# Patient Record
Sex: Female | Born: 1990 | Race: Black or African American | Hispanic: No | Marital: Single | Smoking: Never smoker
Health system: Southern US, Community
[De-identification: ages and names within clinical notes are randomized; demographics above are authoritative.]

## PROBLEM LIST (undated history)

## (undated) ENCOUNTER — Inpatient Hospital Stay (HOSPITAL_COMMUNITY): Payer: Self-pay

## (undated) ENCOUNTER — Emergency Department (HOSPITAL_COMMUNITY)

## (undated) DIAGNOSIS — G43909 Migraine, unspecified, not intractable, without status migrainosus: Secondary | ICD-10-CM

## (undated) DIAGNOSIS — D649 Anemia, unspecified: Secondary | ICD-10-CM

## (undated) DIAGNOSIS — K219 Gastro-esophageal reflux disease without esophagitis: Secondary | ICD-10-CM

## (undated) DIAGNOSIS — T7840XA Allergy, unspecified, initial encounter: Secondary | ICD-10-CM

## (undated) HISTORY — PX: TONSILLECTOMY: SUR1361

## (undated) HISTORY — DX: Gastro-esophageal reflux disease without esophagitis: K21.9

## (undated) HISTORY — DX: Anemia, unspecified: D64.9

## (undated) HISTORY — DX: Allergy, unspecified, initial encounter: T78.40XA

---

## 2010-05-19 ENCOUNTER — Other Ambulatory Visit: Admission: RE | Admit: 2010-05-19 | Discharge: 2010-05-19 | Payer: Self-pay | Admitting: Family Medicine

## 2010-05-19 ENCOUNTER — Ambulatory Visit: Payer: Self-pay | Admitting: Family Medicine

## 2010-05-19 DIAGNOSIS — D649 Anemia, unspecified: Secondary | ICD-10-CM

## 2010-05-20 LAB — CONVERTED CEMR LAB
AST: 17 units/L (ref 0–37)
Albumin: 4.4 g/dL (ref 3.5–5.2)
BUN: 13 mg/dL (ref 6–23)
Basophils Absolute: 0 10*3/uL (ref 0.0–0.1)
CO2: 25 meq/L (ref 19–32)
Cholesterol: 145 mg/dL (ref 0–200)
Eosinophils Absolute: 0.1 10*3/uL (ref 0.0–0.7)
GFR calc non Af Amer: 100.05 mL/min (ref >60)
Glucose, Bld: 81 mg/dL (ref 70–99)
HCT: 32.8 % — ABNORMAL LOW (ref 36.0–46.0)
HDL: 48.7 mg/dL (ref >39.00)
HIV: NONREACTIVE
Lymphs Abs: 1.9 10*3/uL (ref 0.7–4.0)
MCHC: 34.3 g/dL (ref 30.0–36.0)
Monocytes Absolute: 0.3 10*3/uL (ref 0.1–1.0)
Monocytes Relative: 7.1 % (ref 3.0–12.0)
Platelets: 213 10*3/uL (ref 150.0–400.0)
Potassium: 4 meq/L (ref 3.5–5.1)
RDW: 13 % (ref 11.5–14.6)
RPR Ser Ql: REACTIVE — AB
RPR Titer: 1:2 {titer}
T pallidum Antibodies (TP-PA): 0.04 (ref <0.90)
TSH: 1.25 microintl units/mL (ref 0.35–5.50)
Total Bilirubin: 0.6 mg/dL (ref 0.3–1.2)
VLDL: 12.8 mg/dL (ref 0.0–40.0)

## 2010-05-23 ENCOUNTER — Telehealth (INDEPENDENT_AMBULATORY_CARE_PROVIDER_SITE_OTHER): Payer: Self-pay | Admitting: *Deleted

## 2010-06-01 ENCOUNTER — Telehealth (INDEPENDENT_AMBULATORY_CARE_PROVIDER_SITE_OTHER): Payer: Self-pay | Admitting: *Deleted

## 2010-06-06 ENCOUNTER — Encounter: Payer: Self-pay | Admitting: Family Medicine

## 2010-08-01 ENCOUNTER — Encounter: Payer: Self-pay | Admitting: Family Medicine

## 2010-08-01 ENCOUNTER — Ambulatory Visit
Admission: RE | Admit: 2010-08-01 | Discharge: 2010-08-01 | Payer: Self-pay | Source: Home / Self Care | Attending: Family Medicine | Admitting: Family Medicine

## 2010-08-01 DIAGNOSIS — B359 Dermatophytosis, unspecified: Secondary | ICD-10-CM | POA: Insufficient documentation

## 2010-08-01 DIAGNOSIS — N760 Acute vaginitis: Secondary | ICD-10-CM | POA: Insufficient documentation

## 2010-08-02 LAB — CONVERTED CEMR LAB: GC Probe Amp, Genital: NEGATIVE

## 2010-08-02 NOTE — Progress Notes (Signed)
Summary: pap results  Phone Note Outgoing Call   Call placed by: Doristine Devoid CMA,  June 01, 2010 3:06 PM Call placed to: Patient Summary of Call: pap normal but she has chlamydia.  needs to take Azithromycin 1 gram (2 500mg  pills) x1 dose.  partner needs treatment and should refrain from sex for 7 days during tx.  Follow-up for Phone Call        left message on machine ........Marland KitchenDoristine Devoid CMA  June 01, 2010 3:06 PM   left message on machine .........Marland KitchenDoristine Devoid CMA  June 02, 2010 4:12 PM   spoke w/ patient aware of pap results ..........Marland KitchenDoristine Devoid CMA  June 06, 2010 1:20 PM     New/Updated Medications: AZITHROMYCIN 500 MG TABS (AZITHROMYCIN) take 2 tablets daily Prescriptions: AZITHROMYCIN 500 MG TABS (AZITHROMYCIN) take 2 tablets daily  #2 x 0   Entered by:   Doristine Devoid CMA   Authorized by:   Neena Rhymes MD   Signed by:   Doristine Devoid CMA on 06/06/2010   Method used:   Electronically to        [facility] [phone]* (retail)       [address]       [place], [place]  [address]       Ph: [phone]       Fax: [phone]   RxID:   [id number]

## 2010-08-02 NOTE — Assessment & Plan Note (Signed)
Summary: NEW TO ESTAB/CBS   Vital Signs:  Patient profile:   20 year old female Height:      68 inches Weight:      145 pounds BMI:     22.13 Pulse rate:   79 / minute BP sitting:   104 / 60  (left arm)  Vitals Entered By: Doristine Devoid CMA (May 19, 2010 10:03 AM) CC: NEW EST- CPX AND PAP   History of Present Illness: 20 yo woman here today to establish care.  previous MD- Vilinda Boehringer.  no concerns today.  would like to switch OCPs due to breakthrough spotting.  Preventive Screening-Counseling & Management  Alcohol-Tobacco     Alcohol drinks/day: 0     Smoking Status: never  Caffeine-Diet-Exercise     Does Patient Exercise: no      Drug Use:  never.    Current Medications (verified): 1)  Sprintec 28 0.25-35 Mg-Mcg Tabs (Norgestimate-Eth Estradiol) .Marland Kitchen.. 1 Tab By Mouth Daily  Allergies (verified): 1)  ! Sulfa  Past History:  Past Medical History: Anemia-NOS  Past Surgical History: none  Family History: CAD-no HTN-no DM-grandparent STROKE COLON CA-no BREAST CA-grandmother dx'd late 35s.  Social History: student at [employer]- [place] lives aloneSmoking Status:  never Does Patient Exercise:  no Drug Use:  never  Review of Systems       The patient complains of headaches.  The patient denies anorexia, fever, weight loss, weight gain, vision loss, decreased hearing, hoarseness, chest pain, syncope, dyspnea on exertion, peripheral edema, prolonged cough, abdominal pain, melena, hematochezia, severe indigestion/heartburn, hematuria, suspicious skin lesions, depression, abnormal bleeding, enlarged lymph nodes, and breast masses.         HAs- pt feels this is stress related, also has allergies  Physical Exam  General:  Well-developed,well-nourished,in no acute distress; alert,appropriate and cooperative throughout examination Head:  Normocephalic and atraumatic without obvious abnormalities. No apparent alopecia or balding. Eyes:   No corneal or conjunctival inflammation noted. EOMI. Perrla. Funduscopic exam benign, without hemorrhages, exudates or papilledema. Vision grossly normal. Ears:  External ear exam shows no significant lesions or deformities.  Otoscopic examination reveals clear canals, tympanic membranes are intact bilaterally without bulging, retraction, inflammation or discharge. Hearing is grossly normal bilaterally. Nose:  + allergic crease w/ markedly edematous turbinates Mouth:  Oral mucosa and oropharynx without lesions or exudates.  Teeth in good repair. Neck:  No deformities, masses, or tenderness noted. Breasts:  No mass, nodules, thickening, tenderness, bulging, retraction, inflamation, nipple discharge or skin changes noted.   Lungs:  Normal respiratory effort, chest expands symmetrically. Lungs are clear to auscultation, no crackles or wheezes. Heart:  Normal rate and regular rhythm. S1 and S2 normal without gallop, murmur, click, rub or other extra sounds. Abdomen:  Bowel sounds positive,abdomen soft and non-tender without masses, organomegaly or hernias noted. Genitalia:  Pelvic Exam:        External: normal female genitalia without lesions or masses        Vagina: normal without lesions or masses        Cervix: normal without lesions or masses        Adnexa: normal bimanual exam without masses or fullness        Uterus: normal by palpation        Pap smear: performed Pulses:  +2 carotid, radial, DP Extremities:  No clubbing, cyanosis, edema, or deformity noted with normal full range of motion of all joints.   Neurologic:  No cranial nerve deficits noted.  Station and gait are normal. Plantar reflexes are down-going bilaterally. DTRs are symmetrical throughout. Sensory, motor and coordinative functions appear intact. Skin:  Intact without suspicious lesions or rashes Cervical Nodes:  No lymphadenopathy noted Axillary Nodes:  No palpable lymphadenopathy Psych:  Cognition and judgment appear intact.  Alert and cooperative with normal attention span and concentration. No apparent delusions, illusions, hallucinations   Impression & Recommendations:  Problem # 1:  ROUTINE GYNECOLOGICAL EXAMINATION (ICD-V72.31) Assessment New pt's PE WNL.  check labs.  anticipatory guidance provided. Orders: Venipuncture ([address]) TLB-Lipid Panel (80061-LIPID) TLB-BMP (Basic Metabolic Panel-BMET) (80048-METABOL) TLB-CBC Platelet - w/Differential (85025-CBCD) TLB-Hepatic/Liver Function Pnl (80076-HEPATIC) TLB-TSH (Thyroid Stimulating Hormone) (84443-TSH) T-Vitamin D (25-Hydroxy) ([address]-09811) Specimen Handling (91478)  Problem # 2:  SCREENING FOR MALIGNANT NEOPLASM OF THE CERVIX (ICD-V76.2) Assessment: New pap collected.  Problem # 3:  CONTACT OR EXPOSURE TO OTHER VIRAL DISEASES (ICD-V01.79) Assessment: New check labs at pt's request.  stressed the importance of safe sex Orders: T-RPR (Syphilis) (29562-13086) T-HIV Antibody  (Reflex) ([id number]) Specimen Handling (28413)  Complete Medication List: 1)  Sprintec 28 0.25-35 Mg-mcg Tabs (Norgestimate-eth estradiol) .Marland Kitchen.. 1 tab by mouth daily  Patient Instructions: 1)  Follow up in 1 year or as needed 2)  Your exam looks great!  Keep up the good work! 3)  We'll notify you of your lab results 4)  Start Zyrtec (or store brand generic) daily for your allergy congestion 5)  Call with any questions or concerns 6)  Happy Holidays!! Prescriptions: SPRINTEC 28 0.25-35 MG-MCG TABS (NORGESTIMATE-ETH ESTRADIOL) 1 tab by mouth daily  #1 pack x 11   Entered and Authorized by:   Neena Rhymes MD   Signed by:   Neena Rhymes MD on 05/19/2010   Method used:   Electronically to        [facility]. [phone]* (retail)       [address]       [place], [place]  [address]       Ph: [phone]       Fax: [phone]   RxID:   [id number]    Orders Added: 1)  Venipuncture [01601] 2)  TLB-Lipid Panel  [80061-LIPID] 3)  TLB-BMP (Basic Metabolic Panel-BMET) [80048-METABOL] 4)  TLB-CBC Platelet - w/Differential [85025-CBCD] 5)  TLB-Hepatic/Liver Function Pnl [80076-HEPATIC] 6)  TLB-TSH (Thyroid Stimulating Hormone) [84443-TSH] 7)  T-Vitamin D (25-Hydroxy) [09323-55732] 8)  T-RPR (Syphilis) [20254-27062] 9)  T-HIV Antibody  (Reflex) [[id number]] 10)  Specimen Handling [99000] 11)  New Patient 18-39 years [99385]

## 2010-08-02 NOTE — Progress Notes (Signed)
Summary: labs  Phone Note Outgoing Call   Call placed by: Doristine Devoid CMA,  May 23, 2010 3:11 PM Call placed to: Patient Summary of Call: vit D level is low.  needs to start 50,000 units weekly x12 weeks and then recheck level.  also needs to start daily caltrate + D supplement  Follow-up for Phone Call        spoke w/ patient aware of labs and that prescription to be called into pharmacy .......Marland KitchenDoristine Devoid CMA  May 23, 2010 3:12 PM     New/Updated Medications: VITAMIN D (ERGOCALCIFEROL) 50000 UNIT CAPS (ERGOCALCIFEROL) take one tablet daily x12 weeks Prescriptions: VITAMIN D (ERGOCALCIFEROL) 50000 UNIT CAPS (ERGOCALCIFEROL) take one tablet daily x12 weeks  #12 x 0   Entered by:   Doristine Devoid CMA   Authorized by:   Neena Rhymes MD   Signed by:   Doristine Devoid CMA on 05/23/2010   Method used:   Electronically to        [facility]. [phone]* (retail)       [address]       [place], [place]  [address]       Ph: [phone]       Fax: [phone]   RxID:   [id number]

## 2010-08-03 ENCOUNTER — Telehealth (INDEPENDENT_AMBULATORY_CARE_PROVIDER_SITE_OTHER): Payer: Self-pay | Admitting: *Deleted

## 2010-08-04 NOTE — Letter (Signed)
Summary: Communicable Disease Form/[facility]  Communicable Disease Form/[facility]   Imported By: Lanelle Bal 06/13/2010 12:19:29  _____________________________________________________________________  External Attachment:    Type:   Image     Comment:   External Document

## 2010-08-10 NOTE — Progress Notes (Signed)
Summary: wet prep results  Phone Note Outgoing Call   Call placed by: Doristine Devoid CMA,  August 03, 2010 8:57 AM Call placed to: Patient Summary of Call: + for BV.  needs to start Metronidazole 500mg  two times a day x7 days.  this is NOT sexually transmitted but is the cause of her vaginal d/c    Follow-up for Phone Call        Left message to call office .Marland KitchenMarland KitchenMarland KitchenSigned by Jeremy Johann CMA on 08/02/2010 at 5:12 PM  spoke w/ patient aware of results...Marland KitchenMarland KitchenDoristine Devoid CMA  August 03, 2010 9:44 AM     New/Updated Medications: METRONIDAZOLE 500 MG TABS (METRONIDAZOLE) take one tablet two times a day x7 days Prescriptions: METRONIDAZOLE 500 MG TABS (METRONIDAZOLE) take one tablet two times a day x7 days  #14 x 0   Entered by:   Doristine Devoid CMA   Authorized by:   Neena Rhymes MD   Signed by:   Doristine Devoid CMA on 08/03/2010   Method used:   Electronically to        [facility]. [phone]* (retail)       [address]       [place], [place]  [address]       Ph: [phone]       Fax: [phone]   RxID:   [id number]

## 2010-08-10 NOTE — Assessment & Plan Note (Signed)
Summary: rash on arms, back and stomach///sph   Vital Signs:  Patient profile:   20 year old female Weight:      142 pounds BMI:     21.67 Temp:     98.2 degrees F oral BP sitting:   110 / 60  (left arm)  Vitals Entered By: Doristine Devoid CMA (August 01, 2010 2:08 PM) CC: rash on arms and back also having vaginal discharge     History of Present Illness: 20 yo woman here today for  1) rash- has papular rash on both arms, back, abd.  + itching- using cortizone w/ some relief.  none on legs or groin.  none on hands or feet.  no changes in soaps or detergents.  no animal exposure.  sxs appeared 1 week ago.  has not slept anywhere new or different.  no hx of similar  2) vaginal d/c- discharge is Sessa, 'gooey' egg Floyd consistency.  pt is at midpoint of cycle.  no itching or burning.  discharge is intermittant x2 weeks.  would like checked for STDs- has + hx.  Current Medications (verified): 1)  Sprintec 28 0.25-35 Mg-Mcg Tabs (Norgestimate-Eth Estradiol) .Marland Kitchen.. 1 Tab By Mouth Daily 2)  Vitamin D (Ergocalciferol) 50000 Unit Caps (Ergocalciferol) .... Take One Tablet Daily X12 Weeks  Allergies (verified): 1)  ! Sulfa  Review of Systems      See HPI  Physical Exam  General:  Well-developed,well-nourished,in no acute distress; alert,appropriate and cooperative throughout examination Genitalia:  normal introitus and no external lesions, Severns vaginal d/c, inflammed appearing cervix.   Skin:  circular lesions consistent w/ tinea on trunk and bilateral arms   Impression & Recommendations:  Problem # 1:  RINGWORM (ICD-110.9) Assessment New pt's rash consistent w/ ringworm.  start OTC antifungal two times a day.  Problem # 2:  VAGINITIS (ICD-616.10) Assessment: New check GC/CT probe and wet prep.  will hold tx until results available. The following medications were removed from the medication list:    Azithromycin 500 Mg Tabs (Azithromycin) .Marland Kitchen... Take 2 tablets  daily  Orders: Specimen Handling ([address]) T-Chlamydia Probe, genital [phone]) T-GC Probe, genital [phone]) T-Wet Prep (13086-57846)  Complete Medication List: 1)  Sprintec 28 0.25-35 Mg-mcg Tabs (Norgestimate-eth estradiol) .Marland Kitchen.. 1 tab by mouth daily 2)  Vitamin D (ergocalciferol) 50000 Unit Caps (Ergocalciferol) .... Take one tablet daily x12 weeks  Patient Instructions: 1)  Your rash appears to be a fungal infection- it's hard to say where you got it but we can treat it w/ OTC Clotrimazole cream two times a day 2)  We'll notify you of your lab results and treat if needed 3)  Make sure you are practicing safe sex! 4)  Call with any questions or concerns 5)  Happy New Year!   Orders Added: 1)  Specimen Handling [99000] 2)  T-Chlamydia Probe, genital [96295-28413] 3)  T-GC Probe, genital [24401-02725] 4)  T-Wet Prep [36644-03474] 5)  Est. Patient Level III [25956]

## 2010-12-01 ENCOUNTER — Encounter: Payer: Self-pay | Admitting: Family Medicine

## 2010-12-01 ENCOUNTER — Ambulatory Visit (INDEPENDENT_AMBULATORY_CARE_PROVIDER_SITE_OTHER): Payer: Managed Care, Other (non HMO) | Admitting: Family Medicine

## 2010-12-01 DIAGNOSIS — N760 Acute vaginitis: Secondary | ICD-10-CM

## 2010-12-01 DIAGNOSIS — Z20828 Contact with and (suspected) exposure to other viral communicable diseases: Secondary | ICD-10-CM | POA: Insufficient documentation

## 2010-12-01 DIAGNOSIS — F419 Anxiety disorder, unspecified: Secondary | ICD-10-CM | POA: Insufficient documentation

## 2010-12-01 DIAGNOSIS — F411 Generalized anxiety disorder: Secondary | ICD-10-CM

## 2010-12-01 MED ORDER — FLUCONAZOLE 150 MG PO TABS: 150.0000 mg | ORAL_TABLET | Freq: Once | ORAL | Status: DC

## 2010-12-01 MED ORDER — BUSPIRONE HCL 15 MG PO TABS: 15.0000 mg | ORAL_TABLET | Freq: Two times a day (BID) | ORAL | Status: DC

## 2010-12-01 NOTE — Progress Notes (Signed)
  Subjective:    Patient ID: Connie Cobb, female    DOB: May 07, 1991, 20 y.o.   MRN: [id number]  HPI  Anxiety- is currently in nursing school.  Legs will shake.  Having poor sleep, difficulty falling asleep- 'can't turn my brain off'.  Has never had anxiety previously.  Denies sadness, crying.  Feels 'tired a lot'.  Vaginal d/c- reports this is similar to last time.  Last time had BV.  Took OTC pH test and this reported pH is normal.  Reports d/c is 'milky Judson'.  Denies itching.  Would like STD testing  Review of Systems For ROS see HPI     Objective:   Physical Exam  Constitutional: She appears well-developed and well-nourished.       Mildly anxious  Neck: Neck supple. No thyromegaly present.  Genitourinary: There is no rash, tenderness or lesion on the right labia. There is no rash, tenderness or lesion on the left labia. Uterus is not tender. Cervix exhibits no motion tenderness, no discharge and no friability. Right adnexum displays no tenderness. Left adnexum displays no tenderness. Vaginal discharge (thick, [place], clumping- sticking to vaginal walls (appears consistent w/ yeast)) found.  Psychiatric:       Anxious, restless, trouble making eye contact at times          Assessment & Plan:

## 2010-12-01 NOTE — Patient Instructions (Signed)
Follow up in 1 month to recheck anxiety Start the Buspar twice a day- start w/ 1/2 tab twice a day for 2 weeks and then increase to 1 tab twice daily Take the Diflucan for suspected yeast- we'll notify you of your lab results Call with any questions or concerns Hang in there!

## 2010-12-02 ENCOUNTER — Encounter: Payer: Self-pay | Admitting: *Deleted

## 2010-12-02 LAB — WET PREP BY MOLECULAR PROBE
Candida species: NEGATIVE
Trichomonas vaginosis: NEGATIVE

## 2010-12-02 LAB — GC/CHLAMYDIA PROBE AMP, GENITAL
Chlamydia, DNA Probe: NEGATIVE
GC Probe Amp, Genital: NEGATIVE

## 2010-12-04 NOTE — Assessment & Plan Note (Signed)
Check labs at pt's request.  Pt reports using condoms w/ each encounter but is very concerned about this.

## 2010-12-04 NOTE — Assessment & Plan Note (Signed)
Pt appears anxious in office today and admits to being overwhelmed w/ school.  Not sleeping well.  Will start buspar for anxiety and follow closely.  Pt expressed understanding and is in agreement w/ plan.

## 2010-12-04 NOTE — Assessment & Plan Note (Signed)
Pt's vaginal dc appears consistent w/ yeast- start diflucan while waiting on wet prep.

## 2010-12-27 ENCOUNTER — Ambulatory Visit (INDEPENDENT_AMBULATORY_CARE_PROVIDER_SITE_OTHER): Payer: Managed Care, Other (non HMO) | Admitting: Family Medicine

## 2010-12-27 DIAGNOSIS — F419 Anxiety disorder, unspecified: Secondary | ICD-10-CM

## 2010-12-27 DIAGNOSIS — N760 Acute vaginitis: Secondary | ICD-10-CM

## 2010-12-27 DIAGNOSIS — F411 Generalized anxiety disorder: Secondary | ICD-10-CM

## 2010-12-27 MED ORDER — FLUCONAZOLE 150 MG PO TABS: 150.0000 mg | ORAL_TABLET | Freq: Once | ORAL | Status: DC

## 2010-12-27 NOTE — Patient Instructions (Signed)
This appears to be yeast Take the Diflucan for the yeast and then repeat in 3 days if you still have symptoms Call with any questions or concerns Have a great summer!

## 2010-12-27 NOTE — Progress Notes (Signed)
  Subjective:    Patient ID: Connie Cobb, female    DOB: October 06, 1990, 20 y.o.   MRN: [id number]  HPI Vaginal discharge- reports discharge returned 3 days ago.  Now appears like 'rolled up toilet paper balls'.  Denies odor, itching.  Has some vaginal dryness.  Due for period next week.  Anxiety- sxs have improved since starting Buspar.  Sleeping better.  Now feels able to manage her stress- 'way better than I was before'.   Review of Systems For ROS see HPI     Objective:   Physical Exam  Constitutional: She appears well-developed and well-nourished. No distress.  Genitourinary: There is no rash or tenderness on the right labia. There is no rash or tenderness on the left labia. Cervix exhibits no motion tenderness, no discharge and no friability. No erythema, tenderness or bleeding around the vagina. Vaginal discharge (thick, clumpy [place] d/c) found.  Psychiatric: She has a normal mood and affect. Her behavior is normal. Judgment and thought content normal.          Assessment & Plan:

## 2010-12-27 NOTE — Assessment & Plan Note (Signed)
Improved since starting Buspar.  Will continue.

## 2010-12-27 NOTE — Assessment & Plan Note (Signed)
Pt's PE consistent w/ yeast.  Send wet prep for confirmation.  tx w/ Diflucan.

## 2010-12-28 LAB — WET PREP BY MOLECULAR PROBE: Gardnerella vaginalis: NEGATIVE

## 2011-01-26 ENCOUNTER — Telehealth: Payer: Self-pay | Admitting: Family Medicine

## 2011-01-26 MED ORDER — FLUCONAZOLE 150 MG PO TABS: 150.0000 mg | ORAL_TABLET | Freq: Once | ORAL | Status: AC

## 2011-01-26 NOTE — Telephone Encounter (Signed)
Pt called was seen for yeast infection on 6/26 and says that symptoms have returned and would like to have another rx for diflucan.

## 2011-01-26 NOTE — Telephone Encounter (Signed)
Ok to give 2 pills

## 2011-01-26 NOTE — Telephone Encounter (Signed)
Pt.notified

## 2011-03-27 ENCOUNTER — Ambulatory Visit (INDEPENDENT_AMBULATORY_CARE_PROVIDER_SITE_OTHER): Payer: Managed Care, Other (non HMO) | Admitting: Family Medicine

## 2011-03-27 ENCOUNTER — Encounter: Payer: Self-pay | Admitting: Family Medicine

## 2011-03-27 DIAGNOSIS — Z23 Encounter for immunization: Secondary | ICD-10-CM

## 2011-03-27 DIAGNOSIS — N760 Acute vaginitis: Secondary | ICD-10-CM

## 2011-03-27 MED ORDER — NORGESTIMATE-ETH ESTRADIOL 0.25-35 MG-MCG PO TABS
1.0000 | ORAL_TABLET | Freq: Every day | ORAL | Status: DC
Start: 2011-03-27 — End: 2011-05-01

## 2011-03-27 NOTE — Patient Instructions (Signed)
We'll notify you of your lab results and treat as needed Call with any questions or concerns Your period is starting- be prepared Hang in there!

## 2011-03-27 NOTE — Progress Notes (Signed)
  Subjective:    Patient ID: Connie Cobb, female    DOB: [birth year], 20 y.o.   MRN: [id number]  HPI Vaginal d/c- reports ongoing discharge.  Thick, 'milky' d/c.  No itching, no pain.  Due for period.  Needs flu shot   Review of Systems For ROS see HPI     Objective:   Physical Exam  Vitals reviewed. Constitutional: She appears well-developed and well-nourished. No distress.  Genitourinary: No erythema or tenderness around the vagina. Vaginal discharge (Blowers, thick, no odor) found.          Assessment & Plan:

## 2011-03-28 NOTE — Assessment & Plan Note (Signed)
Pt again having vaginal d/c.  Will await results of wet prep prior to treating.  Pt expressed understanding and is in agreement w/ plan.

## 2011-03-31 ENCOUNTER — Telehealth: Payer: Self-pay

## 2011-03-31 NOTE — Telephone Encounter (Signed)
Pt notified vaginal swab negative for infection

## 2011-05-01 ENCOUNTER — Other Ambulatory Visit: Payer: Self-pay | Admitting: Family Medicine

## 2011-05-01 MED ORDER — NORGESTIMATE-ETH ESTRADIOL 0.25-35 MG-MCG PO TABS: 1.0000 | ORAL_TABLET | Freq: Every day | ORAL | Status: DC

## 2011-05-01 NOTE — Telephone Encounter (Signed)
rx sent to pharmacy by e-script  

## 2011-05-22 ENCOUNTER — Ambulatory Visit (INDEPENDENT_AMBULATORY_CARE_PROVIDER_SITE_OTHER): Payer: Managed Care, Other (non HMO) | Admitting: Family Medicine

## 2011-05-22 ENCOUNTER — Other Ambulatory Visit (HOSPITAL_COMMUNITY)
Admission: RE | Admit: 2011-05-22 | Discharge: 2011-05-22 | Disposition: A | Discharge status: Home / Self Care | Payer: Managed Care, Other (non HMO) | Source: Ambulatory Visit | Attending: Family Medicine | Admitting: Family Medicine

## 2011-05-22 ENCOUNTER — Encounter: Payer: Self-pay | Admitting: Family Medicine

## 2011-05-22 DIAGNOSIS — Z803 Family history of malignant neoplasm of breast: Secondary | ICD-10-CM | POA: Insufficient documentation

## 2011-05-22 DIAGNOSIS — Z20828 Contact with and (suspected) exposure to other viral communicable diseases: Secondary | ICD-10-CM

## 2011-05-22 DIAGNOSIS — N760 Acute vaginitis: Secondary | ICD-10-CM | POA: Insufficient documentation

## 2011-05-22 DIAGNOSIS — Z113 Encounter for screening for infections with a predominantly sexual mode of transmission: Secondary | ICD-10-CM | POA: Insufficient documentation

## 2011-05-22 DIAGNOSIS — Z01419 Encounter for gynecological examination (general) (routine) without abnormal findings: Secondary | ICD-10-CM | POA: Insufficient documentation

## 2011-05-22 DIAGNOSIS — R87619 Unspecified abnormal cytological findings in specimens from cervix uteri: Secondary | ICD-10-CM

## 2011-05-22 DIAGNOSIS — Z124 Encounter for screening for malignant neoplasm of cervix: Secondary | ICD-10-CM | POA: Insufficient documentation

## 2011-05-22 LAB — CBC WITH DIFFERENTIAL/PLATELET
Basophils Absolute: 0 10*3/uL (ref 0.0–0.1)
HCT: 35 % — ABNORMAL LOW (ref 36.0–46.0)
Hemoglobin: 11.9 g/dL — ABNORMAL LOW (ref 12.0–15.0)
Lymphs Abs: 1.4 10*3/uL (ref 0.7–4.0)
MCHC: 33.9 g/dL (ref 30.0–36.0)
MCV: 84.3 fl (ref 78.0–100.0)
Monocytes Absolute: 0.3 10*3/uL (ref 0.1–1.0)
Monocytes Relative: 6.5 % (ref 3.0–12.0)
Neutro Abs: 2.7 10*3/uL (ref 1.4–7.7)
Platelets: 230 10*3/uL (ref 150.0–400.0)
RDW: 13.7 % (ref 11.5–14.6)

## 2011-05-22 LAB — LIPID PANEL
HDL: 53.1 mg/dL (ref >39.00)
Triglycerides: 70 mg/dL (ref 0.0–149.0)

## 2011-05-22 LAB — HEPATIC FUNCTION PANEL
Albumin: 4.3 g/dL (ref 3.5–5.2)
Bilirubin, Direct: 0 mg/dL (ref 0.0–0.3)
Total Protein: 7.3 g/dL (ref 6.0–8.3)

## 2011-05-22 LAB — BASIC METABOLIC PANEL
CO2: 24 mEq/L (ref 19–32)
Calcium: 9.3 mg/dL (ref 8.4–10.5)
Creatinine, Ser: 0.8 mg/dL (ref 0.4–1.2)
GFR: 93.51 mL/min (ref >60.00)
Sodium: 141 mEq/L (ref 135–145)

## 2011-05-22 NOTE — Assessment & Plan Note (Signed)
Pap collected. 

## 2011-05-22 NOTE — Assessment & Plan Note (Signed)
Given pt's family hx- maternal grandmother, multiple aunts, few cousins (youngest dx'd in early 36s)- will refer for screening mammo.  Pt expressed understanding and is in agreement w/ plan.

## 2011-05-22 NOTE — Progress Notes (Signed)
  Subjective:    Patient ID: Connie Cobb, female    DOB: 19-Nov-1990, 20 y.o.   MRN: [id number]  HPI CPE- menses started on 11th, having intermittent bleeding.  Missed pill on Saturday and period started.   Review of Systems Patient reports no vision/ hearing changes, adenopathy,fever, weight change,  persistant/recurrent hoarseness , swallowing issues, chest pain, palpitations, edema, persistant/recurrent cough, hemoptysis, dyspnea (rest/exertional/paroxysmal nocturnal), gastrointestinal bleeding (melena, rectal bleeding), abdominal pain, significant heartburn, bowel changes, GU symptoms (dysuria, hematuria, incontinence), Gyn symptoms (abnormal  bleeding, pain),  syncope, focal weakness, memory loss, numbness & tingling, skin/hair/nail changes, abnormal bruising or bleeding, anxiety, or depression.     Objective:   Physical Exam  General Appearance:    Alert, cooperative, no distress, appears stated age  Head:    Normocephalic, without obvious abnormality, atraumatic  Eyes:    PERRL, conjunctiva/corneas clear, EOM's intact, fundi    benign, both eyes  Ears:    Normal TM's and external ear canals, both ears  Nose:   Nares normal, septum midline, mucosa normal, no drainage    or sinus tenderness  Throat:   Lips, mucosa, and tongue normal; teeth and gums normal  Neck:   Supple, symmetrical, trachea midline, no adenopathy;    Thyroid: no enlargement/tenderness/nodules  Back:     Symmetric, no curvature, ROM normal, no CVA tenderness  Lungs:     Clear to auscultation bilaterally, respirations unlabored  Chest Wall:    No tenderness or deformity   Heart:    Regular rate and rhythm, S1 and S2 normal, no murmur, rub   or gallop  Breast Exam:    No tenderness, masses, or nipple abnormality  Abdomen:     Soft, non-tender, bowel sounds active all four quadrants,    no masses, no organomegaly  Genitalia:    External genitalia normal, cervix normal in appearance, no CMT, uterus in normal size and  position, adnexa w/out mass or tenderness, mucosa pink and moist, no lesions or discharge present  Rectal:    Normal external appearance  Extremities:   Extremities normal, atraumatic, no cyanosis or edema  Pulses:   2+ and symmetric all extremities  Skin:   Skin color, texture, turgor normal, no rashes or lesions  Lymph nodes:   Cervical, supraclavicular, and axillary nodes normal  Neurologic:   CNII-XII intact, normal strength, sensation and reflexes    throughout          Assessment & Plan:

## 2011-05-22 NOTE — Patient Instructions (Signed)
Follow up in 1 year or as needed We'll notify you of your lab results Keep up the good work on diet and exercise- you look great Restart your pills on Sunday as usual Call with any questions or concerns Happy Holidays!

## 2011-05-22 NOTE — Assessment & Plan Note (Signed)
Check labs at pt's request 

## 2011-05-22 NOTE — Assessment & Plan Note (Signed)
Pt's PE WNL.  Check labs.  Anticipatory guidance provided.  

## 2011-05-23 LAB — T.PALLIDUM AB, TOTAL: T pallidum Antibodies (TP-PA): 0.03 S/CO (ref <0.90)

## 2011-05-23 LAB — HIV ANTIBODY (ROUTINE TESTING W REFLEX): HIV: NONREACTIVE

## 2011-05-23 LAB — RPR: RPR Ser Ql: REACTIVE — AB

## 2011-05-24 LAB — VITAMIN D 1,25 DIHYDROXY
Vitamin D 1, 25 (OH)2 Total: 81 pg/mL — ABNORMAL HIGH (ref 18–72)
Vitamin D2 1, 25 (OH)2: 11 pg/mL

## 2011-05-30 ENCOUNTER — Encounter: Payer: Self-pay | Admitting: *Deleted

## 2011-05-30 ENCOUNTER — Telehealth: Payer: Self-pay | Admitting: *Deleted

## 2011-05-30 NOTE — Progress Notes (Signed)
Addended by: Derry Lory A on: 05/30/2011 01:22 PM   Modules accepted: Orders

## 2011-05-30 NOTE — Telephone Encounter (Signed)
Spoke to pt about results of Vit D pt understood. Advised that she has an abnormal pap smear and does not have a GYN. I placed an order for a referral to a gyn for pt, advised that someone will be calling for her appt for gyn. Pt still concerned that her flu shot gave her a false positive for the syphillis. I advised that MD Beverely Low doubted that the flu shot will effect the results. Pt understood. Mailed letter will all results/instructions for the Vit D/Pap Smear and Blood Work to her home address.

## 2011-06-08 ENCOUNTER — Other Ambulatory Visit: Payer: Self-pay | Admitting: Obstetrics and Gynecology

## 2011-08-16 ENCOUNTER — Other Ambulatory Visit: Payer: Self-pay | Admitting: Family Medicine

## 2011-08-17 MED ORDER — NORGESTIMATE-ETH ESTRADIOL 0.25-35 MG-MCG PO TABS: 1.0000 | ORAL_TABLET | Freq: Every day | ORAL | Status: DC

## 2011-08-17 NOTE — Telephone Encounter (Signed)
rx sent to pharmacy by e-script  

## 2011-08-21 ENCOUNTER — Encounter: Payer: Self-pay | Admitting: Family Medicine

## 2011-08-21 ENCOUNTER — Ambulatory Visit (INDEPENDENT_AMBULATORY_CARE_PROVIDER_SITE_OTHER): Payer: BC Managed Care – PPO | Admitting: Family Medicine

## 2011-08-21 VITALS — BP 110/80 | HR 110 | Temp 98.5°F | Ht 67.5 in | Wt 142.2 lb

## 2011-08-21 DIAGNOSIS — F419 Anxiety disorder, unspecified: Secondary | ICD-10-CM

## 2011-08-21 DIAGNOSIS — J329 Chronic sinusitis, unspecified: Secondary | ICD-10-CM | POA: Insufficient documentation

## 2011-08-21 DIAGNOSIS — J309 Allergic rhinitis, unspecified: Secondary | ICD-10-CM

## 2011-08-21 DIAGNOSIS — J302 Other seasonal allergic rhinitis: Secondary | ICD-10-CM | POA: Insufficient documentation

## 2011-08-21 DIAGNOSIS — R350 Frequency of micturition: Secondary | ICD-10-CM

## 2011-08-21 DIAGNOSIS — N390 Urinary tract infection, site not specified: Secondary | ICD-10-CM

## 2011-08-21 DIAGNOSIS — F411 Generalized anxiety disorder: Secondary | ICD-10-CM

## 2011-08-21 DIAGNOSIS — IMO0001 Reserved for inherently not codable concepts without codable children: Secondary | ICD-10-CM

## 2011-08-21 LAB — POCT URINALYSIS DIPSTICK
Bilirubin, UA: NEGATIVE
Glucose, UA: NEGATIVE
Ketones, UA: NEGATIVE
Leukocytes, UA: NEGATIVE
Spec Grav, UA: 1.02

## 2011-08-21 MED ORDER — CITALOPRAM HYDROBROMIDE 20 MG PO TABS: 20.0000 mg | ORAL_TABLET | Freq: Every day | ORAL | Status: DC

## 2011-08-21 MED ORDER — MOMETASONE FUROATE 50 MCG/ACT NA SUSP: 2.0000 | Freq: Every day | NASAL | Status: DC

## 2011-08-21 MED ORDER — CIPROFLOXACIN HCL 500 MG PO TABS: 500.0000 mg | ORAL_TABLET | Freq: Two times a day (BID) | ORAL | Status: AC

## 2011-08-21 NOTE — Assessment & Plan Note (Signed)
Pt's sxs are consistent w/ UTI althought UA unremarkable.  Send for cx.  Start abx.  Reviewed supportive care and red flags that should prompt return.  Pt expressed understanding and is in agreement w/ plan.

## 2011-08-21 NOTE — Progress Notes (Signed)
  Subjective:    Patient ID: Connie Cobb, female    DOB: 1990/08/14, 21 y.o.   MRN: [id number]  HPI Urinary urgency- sxs started last week.  Felt the need to 'constantly' go the bathroom but would attempt to urinate and only 'get a drop'.  Urine had 'funny smell'.  No dysuria.  No fevers.  No back or bladder pain.  HA- will wake up w/ HAs, will improve w/ Excedrin Migraine.  sxs started in Jan.  HAs are frontal.  No phonophobia.  Minimal photophobia.  No nausea.  No ear pain.  + nasal congestion.  No tooth pain.  Anxiety- 'i had a little mental breakdown yesterday'.  buspar is causing dizziness but sxs are getting worse.   Review of Systems For ROS see HPI     Objective:   Physical Exam  Vitals reviewed. Constitutional: She appears well-developed and well-nourished. No distress.  HENT:  Head: Normocephalic and atraumatic.  Right Ear: Tympanic membrane normal.  Left Ear: Tympanic membrane normal.  Nose: Mucosal edema and rhinorrhea present. Right sinus exhibits maxillary sinus tenderness and frontal sinus tenderness. Left sinus exhibits maxillary sinus tenderness and frontal sinus tenderness.  Mouth/Throat: Uvula is midline and mucous membranes are normal. Posterior oropharyngeal erythema present. No oropharyngeal exudate.  Eyes: Conjunctivae and EOM are normal. Pupils are equal, round, and reactive to light.  Neck: Normal range of motion. Neck supple.  Cardiovascular: Normal rate, regular rhythm and normal heart sounds.   Pulmonary/Chest: Effort normal and breath sounds normal. No respiratory distress. She has no wheezes.  Abdominal: Soft. Bowel sounds are normal. She exhibits no distension. There is no tenderness. There is no rebound and no guarding.  Lymphadenopathy:    She has no cervical adenopathy.          Assessment & Plan:

## 2011-08-21 NOTE — Assessment & Plan Note (Signed)
Deteriorated.  Stop buspar.  Start SSRI.  Will follow closely.

## 2011-08-21 NOTE — Patient Instructions (Signed)
Follow up in 1 month to recheck mood This appears to be a sinus infection, bladder infection, and allergy combo Start the Cipro for the infection (take w/ food) After 5 days of Cipro, start the Celexa for the anxiety Start the Nasonex- 2 sprays each nostril daily Drink plenty of fluids Call with any questions or concerns Hang in there!

## 2011-08-21 NOTE — Assessment & Plan Note (Signed)
New.  Pt's sxs and PE consistent w/ infxn.  Start abx.  Reviewed supportive care and red flags that should prompt return.  Pt expressed understanding and is in agreement w/ plan.  

## 2011-08-21 NOTE — Assessment & Plan Note (Signed)
New.  Start Nasonex to improve sxs.

## 2011-08-23 LAB — URINE CULTURE: Colony Count: 70000

## 2011-10-25 ENCOUNTER — Ambulatory Visit (INDEPENDENT_AMBULATORY_CARE_PROVIDER_SITE_OTHER): Payer: BC Managed Care – PPO | Admitting: Family Medicine

## 2011-10-25 ENCOUNTER — Encounter: Payer: Self-pay | Admitting: Family Medicine

## 2011-10-25 VITALS — BP 115/78 | HR 100 | Temp 98.5°F | Ht 67.0 in | Wt 144.8 lb

## 2011-10-25 DIAGNOSIS — N760 Acute vaginitis: Secondary | ICD-10-CM

## 2011-10-25 DIAGNOSIS — J309 Allergic rhinitis, unspecified: Secondary | ICD-10-CM

## 2011-10-25 DIAGNOSIS — J302 Other seasonal allergic rhinitis: Secondary | ICD-10-CM

## 2011-10-25 DIAGNOSIS — K219 Gastro-esophageal reflux disease without esophagitis: Secondary | ICD-10-CM

## 2011-10-25 MED ORDER — RANITIDINE HCL 150 MG PO TABS: ORAL_TABLET | ORAL | Status: DC

## 2011-10-25 NOTE — Progress Notes (Signed)
  Subjective:    Patient ID: Connie Cobb, female    DOB: [birth year], 21 y.o.   MRN: [id number]  HPI Vaginal DC- sxs started right after period ended 10 days ago.  Described as a 'milky discharge', no itching, no odor.  Pt reports it's 'only in my panties'.  Wants this to be checked.  HA- reports this is persisting.  Occuring unilaterally- alternating sides.  Will last 4-5 hrs.  No nausea.  + photophobia, no phonophobia.  Improves w/ lying down.  Improves w/ Excedrin Migraine.  Occuring daily or every other day.  GERD- new problem for pt, will have to stop while she's eating 'b/c i feel like it's going to come back up'.  Has backed off OJ and sxs are less frequent.  Denies rapid eating.  Has not tried OTC meds.   Review of Systems For ROS see HPI     Objective:   Physical Exam  Vitals reviewed. Constitutional: She appears well-developed and well-nourished. No distress.  HENT:  Head: Normocephalic and atraumatic.  Right Ear: Tympanic membrane normal.  Left Ear: Tympanic membrane normal.  Nose: Mucosal edema and rhinorrhea present. Right sinus exhibits no maxillary sinus tenderness and no frontal sinus tenderness. Left sinus exhibits no maxillary sinus tenderness and no frontal sinus tenderness.  Mouth/Throat: Mucous membranes are normal. Posterior oropharyngeal erythema (w/ PND) present.  Eyes: Conjunctivae and EOM are normal. Pupils are equal, round, and reactive to light.  Neck: Normal range of motion. Neck supple.  Cardiovascular: Normal rate, regular rhythm and normal heart sounds.   Pulmonary/Chest: Effort normal and breath sounds normal. No respiratory distress. She has no wheezes. She has no rales.  Abdominal: Soft. Bowel sounds are normal. She exhibits no distension. There is no tenderness. There is no rebound.  Genitourinary: Vaginal discharge found.  Lymphadenopathy:    She has no cervical adenopathy.          Assessment & Plan:

## 2011-10-25 NOTE — Patient Instructions (Addendum)
Your headache is likely allergy/stress combo Continue the Excedrin Migraine as needed or consider Sinus Headache pills to improve your pain Continue the Nasonex Add Zyrtec daily Start the Ranitidine daily for the possible reflux- call if not improving We'll notify you of your lab results Hang in there!

## 2011-10-26 LAB — WET PREP BY MOLECULAR PROBE: Candida species: NEGATIVE

## 2011-10-26 MED ORDER — METRONIDAZOLE 500 MG PO TABS: 500.0000 mg | ORAL_TABLET | Freq: Two times a day (BID) | ORAL | Status: AC

## 2011-10-29 DIAGNOSIS — K219 Gastro-esophageal reflux disease without esophagitis: Secondary | ICD-10-CM | POA: Insufficient documentation

## 2011-10-29 NOTE — Assessment & Plan Note (Signed)
New.  Pt's sxs consistent w/ GERD.  Start H2 blocker.  Pt to call if no relief.

## 2011-10-29 NOTE — Assessment & Plan Note (Signed)
Unchanged.  Continue Nasonex.  Add Zyrtec.  Continue NSAIDs prn for HA.  Sinus decongestant prn.  Reviewed supportive care and red flags that should prompt return.  Pt expressed understanding and is in agreement w/ plan.

## 2011-10-29 NOTE — Assessment & Plan Note (Signed)
Recurrent problem.  Check labs.  Treat prn.

## 2011-12-19 ENCOUNTER — Ambulatory Visit (INDEPENDENT_AMBULATORY_CARE_PROVIDER_SITE_OTHER): Payer: BC Managed Care – PPO | Admitting: Family Medicine

## 2011-12-19 ENCOUNTER — Encounter: Payer: Self-pay | Admitting: Family Medicine

## 2011-12-19 VITALS — BP 112/68 | HR 64 | Temp 98.3°F | Wt 147.8 lb

## 2011-12-19 DIAGNOSIS — N83209 Unspecified ovarian cyst, unspecified side: Secondary | ICD-10-CM | POA: Insufficient documentation

## 2011-12-19 DIAGNOSIS — R1031 Right lower quadrant pain: Secondary | ICD-10-CM

## 2011-12-19 DIAGNOSIS — R102 Pelvic and perineal pain: Secondary | ICD-10-CM

## 2011-12-19 DIAGNOSIS — N949 Unspecified condition associated with female genital organs and menstrual cycle: Secondary | ICD-10-CM

## 2011-12-19 LAB — POCT URINALYSIS DIPSTICK
Bilirubin, UA: NEGATIVE
Ketones, UA: NEGATIVE
Leukocytes, UA: NEGATIVE
Protein, UA: NEGATIVE
Spec Grav, UA: 1.01
pH, UA: 7.5

## 2011-12-19 NOTE — Patient Instructions (Signed)

## 2011-12-19 NOTE — Progress Notes (Signed)
  Subjective:     Connie Cobb is a 21 y.o. female who presents for evaluation of abdominal pain. The pain is described as pressure-like, and is 4/10 in intensity. Pain is located in the RLQ area without radiation. Onset was gradual occurring 2 weeks ago. Symptoms have been unchanged since. Aggravating factors: none. Alleviating factors: none. Associated symptoms: frequency. The patient denies anorexia, arthralagias, belching, chills, constipation, diarrhea, dysuria, fever, flatus, headache, hematochezia, hematuria, melena, myalgias, nausea, sweats and vomiting. Risk factors for pelvic/abdominal pain include none.  Menstrual History: OB History    Grav Para Term Preterm Abortions TAB SAB Ect Mult Living                  No LMP recorded.    The following portions of the patient's history were reviewed and updated as appropriate: allergies, current medications, past family history, past medical history, past social history, past surgical history and problem list.   Review of Systems Pertinent items are noted in HPI.    Objective:    BP 112/68  Pulse 64  Temp 98.3 F (36.8 C) (Oral)  Wt 147 lb 12.8 oz (67.042 kg)  SpO2 98% General:   alert, cooperative, appears stated age and no distress  Lungs:   clear to auscultation bilaterally  Heart:   regular rate and rhythm, S1, S2 normal, no murmur, click, rub or gallop  Abdomen:  abnormal findings:  mild tenderness in the RLQ  CVA:   absent  Pelvis:  Vulva and vagina appear normal. Bimanual exam reveals normal uterus and adnexa.  Extremities:   extremities normal, atraumatic, no cyanosis or edema  Neurologic:   negative  Psychiatric:   normal mood, behavior, speech, dress, and thought processes   Lab Review Labs: Urine pregnancy test, Urinalysis - with micro, GC/Chlamydia DNA probe of cervico/vaginal secretions and Wet mount of vaginal secretions   Imaging CT - Abdominal with contrast, CT - Pelvis    Assessment:    Functional: suspect  ovarian cyst----doubt ap    Plan:    The diagnosis was discussed with the patient and evaluation and treatment plans outlined. See orders for lab and imaging studies. Further follow-up plans will be based on outcome of lab/imaging studies; see orders. Follow up as needed. go to ER if symptoms worsen

## 2011-12-19 NOTE — ED Notes (Signed)
Patient here with abdominal pain.  Patient saw PCP today, had a pelvic with STD testing today.  Patient states that the pain is getting worse.

## 2011-12-20 ENCOUNTER — Telehealth: Payer: Self-pay

## 2011-12-20 ENCOUNTER — Emergency Department (HOSPITAL_COMMUNITY): Payer: BC Managed Care – PPO

## 2011-12-20 ENCOUNTER — Emergency Department (HOSPITAL_COMMUNITY)
Admission: EM | Admit: 2011-12-20 | Discharge: 2011-12-20 | Disposition: A | Discharge status: Home / Self Care | Payer: BC Managed Care – PPO | Attending: Emergency Medicine | Admitting: Emergency Medicine

## 2011-12-20 DIAGNOSIS — N83209 Unspecified ovarian cyst, unspecified side: Secondary | ICD-10-CM

## 2011-12-20 LAB — COMPREHENSIVE METABOLIC PANEL
AST: 16 U/L (ref 0–37)
CO2: 26 mEq/L (ref 19–32)
Calcium: 10 mg/dL (ref 8.4–10.5)
Creatinine, Ser: 0.64 mg/dL (ref 0.50–1.10)
GFR calc Af Amer: >90 mL/min (ref >90)
GFR calc non Af Amer: >90 mL/min (ref >90)

## 2011-12-20 LAB — URINALYSIS, ROUTINE W REFLEX MICROSCOPIC
Glucose, UA: NEGATIVE mg/dL
Nitrite: NEGATIVE
Specific Gravity, Urine: 1.022 (ref 1.005–1.030)
pH: 7 (ref 5.0–8.0)

## 2011-12-20 LAB — CBC
HCT: 37.2 % (ref 36.0–46.0)
MCH: 27.7 pg (ref 26.0–34.0)
MCHC: 33.6 g/dL (ref 30.0–36.0)
MCV: 82.5 fL (ref 78.0–100.0)
RDW: 12.6 % (ref 11.5–15.5)
WBC: 7.2 10*3/uL (ref 4.0–10.5)

## 2011-12-20 LAB — PREGNANCY, URINE: Preg Test, Ur: NEGATIVE

## 2011-12-20 LAB — DIFFERENTIAL
Basophils Absolute: 0 10*3/uL (ref 0.0–0.1)
Eosinophils Relative: 2 % (ref 0–5)
Lymphocytes Relative: 50 % — ABNORMAL HIGH (ref 12–46)
Monocytes Absolute: 0.5 10*3/uL (ref 0.1–1.0)

## 2011-12-20 LAB — URINE MICROSCOPIC-ADD ON

## 2011-12-20 MED ORDER — HYDROMORPHONE HCL PF 1 MG/ML IJ SOLN
1.0000 mg | Freq: Once | INTRAMUSCULAR | Status: AC
  Administered 2011-12-20: 1 mg via INTRAVENOUS
  Filled 2011-12-20: qty 1

## 2011-12-20 MED ORDER — SODIUM CHLORIDE 0.9 % IV BOLUS (SEPSIS)
1000.0000 mL | Freq: Once | INTRAVENOUS | Status: AC
  Administered 2011-12-20: 1000 mL via INTRAVENOUS

## 2011-12-20 MED ORDER — HYDROCODONE-ACETAMINOPHEN 5-325 MG PO TABS: 1.0000 | ORAL_TABLET | ORAL | Status: AC | PRN

## 2011-12-20 MED ORDER — ONDANSETRON HCL 4 MG/2ML IJ SOLN
4.0000 mg | Freq: Once | INTRAMUSCULAR | Status: AC
  Administered 2011-12-20: 4 mg via INTRAVENOUS
  Filled 2011-12-20: qty 2

## 2011-12-20 NOTE — Telephone Encounter (Signed)
Message copied by Arnette Norris on Wed Dec 20, 2011  9:12 AM ------      Message from: Donzetta Kohut B      Created: Wed Dec 20, 2011  9:06 AM      Regarding: ORDER FOR CT       KIM,            WILL YOU PLEASE REMOVE THE ORDER/REFERRAL FOR CT ABD/PELVIS ON THIS PATIENT?  OK PER DR. Laury Axon.

## 2011-12-20 NOTE — Discharge Instructions (Signed)
Ovarian Cyst The ovaries are small organs that are on each side of the uterus. The ovaries are the organs that produce the female hormones, estrogen and progesterone. An ovarian cyst is a sac filled with fluid that can vary in its size. It is normal for a small cyst to form in women who are in the childbearing age and who have menstrual periods. This type of cyst is called a follicle cyst that becomes an ovulation cyst (corpus luteum cyst) after it produces the women's egg. It later goes away on its own if the woman does not become pregnant. There are other kinds of ovarian cysts that may cause problems and may need to be treated. The most serious problem is a cyst with cancer. It should be noted that menopausal women who have an ovarian cyst are at a higher risk of it being a cancer cyst. They should be evaluated very quickly, thoroughly and followed closely. This is especially true in menopausal women because of the high rate of ovarian cancer in women in menopause. CAUSES AND TYPES OF OVARIAN CYSTS:  FUNCTIONAL CYST: The follicle/corpus luteum cyst is a functional cyst that occurs every month during ovulation with the menstrual cycle. They go away with the next menstrual cycle if the woman does not get pregnant. Usually, there are no symptoms with a functional cyst.   ENDOMETRIOMA CYST: This cyst develops from the lining of the uterus tissue. This cyst gets in or on the ovary. It grows every month from the bleeding during the menstrual period. It is also called a "chocolate cyst" because it becomes filled with blood that turns brown. This cyst can cause pain in the lower abdomen during intercourse and with your menstrual period.   CYSTADENOMA CYST: This cyst develops from the cells on the outside of the ovary. They usually are not cancerous. They can get very big and cause lower abdomen pain and pain with intercourse. This type of cyst can twist on itself, cut off its blood supply and cause severe pain.  It also can easily rupture and cause a lot of pain.   DERMOID CYST: This type of cyst is sometimes found in both ovaries. They are found to have different kinds of body tissue in the cyst. The tissue includes skin, teeth, hair, and/or cartilage. They usually do not have symptoms unless they get very big. Dermoid cysts are rarely cancerous.   POLYCYSTIC OVARY: This is a rare condition with hormone problems that produces many small cysts on both ovaries. The cysts are follicle-like cysts that never produce an egg and become a corpus luteum. It can cause an increase in body weight, infertility, acne, increase in body and facial hair and lack of menstrual periods or rare menstrual periods. Many women with this problem develop type 2 diabetes. The exact cause of this problem is unknown. A polycystic ovary is rarely cancerous.   THECA LUTEIN CYST: Occurs when too much hormone (human chorionic gonadotropin) is produced and over-stimulates the ovaries to produce an egg. They are frequently seen when doctors stimulate the ovaries for invitro-fertilization (test tube babies).   LUTEOMA CYST: This cyst is seen during pregnancy. Rarely it can cause an obstruction to the birth canal during labor and delivery. They usually go away after delivery.  SYMPTOMS   Pelvic pain or pressure.   Pain during sexual intercourse.   Increasing girth (swelling) of the abdomen.   Abnormal menstrual periods.   Increasing pain with menstrual periods.   You stop having   menstrual periods and you are not pregnant.  DIAGNOSIS  The diagnosis can be made during:  Routine or annual pelvic examination (common).   Ultrasound.   X-ray of the pelvis.   CT Scan.   MRI.   Blood tests.  TREATMENT   Treatment may only be to follow the cyst monthly for 2 to 3 months with your caregiver. Many go away on their own, especially functional cysts.   May be aspirated (drained) with a long needle with ultrasound, or by laparoscopy  (inserting a tube into the pelvis through a small incision).   The whole cyst can be removed by laparoscopy.   Sometimes the cyst may need to be removed through an incision in the lower abdomen.   Hormone treatment is sometimes used to help dissolve certain cysts.   Birth control pills are sometimes used to help dissolve certain cysts.  HOME CARE INSTRUCTIONS  Follow your caregiver's advice regarding:  Medicine.   Follow up visits to evaluate and treat the cyst.   You may need to come back or make an appointment with another caregiver, to find the exact cause of your cyst, if your caregiver is not a gynecologist.   Get your yearly and recommended pelvic examinations and Pap tests.   Let your caregiver know if you have had an ovarian cyst in the past.  SEEK MEDICAL CARE IF:   Your periods are late, irregular, they stop, or are painful.   Your stomach (abdomen) or pelvic pain does not go away.   Your stomach becomes larger or swollen.   You have pressure on your bladder or trouble emptying your bladder completely.   You have painful sexual intercourse.   You have feelings of fullness, pressure, or discomfort in your stomach.   You lose weight for no apparent reason.   You feel generally ill.   You become constipated.   You lose your appetite.   You develop acne.   You have an increase in body and facial hair.   You are gaining weight, without changing your exercise and eating habits.   You think you are pregnant.  SEEK IMMEDIATE MEDICAL CARE IF:   You have increasing abdominal pain.   You feel sick to your stomach (nausea) and/or vomit.   You develop a fever that comes on suddenly.   You develop abdominal pain during a bowel movement.   Your menstrual periods become heavier than usual.  Document Released: 06/19/2005 Document Revised: 06/08/2011 Document Reviewed: 04/22/2009 [employer] Patient Information 2012 [employer]. 

## 2011-12-20 NOTE — ED Notes (Signed)
Patient is AOx4 and comfortable with her discharge instructions.  Patient has a ride home. 

## 2011-12-20 NOTE — Telephone Encounter (Signed)
Done  KP

## 2011-12-20 NOTE — ED Notes (Signed)
Pt f= given wait time

## 2011-12-20 NOTE — ED Provider Notes (Signed)
Medical screening examination/treatment/procedure(s) were conducted as a shared visit with non-physician practitioner(s) and myself.  I personally evaluated the patient during the encounter  RRR. Min RLQ ttp on exam. Present x 2 weeks. Clinically, unlikely appendicitis given course of symptoms. U/S with ruptured Rt ovarian cyst. Pain controlled in ED.   Forbes Cellar, MD 12/21/11 [phone]

## 2011-12-20 NOTE — ED Provider Notes (Signed)
History     CSN: [id number]  Arrival date & time 12/19/11  2244   First MD Initiated Contact with Connie Cobb 12/20/11 0316      Chief Complaint  Connie Cobb presents with  . Abdominal Pain    (Consider location/radiation/quality/duration/timing/severity/associated sxs/prior treatment) HPI Comments: Connie Cobb here with a two week history of RLQ abdominal pain - states that the pain has progressively gotten worse over the course of the two weeks - states that she saw her PCP, Dr. Hulan Saas yesterday where she had blood work and pelvic exam done - according to the notes, a CT scan was ordered but not done yet.  The Connie Cobb denies fever, chills, nausea, vomiting, constipation - reports diarrhea 2 days ago but none recently.  States no vaginal discharge or bleeding, she is sexually active with one partner and uses birth control and condoms.  She reports no previous history of STD, ovarian cysts.  Connie Cobb is a 21 y.o. female presenting with abdominal pain. The history is provided by the Connie Cobb and a parent. No language interpreter was used.  Abdominal Pain The primary symptoms of the illness include abdominal pain and diarrhea. The primary symptoms of the illness do not include fever, fatigue, shortness of breath, nausea, vomiting, hematemesis, hematochezia, dysuria, vaginal discharge or vaginal bleeding. The current episode started more than 2 days ago. The onset of the illness was gradual. The problem has been gradually worsening.  The Connie Cobb states that she believes she is currently not pregnant. The Connie Cobb has not had a change in bowel habit. Symptoms associated with the illness do not include chills, anorexia, diaphoresis, heartburn, constipation, urgency, hematuria, frequency or back pain.    Past Medical History  Diagnosis Date  . Anemia     No past surgical history on file.  Family History  Problem Relation Age of Onset  . Diabetes      grandparent  . Breast cancer      grandmother     History  Substance Use Topics  . Smoking status: Never Smoker   . Smokeless tobacco: Not on file  . Alcohol Use: No    OB History    Grav Para Term Preterm Abortions TAB SAB Ect Mult Living                  Review of Systems  Constitutional: Negative for fever, chills, diaphoresis and fatigue.  Respiratory: Negative for shortness of breath.   Gastrointestinal: Positive for abdominal pain and diarrhea. Negative for heartburn, nausea, vomiting, constipation, hematochezia, anorexia and hematemesis.  Genitourinary: Positive for pelvic pain. Negative for dysuria, urgency, frequency, hematuria, vaginal bleeding and vaginal discharge.  Musculoskeletal: Negative for back pain.  All other systems reviewed and are negative.    Allergies  Sulfonamide derivatives  Home Medications   Current Outpatient Rx  Name Route Sig Dispense Refill  . CITALOPRAM HYDROBROMIDE 20 MG PO TABS Oral Take 1 tablet (20 mg total) by mouth daily. 30 tablet 3  . MOMETASONE FUROATE 50 MCG/ACT NA SUSP Nasal Place 2 sprays into the nose daily as needed.    Suzzanne Cloud ESTRADIOL 0.25-35 MG-MCG PO TABS Oral Take 1 tablet by mouth daily. 1 Package 11  . RANITIDINE HCL 150 MG PO TABS  1 tab daily 30 tablet 3    BP 122/74  Pulse 88  Temp 98.5 F (36.9 C) (Oral)  Resp 16  SpO2 100%  LMP 12/09/2011  Physical Exam  Nursing note and vitals reviewed. Constitutional: She is oriented to person,  place, and time. She appears well-developed and well-nourished. No distress.  HENT:  Head: Normocephalic and atraumatic.  Right Ear: External ear normal.  Left Ear: External ear normal.  Nose: Nose normal.  Mouth/Throat: Oropharynx is clear and moist. No oropharyngeal exudate.  Eyes: Conjunctivae are normal. Pupils are equal, round, and reactive to light. No scleral icterus.  Neck: Normal range of motion. Neck supple.  Cardiovascular: Normal rate, regular rhythm and normal heart sounds.  Exam reveals no  gallop and no friction rub.   No murmur heard. Pulmonary/Chest: Effort normal and breath sounds normal. No respiratory distress. She has no wheezes. She has no rales. She exhibits no tenderness.  Abdominal: Soft. Bowel sounds are normal. She exhibits no distension and no mass. There is tenderness. There is no rebound and no guarding.    Genitourinary:       Pelvic deferred because it was done yesterday  Musculoskeletal: Normal range of motion. She exhibits no edema and no tenderness.  Lymphadenopathy:    She has no cervical adenopathy.  Neurological: She is alert and oriented to person, place, and time. No cranial nerve deficit.  Skin: Skin is warm and dry. No rash noted. No erythema. No pallor.  Psychiatric: She has a normal mood and affect. Her behavior is normal. Judgment and thought content normal.    ED Course  Procedures (including critical care time)  Labs Reviewed  DIFFERENTIAL - Abnormal; Notable for the following:    Neutrophils Relative 41 (*)     Lymphocytes Relative 50 (*)     All other components within normal limits  CBC  COMPREHENSIVE METABOLIC PANEL  LIPASE, BLOOD  URINALYSIS, ROUTINE W REFLEX MICROSCOPIC  PREGNANCY, URINE   No results found. Results for orders placed during the hospital encounter of 12/20/11  CBC      Component Value Range   WBC 7.2  4.0 - 10.5 K/uL   RBC 4.51  3.87 - 5.11 MIL/uL   Hemoglobin 12.5  12.0 - 15.0 g/dL   HCT 11.9  14.7 - 82.9 %   MCV 82.5  78.0 - 100.0 fL   MCH 27.7  26.0 - 34.0 pg   MCHC 33.6  30.0 - 36.0 g/dL   RDW 56.2  13.0 - 86.5 %   Platelets 265  150 - 400 K/uL  DIFFERENTIAL      Component Value Range   Neutrophils Relative 41 (*) 43 - 77 %   Neutro Abs 2.9  1.7 - 7.7 K/uL   Lymphocytes Relative 50 (*) 12 - 46 %   Lymphs Abs 3.6  0.7 - 4.0 K/uL   Monocytes Relative 7  3 - 12 %   Monocytes Absolute 0.5  0.1 - 1.0 K/uL   Eosinophils Relative 2  0 - 5 %   Eosinophils Absolute 0.1  0.0 - 0.7 K/uL   Basophils  Relative 1  0 - 1 %   Basophils Absolute 0.0  0.0 - 0.1 K/uL  COMPREHENSIVE METABOLIC PANEL      Component Value Range   Sodium 139  135 - 145 mEq/L   Potassium 4.1  3.5 - 5.1 mEq/L   Chloride 104  96 - 112 mEq/L   CO2 26  19 - 32 mEq/L   Glucose, Bld 98  70 - 99 mg/dL   BUN 10  6 - 23 mg/dL   Creatinine, Ser 7.84  0.50 - 1.10 mg/dL   Calcium 69.6  8.4 - 29.5 mg/dL   Total Protein 7.3  6.0 - 8.3 g/dL   Albumin 4.3  3.5 - 5.2 g/dL   AST 16  0 - 37 U/L   ALT 10  0 - 35 U/L   Alkaline Phosphatase 41  39 - 117 U/L   Total Bilirubin 0.2 (*) 0.3 - 1.2 mg/dL   GFR calc non Af Amer >90  >90 mL/min   GFR calc Af Amer >90  >90 mL/min  LIPASE, BLOOD      Component Value Range   Lipase 20  11 - 59 U/L  URINALYSIS, ROUTINE W REFLEX MICROSCOPIC      Component Value Range   Color, Urine YELLOW  YELLOW   APPearance CLEAR  CLEAR   Specific Gravity, Urine 1.022  1.005 - 1.030   pH 7.0  5.0 - 8.0   Glucose, UA NEGATIVE  NEGATIVE mg/dL   Hgb urine dipstick NEGATIVE  NEGATIVE   Bilirubin Urine NEGATIVE  NEGATIVE   Ketones, ur NEGATIVE  NEGATIVE mg/dL   Protein, ur NEGATIVE  NEGATIVE mg/dL   Urobilinogen, UA 0.2  0.0 - 1.0 mg/dL   Nitrite NEGATIVE  NEGATIVE   Leukocytes, UA SMALL (*) NEGATIVE  PREGNANCY, URINE      Component Value Range   Preg Test, Ur NEGATIVE  NEGATIVE  URINE MICROSCOPIC-ADD ON      Component Value Range   Squamous Epithelial / LPF FEW (*) RARE   WBC, UA 3-6  <3 WBC/hpf   Bacteria, UA RARE  RARE   US Transvaginal Non-ob  12/20/2011  *RADIOLOGY REPORT*  Clinical Data: Right lower quadrant abdominal pain for 2 weeks.  TRANSABDOMINAL AND TRANSVAGINAL ULTRASOUND OF PELVIS Technique:  Both transabdominal and transvaginal ultrasound examinations of the pelvis were performed. Transabdominal technique was performed for global imaging of the pelvis including uterus, ovaries, adnexal regions, and pelvic cul-de-sac.  It was necessary to proceed with endovaginal exam following the  transabdominal exam to visualize the uterus and ovaries in greater detail.  Comparison:  None  Findings:  Uterus: Normal in size and appearance; measures 8.5 x 4.0 x 4.1 cm.  Endometrium: Normal in thickness and appearance; measures 0.6 cm in thickness.  Right ovary:  There is an apparent collapsing cyst within the right ovary, measuring approximately 2.4 x 1.8 x 1.7 cm; the right ovary measures 5.5 x 2.4 x 3.4 cm.  Left ovary: Normal appearance/no adnexal mass; measures 2.4 x 1.7 x 2.2 cm.  Other findings: A small amount of free fluid at the right adnexa may reflect a recently ruptured cyst.  IMPRESSION: Apparent collapsing cyst within the right ovary, measuring 2.4 x 1.8 x 1.7 cm.  Associated small amount of free fluid at the right adnexa.  This raises question for a recently ruptured right ovarian cyst, which may explain the Connie Cobb's symptoms.  Original Report Authenticated By: Tonia Ghent, M.D.   US Pelvis Complete  12/20/2011  *RADIOLOGY REPORT*  Clinical Data: Right lower quadrant abdominal pain for 2 weeks.  TRANSABDOMINAL AND TRANSVAGINAL ULTRASOUND OF PELVIS Technique:  Both transabdominal and transvaginal ultrasound examinations of the pelvis were performed. Transabdominal technique was performed for global imaging of the pelvis including uterus, ovaries, adnexal regions, and pelvic cul-de-sac.  It was necessary to proceed with endovaginal exam following the transabdominal exam to visualize the uterus and ovaries in greater detail.  Comparison:  None  Findings:  Uterus: Normal in size and appearance; measures 8.5 x 4.0 x 4.1 cm.  Endometrium: Normal in thickness and appearance; measures 0.6 cm in thickness.  Right  ovary:  There is an apparent collapsing cyst within the right ovary, measuring approximately 2.4 x 1.8 x 1.7 cm; the right ovary measures 5.5 x 2.4 x 3.4 cm.  Left ovary: Normal appearance/no adnexal mass; measures 2.4 x 1.7 x 2.2 cm.  Other findings: A small amount of free fluid at the  right adnexa may reflect a recently ruptured cyst.  IMPRESSION: Apparent collapsing cyst within the right ovary, measuring 2.4 x 1.8 x 1.7 cm.  Associated small amount of free fluid at the right adnexa.  This raises question for a recently ruptured right ovarian cyst, which may explain the Connie Cobb's symptoms.  Original Report Authenticated By: Tonia Ghent, M.D.      Ruptured right ovarian cyst    MDM  Connie Cobb here with a several week history of worsening right lower abdominal pain - saw PCP today and was told to come here for worsening pain - doubt appendicitis as labs and clinical exam is inconsistent with this - [place] with likely ruptured right ovarian cyst, c/w Connie Cobb's symptoms - will discharge home with pain control and follow up with gynecology        Connie Cobb C. [place], [place] 12/20/11 [phone]

## 2011-12-22 ENCOUNTER — Telehealth: Payer: Self-pay | Admitting: *Deleted

## 2011-12-22 ENCOUNTER — Ambulatory Visit (INDEPENDENT_AMBULATORY_CARE_PROVIDER_SITE_OTHER): Payer: BC Managed Care – PPO | Admitting: Family Medicine

## 2011-12-22 ENCOUNTER — Encounter: Payer: Self-pay | Admitting: Family Medicine

## 2011-12-22 VITALS — BP 112/69 | HR 92 | Temp 98.5°F | Ht 67.25 in | Wt 146.6 lb

## 2011-12-22 DIAGNOSIS — A749 Chlamydial infection, unspecified: Secondary | ICD-10-CM | POA: Insufficient documentation

## 2011-12-22 MED ORDER — AZITHROMYCIN 500 MG PO TABS: 1000.0000 mg | ORAL_TABLET | Freq: Every day | ORAL | Status: AC

## 2011-12-22 MED ORDER — METRONIDAZOLE 500 MG PO TABS: 500.0000 mg | ORAL_TABLET | Freq: Two times a day (BID) | ORAL | Status: AC

## 2011-12-22 NOTE — Telephone Encounter (Signed)
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

## 2011-12-22 NOTE — Patient Instructions (Addendum)
Take the Flagyl twice daily x7 days for the BV Take the 2 tabs of Azithromycin at the same time- just once- for the chlamydia Make sure to protect yourself! Tell Dr Neva Seat about the frequent BV infections- she will be able to help Call with any questions or concerns Have a great weekend!!!

## 2011-12-22 NOTE — Telephone Encounter (Signed)
Pt called back to advise that she went to ER Tuesday night was advised cyst on her ovary per US performed, was discharged, given hydrocodone and a referral to GYN, MD Tabori aware, set pt up for an apt today 12-22-11 at 2:30pm, pt accepted MD Tabori made aware

## 2011-12-22 NOTE — Progress Notes (Signed)
  Subjective:    Patient ID: Connie Cobb, female    DOB: [birth year], 21 y.o.   MRN: [id number]  HPI Chlamydia- recent lab results positive.  Pt brought in to discuss this.  Aware of recent partner and contact info  Ruptured Ovarian Cyst- saw Dr Laury Axon and then ER on Tuesday.  Has been seeing Dr Neva Seat and plans to f/u w/ her.  Pain is improving.   Review of Systems For ROS see HPI     Objective:   Physical Exam  Vitals reviewed. Constitutional: She appears well-developed and well-nourished. No distress.  Psychiatric: She has a normal mood and affect. Her behavior is normal. Judgment and thought content normal.          Assessment & Plan:

## 2012-01-02 ENCOUNTER — Telehealth: Payer: Self-pay | Admitting: *Deleted

## 2012-01-02 NOTE — Telephone Encounter (Signed)
Faxed Confidential Communicable Disease report to [facility] attention Chandra Batch [phone] with all information/results and treatment noted per DX of Chlamydia and treatment of Azithromycin 500mg  2 tablets x 1 dose, MD Tabori made aware verbally

## 2012-01-09 NOTE — Assessment & Plan Note (Signed)
Discussed dx, need for tx, need to notify partner, and the fact that i'm obligated to report to health dept.  Pt aware of her mistake and states it will not be an issue in the future.

## 2012-03-19 ENCOUNTER — Ambulatory Visit (INDEPENDENT_AMBULATORY_CARE_PROVIDER_SITE_OTHER): Payer: BC Managed Care – PPO | Admitting: Family Medicine

## 2012-03-19 ENCOUNTER — Encounter: Payer: Self-pay | Admitting: Family Medicine

## 2012-03-19 VITALS — BP 100/60 | HR 71 | Temp 98.4°F | Wt 148.6 lb

## 2012-03-19 DIAGNOSIS — A749 Chlamydial infection, unspecified: Secondary | ICD-10-CM

## 2012-03-19 NOTE — Progress Notes (Signed)
  Subjective:    Patient ID: Connie Cobb, female    DOB: 1991/05/08, 21 y.o.   MRN: [id number]  HPI Pt here just to have GC and chlamydia rechecked because she has a new partner.  She has no signs or symptoms and will have other tests done when she comes in for her cpe next month.      Review of Systems As above    Objective:   Physical Exam  Constitutional: She is oriented to person, place, and time. She appears well-developed and well-nourished.  Neurological: She is alert and oriented to person, place, and time.  Psychiatric: She has a normal mood and affect. Her behavior is normal. Judgment and thought content normal.          Assessment & Plan:

## 2012-03-19 NOTE — Patient Instructions (Addendum)
Chlamydia Test  This a test to see if you have Chlamydia which is a common sexually transmitted disease (STD). You get this disease by having sexual contact (oral, vaginal, or anal) with an infected person. An infected mother can spread the disease to her baby during childbirth. If this test is positive, you have a sexually transmitted disease (STD). DO NOT have unprotected sex until the treatment is complete and you have been retested and are negative.  PREPARATION FOR TEST  Your caregiver will use a swab to take a sample. The swab may be from the cervix, urethra, penis, anus, or throat. It may be possible to use a urine sample, if the lab where the sample is sent is able to test urine for this disease.  NORMAL FINDINGS   No Growth   Antibodies: less than 1:640  Ranges for normal findings may vary among different laboratories and hospitals. You should always check with your doctor after having lab work or other tests done to discuss the meaning of your test results and whether your values are considered within normal limits.  MEANING OF TEST   Your caregiver will go over the test results with you and discuss the importance and meaning of your results, as well as treatment options and the need for additional tests if necessary.  OBTAINING THE TEST RESULTS  It is your responsibility to obtain your test results. Ask the lab or department performing the test when and how you will get your results.  Document Released: 07/12/2004 Document Revised: 06/08/2011 Document Reviewed: 05/28/2008  Exit[employer] Patient Information 2012 [employer].

## 2012-03-19 NOTE — Assessment & Plan Note (Signed)
Will recheck in urine today

## 2012-03-20 NOTE — Addendum Note (Signed)
Addended by: Silvio Pate D on: 03/20/2012 02:57 PM   Modules accepted: Orders

## 2012-07-21 ENCOUNTER — Emergency Department (HOSPITAL_COMMUNITY): Payer: Self-pay

## 2012-07-21 ENCOUNTER — Emergency Department (HOSPITAL_COMMUNITY)
Admission: EM | Admit: 2012-07-21 | Discharge: 2012-07-21 | Disposition: A | Discharge status: Home / Self Care | Payer: Self-pay | Attending: Emergency Medicine | Admitting: Emergency Medicine

## 2012-07-21 ENCOUNTER — Encounter (HOSPITAL_COMMUNITY): Payer: Self-pay | Admitting: Emergency Medicine

## 2012-07-21 DIAGNOSIS — Z3202 Encounter for pregnancy test, result negative: Secondary | ICD-10-CM | POA: Insufficient documentation

## 2012-07-21 DIAGNOSIS — Z862 Personal history of diseases of the blood and blood-forming organs and certain disorders involving the immune mechanism: Secondary | ICD-10-CM | POA: Insufficient documentation

## 2012-07-21 DIAGNOSIS — Z79899 Other long term (current) drug therapy: Secondary | ICD-10-CM | POA: Insufficient documentation

## 2012-07-21 DIAGNOSIS — G43909 Migraine, unspecified, not intractable, without status migrainosus: Secondary | ICD-10-CM | POA: Insufficient documentation

## 2012-07-21 DIAGNOSIS — H5710 Ocular pain, unspecified eye: Secondary | ICD-10-CM | POA: Insufficient documentation

## 2012-07-21 LAB — POCT PREGNANCY, URINE: Preg Test, Ur: NEGATIVE

## 2012-07-21 MED ORDER — METOCLOPRAMIDE HCL 5 MG/ML IJ SOLN
10.0000 mg | Freq: Once | INTRAMUSCULAR | Status: AC
  Administered 2012-07-21: 10 mg via INTRAMUSCULAR
  Filled 2012-07-21: qty 2

## 2012-07-21 MED ORDER — KETOROLAC TROMETHAMINE 60 MG/2ML IM SOLN
60.0000 mg | Freq: Once | INTRAMUSCULAR | Status: AC
  Administered 2012-07-21: 60 mg via INTRAMUSCULAR
  Filled 2012-07-21: qty 2

## 2012-07-21 MED ORDER — PROMETHAZINE HCL 25 MG PO TABS: 25.0000 mg | ORAL_TABLET | Freq: Four times a day (QID) | ORAL | Status: DC | PRN

## 2012-07-21 MED ORDER — TRAMADOL HCL 50 MG PO TABS: 50.0000 mg | ORAL_TABLET | Freq: Four times a day (QID) | ORAL | Status: DC | PRN

## 2012-07-21 MED ORDER — DIPHENHYDRAMINE HCL 50 MG/ML IJ SOLN
25.0000 mg | Freq: Once | INTRAMUSCULAR | Status: AC
  Administered 2012-07-21: 25 mg via INTRAMUSCULAR
  Filled 2012-07-21: qty 1

## 2012-07-21 NOTE — ED Provider Notes (Signed)
History     CSN: [id number]  Arrival date & time 07/21/12  1131   First MD Initiated Contact with Patient 07/21/12 1501      Chief Complaint  Patient presents with  . Headache    (Consider location/radiation/quality/duration/timing/severity/associated sxs/prior treatment) HPI Comments: Patient comes to the ER for evaluation of headache. She has been having daily headaches for 2 weeks. Pain starts in the right eye and spreads to the right side of the head. Pain is throbbing in nature. It certainly worsens when it is present. Patient reports that she gets nauseated but has not had any vomiting. Penlight worsens the pain. She does not have a history of migraines her mother does have similar migraine headaches.  Patient is a 22 y.o. female presenting with headaches.  Headache  Pertinent negatives include no fever.    Past Medical History  Diagnosis Date  . Anemia     Past Surgical History  Procedure Date  . Tonsillectomy     Family History  Problem Relation Age of Onset  . Diabetes      grandparent  . Breast cancer      grandmother    History  Substance Use Topics  . Smoking status: Never Smoker   . Smokeless tobacco: Not on file  . Alcohol Use: No    OB History    Grav Para Term Preterm Abortions TAB SAB Ect Mult Living                  Review of Systems  Constitutional: Negative for fever.  HENT: Negative for neck pain.   Eyes: Positive for pain.  Neurological: Positive for headaches.  All other systems reviewed and are negative.    Allergies  Sulfonamide derivatives  Home Medications   Current Outpatient Rx  Name  Route  Sig  Dispense  Refill  . HYDROCODONE-ACETAMINOPHEN 5-325 MG PO TABS   Oral   Take 1 tablet by mouth once.         . IBUPROFEN 200 MG PO TABS   Oral   Take 800 mg by mouth every 8 (eight) hours as needed. For pain.         Marland Kitchen MEDROXYPROGESTERONE ACETATE 150 MG/ML IM SUSP   Intramuscular   Inject 150 mg into the muscle  every 3 (three) months.           BP 136/81  Pulse 102  Temp 98.8 F (37.1 C) (Oral)  Resp 16  SpO2 98%  Physical Exam  Constitutional: She is oriented to person, place, and time. She appears well-developed and well-nourished. No distress.  HENT:  Head: Normocephalic and atraumatic.  Right Ear: Hearing normal.  Nose: Nose normal.  Mouth/Throat: Oropharynx is clear and moist and mucous membranes are normal.  Eyes: Conjunctivae normal and EOM are normal. Pupils are equal, round, and reactive to light.  Neck: Normal range of motion. Neck supple.  Cardiovascular: Normal rate, regular rhythm, S1 normal and S2 normal.  Exam reveals no gallop and no friction rub.   No murmur heard. Pulmonary/Chest: Effort normal and breath sounds normal. No respiratory distress. She exhibits no tenderness.  Abdominal: Soft. Normal appearance and bowel sounds are normal. There is no hepatosplenomegaly. There is no tenderness. There is no rebound, no guarding, no tenderness at McBurney's point and negative Murphy's sign. No hernia.  Musculoskeletal: Normal range of motion.  Neurological: She is alert and oriented to person, place, and time. She has normal strength. No cranial nerve deficit or  sensory deficit. Coordination normal. GCS eye subscore is 4. GCS verbal subscore is 5. GCS motor subscore is 6.  Skin: Skin is warm, dry and intact. No rash noted. No cyanosis.  Psychiatric: She has a normal mood and affect. Her speech is normal and behavior is normal. Thought content normal.    ED Course  Procedures (including critical care time)  Labs Reviewed - No data to display Ct Head Wo Contrast  07/21/2012  *RADIOLOGY REPORT*  Clinical Data:  Headaches for 2 weeks.  Light sensitivity.  CT HEAD WITHOUT CONTRAST  Technique:  Contiguous axial images were obtained from the base of the skull through the vertex without contrast  Comparison:  None.  Findings:  The brain has a normal appearance without evidence for  hemorrhage, acute infarction, hydrocephalus, or mass lesion.  There is no extra axial fluid collection.  The skull and paranasal sinuses are normal.  IMPRESSION: Normal CT of the head without contrast.   Original Report Authenticated By: Davonna Belling, M.D.      Diagnosis: Headache, migraine type    MDM  Patient presents to the ER for evaluation of headaches. She has been having headaches for approximately 2 weeks. Headache, ago. Description of the headache is very consistent with a migraine. She has slow onset of pain in her right eye and then progression to generalized right-sided headache with photophobia and nausea. She has a normal neurologic examination. There is a family history of migraine headaches. Patient's CT was normal. Based on her presentation of slow onset of headache and recurrence over a period of 2 weeks, I do not feel that she has any likelihood of subarachnoid hemorrhage and does not need any further study. Patient treated for migraine here in the ER, followup as needed.        Gilda Crease, MD 07/21/12 1726

## 2012-07-21 NOTE — ED Notes (Signed)
Presented with HA 7/10 pain. 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 Patient smiling and giggling.

## 2012-07-21 NOTE — ED Notes (Signed)
Pt presenting to ed with c/o headaches x 2 weeks. Pt denies nausea and vomiting pt states sensitivity to light. Pt denies sensitivity to noise.

## 2012-08-17 ENCOUNTER — Other Ambulatory Visit: Payer: Self-pay

## 2013-01-23 ENCOUNTER — Telehealth: Payer: Self-pay | Admitting: Family Medicine

## 2013-01-23 ENCOUNTER — Encounter: Payer: Self-pay | Admitting: Family Medicine

## 2013-01-23 ENCOUNTER — Ambulatory Visit (INDEPENDENT_AMBULATORY_CARE_PROVIDER_SITE_OTHER): Payer: BC Managed Care – PPO | Admitting: Family Medicine

## 2013-01-23 VITALS — BP 122/80 | HR 76 | Temp 98.5°F | Ht 67.75 in | Wt 155.2 lb

## 2013-01-23 DIAGNOSIS — R41840 Attention and concentration deficit: Secondary | ICD-10-CM

## 2013-01-23 DIAGNOSIS — G43909 Migraine, unspecified, not intractable, without status migrainosus: Secondary | ICD-10-CM | POA: Insufficient documentation

## 2013-01-23 MED ORDER — PROPRANOLOL HCL 40 MG PO TABS: 40.0000 mg | ORAL_TABLET | Freq: Two times a day (BID) | ORAL | Status: DC

## 2013-01-23 NOTE — Assessment & Plan Note (Signed)
New to provider, hx of similar for pt.  Has seen neuro previously and was tx'd in ER w/ Ultram.  Due to increased frequency of HAs will start propranolol as a prophylactic and continue Tramadol prn.  If no improvement in sxs, will refer back to neuro.  Will follow.

## 2013-01-23 NOTE — Telephone Encounter (Signed)
Patient was seen today and states that as Dr. Beverely Low instructed her to do, she called [facility] for an appointment to get the testing done but they are unable to get her in until "September or October." She states that she was told to call back if it would be too long for her to get in so that something could be prescribed.

## 2013-01-23 NOTE — Patient Instructions (Addendum)
Call and schedule ADD testing w/ [facility] Once you have your testing done, call and schedule an appt w/ me in 1-2 weeks to review your results If they are unable to get your testing done quickly, call me Start the propranolol twice daily for migraine prophylaxis Drink plenty of fluids Call with any questions or concerns Hang in there!!

## 2013-01-23 NOTE — Assessment & Plan Note (Signed)
New.  Pt concerned about possible ADD.  Discussed need for formal evaluation prior to treatment.  #s provided for [facility].  Pt to call and schedule.  If unable to get appt in timely manner, discussed starting Strattera and monitoring for improvement but pt aware that she will not get any controlled substances w/out formal dx.  Pt expressed understanding and is in agreement w/ plan.

## 2013-01-23 NOTE — Progress Notes (Signed)
  Subjective:    Patient ID: Connie Cobb, female    DOB: [birth year], 22 y.o.   MRN: [id number]  HPI ADHD- pt started nursing school 2 yrs ago and felt it was anxiety that was keeping her from 'doing what i need to do'.  A teacher brought it to her attention that she will zone out, start tasks but not complete them, lose focus on task at hand.  Teacher is concerned about possible ADD.  When pt discussed this w/ mom, mom reports that she had to 'stay on pt' to get things accomplished but it was not a big problem growing up.  Always did well in school.  Migraine- pt went to hospital and was started on Tramadol for migraines.  Has been to neuro in the past.  Is wondering if there is something to take for migraines that's not habit forming.  Can't afford to take something that impairs cognition   Review of Systems For ROS see HPI     Objective:   Physical Exam  Vitals reviewed. Constitutional: She is oriented to person, place, and time. She appears well-developed and well-nourished. No distress.  HENT:  Head: Normocephalic and atraumatic.  Neurological: She is alert and oriented to person, place, and time. She has normal reflexes. No cranial nerve deficit. Coordination normal.  Skin: Skin is warm and dry.  Psychiatric: She has a normal mood and affect. Her behavior is normal. Thought content normal.          Assessment & Plan:

## 2013-01-24 MED ORDER — ATOMOXETINE HCL 40 MG PO CAPS: 40.0000 mg | ORAL_CAPSULE | Freq: Every day | ORAL | Status: DC

## 2013-01-24 NOTE — Telephone Encounter (Signed)
Discussed with patient and she has been advised to continue to keep her apt with [facility] but she agreed to follow up in 1 mo as well. Rx sent to the pharmacy.      KP

## 2013-01-24 NOTE — Telephone Encounter (Signed)
Ok for [employer] 40mg  daily x1 month, #30, 1 refill.  Needs f/u OV in 1 month to check if medication is helping prior to increasing to 80 mg.

## 2013-01-27 ENCOUNTER — Telehealth: Payer: Self-pay | Admitting: Family Medicine

## 2013-01-27 NOTE — Telephone Encounter (Signed)
Patient called stating the Strattera we prescribed is too expensive ($275) bc her insurance will not cover it. Patient is requesting a different medication that is more affordable. Pt uses [facility] on [address]

## 2013-01-27 NOTE — Telephone Encounter (Signed)
Spoke with patient and advised her to contact her insurance company to see which medications they would cover and we would change scripts accordingly.

## 2013-01-27 NOTE — Telephone Encounter (Signed)
Patient called back and states that her insurance will cover Adderall Rx.

## 2013-01-28 ENCOUNTER — Encounter: Payer: Self-pay | Admitting: Family Medicine

## 2013-01-28 NOTE — Telephone Encounter (Signed)
Please ask Luster Landsberg what our other options for ADHD testing are so pt can get sooner appt.  Cannot prescribe stimulant w/out pt having a dx.  Please call pt and give her names and #s.

## 2013-01-29 NOTE — Telephone Encounter (Signed)
Patient calling today asking that her call be returned as soon as possible.

## 2013-01-29 NOTE — Telephone Encounter (Signed)
Patient states she can't afford Strattera due to insurance not covering this. They will however cover Adderall. Okay to switch and if so, to what dose?

## 2013-01-29 NOTE — Telephone Encounter (Signed)
Pt has not had official ADD testing done and I cannot/will not prescribe a stimulant w/out an actual dx.  We were going to attempt the strattera while pt was waiting for her testing to be completed.  We can provide names and #s of psych/counseling providers that do ADD testing and she can call around and get 1st available appt but she won't get Adderall w/out testing

## 2013-01-29 NOTE — Telephone Encounter (Signed)
Spoke with pt advised that without testing for ADD, Dr. Beverely Low is unable to prescribe anything else.

## 2013-02-22 ENCOUNTER — Emergency Department (HOSPITAL_COMMUNITY)
Admission: EM | Admit: 2013-02-22 | Discharge: 2013-02-23 | Disposition: A | Discharge status: Home / Self Care | Payer: BC Managed Care – PPO | Attending: Emergency Medicine | Admitting: Emergency Medicine

## 2013-02-22 DIAGNOSIS — H53149 Visual discomfort, unspecified: Secondary | ICD-10-CM | POA: Insufficient documentation

## 2013-02-22 DIAGNOSIS — Z79899 Other long term (current) drug therapy: Secondary | ICD-10-CM | POA: Insufficient documentation

## 2013-02-22 DIAGNOSIS — Z862 Personal history of diseases of the blood and blood-forming organs and certain disorders involving the immune mechanism: Secondary | ICD-10-CM | POA: Insufficient documentation

## 2013-02-22 DIAGNOSIS — G43909 Migraine, unspecified, not intractable, without status migrainosus: Secondary | ICD-10-CM | POA: Insufficient documentation

## 2013-02-22 MED ORDER — SODIUM CHLORIDE 0.9 % IV BOLUS (SEPSIS)
1000.0000 mL | Freq: Once | INTRAVENOUS | Status: AC
  Administered 2013-02-23: 1000 mL via INTRAVENOUS

## 2013-02-22 MED ORDER — PROMETHAZINE HCL 25 MG/ML IJ SOLN
25.0000 mg | Freq: Once | INTRAMUSCULAR | Status: AC
  Administered 2013-02-23: 25 mg via INTRAVENOUS
  Filled 2013-02-22: qty 1

## 2013-02-22 MED ORDER — KETOROLAC TROMETHAMINE 30 MG/ML IJ SOLN
30.0000 mg | Freq: Once | INTRAMUSCULAR | Status: AC
  Administered 2013-02-23: 30 mg via INTRAVENOUS
  Filled 2013-02-22: qty 1

## 2013-02-22 MED ORDER — DEXAMETHASONE SODIUM PHOSPHATE 10 MG/ML IJ SOLN
10.0000 mg | Freq: Once | INTRAMUSCULAR | Status: AC
  Administered 2013-02-23: 10 mg via INTRAVENOUS
  Filled 2013-02-22: qty 1

## 2013-02-22 MED ORDER — DIPHENHYDRAMINE HCL 50 MG/ML IJ SOLN
25.0000 mg | Freq: Once | INTRAMUSCULAR | Status: AC
  Administered 2013-02-23: 25 mg via INTRAVENOUS
  Filled 2013-02-22: qty 1

## 2013-02-22 NOTE — ED Provider Notes (Signed)
CSN: [id number]     Arrival date & time 02/22/13  2206 History     First MD Initiated Contact with Patient 02/22/13 2249     Chief Complaint  Patient presents with  . Migraine   (Consider location/radiation/quality/duration/timing/severity/associated sxs/prior Treatment) HPI  Connie Cobb is a 22 y.o.female with a significant PMH of migraines presents to the ER with complaints of a migraine headache. She has a hx of migraine headaches ever since high school that are frequent. She received her first Depo shot 2 months ago and has had a constant headache ever since then that is like her normal headache. She went to her PCP who started her on a BP medication since her blood pressure was slightly elevated. When that did not help her headache she sought out another provider who discontinued the other medication and wrote her for Topamax. The Topamax helped for a brief time but she still continues to get headaches. She plans to dc the Depo shot. She comes to the ED today because she decided she needs some relief from his headache. It is not acutely worse or different today then any other day. She has frontal lobe pain that she describes as throbbing with photophobia.   Past Medical History  Diagnosis Date  . Anemia    Past Surgical History  Procedure Laterality Date  . Tonsillectomy     Family History  Problem Relation Age of Onset  . Diabetes      grandparent  . Breast cancer      grandmother   History  Substance Use Topics  . Smoking status: Never Smoker   . Smokeless tobacco: Not on file  . Alcohol Use: No   OB History   Grav Para Term Preterm Abortions TAB SAB Ect Mult Living                 Review of Systems ROS is negative unless otherwise stated in the HPI   Allergies  Sulfonamide derivatives  Home Medications   Current Outpatient Rx  Name  Route  Sig  Dispense  Refill  . amphetamine-dextroamphetamine (ADDERALL) 10 MG tablet   Oral   Take 10 mg by mouth 2 (two)  times daily.         . medroxyPROGESTERone (DEPO-PROVERA) 150 MG/ML injection   Intramuscular   Inject 150 mg into the muscle every 3 (three) months.         . topiramate (TOPAMAX) 25 MG tablet   Oral   Take 25 mg by mouth at bedtime.          BP 106/60  Pulse 79  Temp(Src) 98.2 F (36.8 C) (Oral)  Resp 12  Wt 149 lb (67.586 kg)  BMI 22.82 kg/m2  SpO2 100% Physical Exam  Nursing note and vitals reviewed. Constitutional: She is oriented to person, place, and time. She appears well-developed and well-nourished. No distress.  HENT:  Head: Normocephalic and atraumatic.  Eyes: Pupils are equal, round, and reactive to light.  Neck: Normal range of motion. Neck supple.  Cardiovascular: Normal rate and regular rhythm.   Pulmonary/Chest: Effort normal.  Abdominal: Soft.  Neurological: She is alert and oriented to person, place, and time. She has normal strength. No cranial nerve deficit or sensory deficit. She displays a negative Romberg sign.  Skin: Skin is warm and dry.    ED Course   Procedures (including critical care time)  Labs Reviewed - No data to display No results found. 1. Migraine  MDM  Pts symptoms resolved with IV Toradol, benadryl and Decadron and she requested to leave since she was feeling better. Benign neuro exam with symptoms being consistent with a simple migraine.   21 y.o.Connie Cobb's evaluation in the Emergency Department is complete. It has been determined that no acute conditions requiring further emergency intervention are present at this time. The patient/guardian have been advised of the diagnosis and plan. We have discussed signs and symptoms that warrant return to the ED, such as changes or worsening in symptoms.  Vital signs are stable at discharge. Filed Vitals:   02/23/13 0221  BP: 106/60  Pulse: 79  Temp: 98.2 F (36.8 C)  Resp: 12    Patient/guardian has voiced understanding and agreed to follow-up with the PCP or  specialist.    Dorthula Matas, PA-C 02/26/13 0730

## 2013-02-22 NOTE — ED Notes (Signed)
Pt reports increase in headaches since having depo shot. Was put on BP meds d/t increase in BP but headaches continued. BP med was switched to Topamax. Headache still remains. Pt reports nausea, light sensitivity, and sensitivity to noise. No mediations taken for pain.

## 2013-02-25 ENCOUNTER — Ambulatory Visit: Payer: BC Managed Care – PPO | Admitting: Family Medicine

## 2013-02-28 NOTE — ED Provider Notes (Signed)
Medical screening examination/treatment/procedure(s) were performed by non-physician practitioner and as supervising physician I was immediately available for consultation/collaboration.   Gerhard Munch, MD 02/28/13 [phone]

## 2013-05-08 ENCOUNTER — Other Ambulatory Visit: Payer: Self-pay

## 2013-07-24 ENCOUNTER — Encounter (HOSPITAL_COMMUNITY): Payer: Self-pay | Admitting: Emergency Medicine

## 2013-07-24 ENCOUNTER — Emergency Department (HOSPITAL_COMMUNITY)
Admission: EM | Admit: 2013-07-24 | Discharge: 2013-07-24 | Disposition: A | Discharge status: Home / Self Care | Payer: BC Managed Care – PPO | Attending: Emergency Medicine | Admitting: Emergency Medicine

## 2013-07-24 DIAGNOSIS — Z79899 Other long term (current) drug therapy: Secondary | ICD-10-CM | POA: Insufficient documentation

## 2013-07-24 DIAGNOSIS — K649 Unspecified hemorrhoids: Secondary | ICD-10-CM

## 2013-07-24 DIAGNOSIS — K644 Residual hemorrhoidal skin tags: Secondary | ICD-10-CM | POA: Insufficient documentation

## 2013-07-24 DIAGNOSIS — Z3202 Encounter for pregnancy test, result negative: Secondary | ICD-10-CM | POA: Insufficient documentation

## 2013-07-24 DIAGNOSIS — Z862 Personal history of diseases of the blood and blood-forming organs and certain disorders involving the immune mechanism: Secondary | ICD-10-CM | POA: Insufficient documentation

## 2013-07-24 LAB — POCT PREGNANCY, URINE: Preg Test, Ur: NEGATIVE

## 2013-07-24 LAB — URINE MICROSCOPIC-ADD ON

## 2013-07-24 LAB — URINALYSIS, ROUTINE W REFLEX MICROSCOPIC
BILIRUBIN URINE: NEGATIVE
Glucose, UA: NEGATIVE mg/dL
KETONES UR: NEGATIVE mg/dL
Nitrite: NEGATIVE
PROTEIN: NEGATIVE mg/dL
SPECIFIC GRAVITY, URINE: 1.017 (ref 1.005–1.030)
UROBILINOGEN UA: 0.2 mg/dL (ref 0.0–1.0)
pH: 6.5 (ref 5.0–8.0)

## 2013-07-24 MED ORDER — NITROGLYCERIN 2 % TD OINT
1.0000 [in_us] | TOPICAL_OINTMENT | Freq: Once | TRANSDERMAL | Status: AC
  Administered 2013-07-24: 1 [in_us] via TOPICAL
  Filled 2013-07-24: qty 30

## 2013-07-24 MED ORDER — HYDROCODONE-ACETAMINOPHEN 5-325 MG PO TABS: 1.0000 | ORAL_TABLET | ORAL | Status: DC | PRN

## 2013-07-24 MED ORDER — LIDOCAINE VISCOUS 2 % MT SOLN
15.0000 mL | Freq: Once | OROMUCOSAL | Status: AC
  Administered 2013-07-24: 15 mL via OROMUCOSAL
  Filled 2013-07-24 (×2): qty 15

## 2013-07-24 NOTE — Discharge Instructions (Signed)
Continue to use a stool softener such as docusate sodium.  Try over the counter preparation H.  You can apply a mixture of lidocaine and nitro paste to the hemorrhoid to help shrink it.  Take vicodin if absolutely necessary.  Do not drive within four hours of taking this medication (may cause drowsiness or confusion).   Follow up with General Surgery asap.   Return to the ER if you have worsening pain, difficulty having a bowel movement, rectal bleeding or fever.

## 2013-07-24 NOTE — ED Provider Notes (Signed)
CSN: [id number]     Arrival date & time 07/24/13  1913 History   This chart was scribed for non-physician practitioner Kyung Bacca, PA-C working with Richardean Canal, MD by Joaquin Music, ED Scribe. This patient was seen in room WTR7/WTR7 and the patient's care was started at  9:13 PM  Chief Complaint  Patient presents with  . Hemorrhoids   The history is provided by the patient. No language interpreter was used.   HPI Comments: Connie Cobb is a 23 y.o. female who presents to the Emergency Department complaining of possible hemorrhoids and nausea that began about 2 months ago. Pt states she has tried Tux wipes, Suppository, Preparation H, Bath Salts and states she has pushed her hemorrhoids up her bowels several times. Pt states she stands for long durations during the day due to work. She states she has been waking-up in her sleep due to pain in rectal area. Pt denies hx of constipation but states she has been having pain when she is having a BM lately. Pt denies hematochezia, diarrhea, emesis and fevers. Pt denies pregnancy.  Pt sates she has been having frequency recently. Pt states her LNMP was December 2014. Pt denies burning and hematuria.  Past Medical History  Diagnosis Date  . Anemia    Past Surgical History  Procedure Laterality Date  . Tonsillectomy     Family History  Problem Relation Age of Onset  . Diabetes      grandparent  . Breast cancer      grandmother   History  Substance Use Topics  . Smoking status: Never Smoker   . Smokeless tobacco: Not on file  . Alcohol Use: No   OB History   Grav Para Term Preterm Abortions TAB SAB Ect Mult Living                 Review of Systems  All other systems reviewed and are negative.   Allergies  Sulfonamide derivatives  Home Medications   Current Outpatient Rx  Name  Route  Sig  Dispense  Refill  . amphetamine-dextroamphetamine (ADDERALL) 20 MG tablet   Oral   Take 20 mg by mouth 2 (two) times  daily.         . hydrocortisone cream (PREPARATION H HYDROCORTISONE) 1 %   Topical   Apply 1 application topically 2 (two) times daily as needed for itching (hemrroids).         . shark liver oil-cocoa butter (PREPARATION H) 0.25-3-85.5 % suppository   Rectal   Place 1 suppository rectally as needed for hemorrhoids.         [name] (TUCKS) 50 % PADS   Apply externally   Apply 1 application topically as needed (hemrroids).         . topiramate (TOPAMAX) 25 MG tablet   Oral   Take 25 mg by mouth at bedtime.          BP 113/90  Pulse 97  Temp(Src) 97.8 F (36.6 C) (Oral)  Resp 20  SpO2 100%  Physical Exam  Nursing note and vitals reviewed. Constitutional: She is oriented to person, place, and time. She appears well-developed and well-nourished. No distress.  HENT:  Head: Normocephalic and atraumatic.  Eyes:  Normal appearance  Neck: Normal range of motion.  Cardiovascular: Normal rate and regular rhythm.   Pulmonary/Chest: Effort normal and breath sounds normal. No respiratory distress.  Abdominal: Soft. Bowel sounds are normal. She exhibits no distension and no mass.  There is no tenderness. There is no rebound and no guarding.  Genitourinary:  2 non-thrombosed, non-tender external hemorrhiods.  Painful digital rectal exam and there is mild fullness that I suspect is an internal hemorrhoid.  No gross blood in rectum.  Nml stool color.   Musculoskeletal: Normal range of motion.  Neurological: She is alert and oriented to person, place, and time.  Skin: Skin is warm and dry. No rash noted.  Psychiatric: She has a normal mood and affect. Her behavior is normal.    ED Course  Procedures  DIAGNOSTIC STUDIES: Oxygen Saturation is 100% on RA, normal by my interpretation.    COORDINATION OF CARE: 9:21 PM-Discussed treatment plan which includes UA and pregnancy. Advised pt F/U with surgery is necessary. Pt agreed to plan.   Labs Review Labs Reviewed - No data  to display Imaging Review No results found.  EKG Interpretation   None       MDM   1. Hemorrhoid    22yo F presents w/ 2 months stable rectal discomfort.  Has a hemorrhoid that prolapses intermittently.  No hematochezia or rectal bleeding.  2 non-thrombosed external hemorrhoids and painful digital rectal exam w/out gross blood.  Suspect that her pain is d/t an internal hemorrhoid.  Doubt rectal abscess d/t duration of sx as well as the fact that pt is afebrile and well appearing.  She has already been using preparation H, a stool softener, tuck's wipes and sitz baths.  Dr. Silverio LayYao recommended viscous lidocaine mixed w/ nitro paste.  I referred to GS.  Return precautions discussed.   I personally performed the services described in this documentation, which was scribed in my presence. The recorded information has been reviewed and is accurate.    Otilio Miuatherine E Mahagony Grieb, PA-C 07/25/13 [phone]

## 2013-07-24 NOTE — ED Notes (Signed)
Initial contact - pt resting in chair, reports c/o external hemorrhoids x2 months "trying to take care of it at home" with OTC meds.  Pt reports no improvement, reports discomfort to area.  Denies further needs/complaints at this time, nad awaiting EDP eval.

## 2013-07-24 NOTE — ED Notes (Signed)
Pt presents with c/o hemorrhoids. Pt says she has been trying to take care of them at home for about 2 months but is unable to handle them any longer. Pt says they are external hemorrhoids.

## 2013-07-25 NOTE — ED Provider Notes (Signed)
Medical screening examination/treatment/procedure(s) were performed by non-physician practitioner and as supervising physician I was immediately available for consultation/collaboration.  EKG Interpretation   None         Richardean Canalavid H Kindall Swaby, MD 07/25/13 [phone]

## 2013-07-29 ENCOUNTER — Ambulatory Visit (INDEPENDENT_AMBULATORY_CARE_PROVIDER_SITE_OTHER): Payer: BC Managed Care – PPO | Admitting: General Surgery

## 2013-07-29 ENCOUNTER — Encounter (INDEPENDENT_AMBULATORY_CARE_PROVIDER_SITE_OTHER): Payer: Self-pay | Admitting: General Surgery

## 2013-07-29 VITALS — BP 110/70 | HR 68 | Resp 16 | Ht 67.0 in | Wt 132.0 lb

## 2013-07-29 DIAGNOSIS — K644 Residual hemorrhoidal skin tags: Secondary | ICD-10-CM

## 2013-07-29 MED ORDER — HYDROCORTISONE ACETATE 25 MG RE SUPP: 25.0000 mg | Freq: Two times a day (BID) | RECTAL | Status: DC

## 2013-07-29 NOTE — Progress Notes (Signed)
Subjective:     Patient ID: Connie Cobb, female   DOB: 01/05/1991, 23 y.o.   MRN: [id number]  HPI The patient is a 23 year old female who presents today for an evaluation of external hemorrhoids and hemorrhoid pain. The patient states that she's had these for approximately one to 2 years. She states that over the last several months the pain has gotten worse. 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  Patient states she has no blood with her bowel movements. She states that she cannot take any supplemental fiber. She admits to not status hydrated she should. She is taking Colace as stool soccer at this time.  Review of Systems  Constitutional: Negative.   HENT: Negative.   Respiratory: Negative.   Cardiovascular: Negative.   Gastrointestinal: Negative.   Neurological: Negative.   All other systems reviewed and are negative.       Objective:   Physical Exam  Constitutional: She is oriented to person, place, and time. She appears well-developed and well-nourished.  HENT:  Head: Normocephalic and atraumatic.  Eyes: Conjunctivae and EOM are normal. Pupils are equal, round, and reactive to light.  Neck: Normal range of motion. Neck supple.  Cardiovascular: Normal rate, regular rhythm and normal heart sounds.   Pulmonary/Chest: Effort normal and breath sounds normal.  Abdominal: Soft. Bowel sounds are normal. She exhibits no distension and no mass. There is no tenderness. There is no rebound and no guarding.  Genitourinary:     Musculoskeletal: Normal range of motion.  Neurological: She is alert and oriented to person, place, and time.  Skin: Skin is warm and dry.  Psychiatric: She has a normal mood and affect.       Assessment:     23 year old female with external hemorrhoids     Plan:     1. I discussed with her the pathophysiology of hemorrhoids and the need to avoid straining with bowel  movements. I do believe that her sitting and studying for test is probably inflamed the hemorrhoids.  2. I recommended supplementing her fiber with Metamucil/psyllium fiber. Also recommend getting hydrated with a water throughout the day. 3. On the patient a prescription for Anusol suppositories to help with the inflammation and pain. 4. Also recommend the patient potentially purchase a donut to sit on to see if disposition dramatic relief. The patient may need a note and called for a note to be will take this down into the test.

## 2013-11-13 ENCOUNTER — Emergency Department (HOSPITAL_COMMUNITY): Payer: BC Managed Care – PPO

## 2013-11-13 ENCOUNTER — Encounter (HOSPITAL_COMMUNITY): Payer: Self-pay | Admitting: Emergency Medicine

## 2013-11-13 ENCOUNTER — Emergency Department (HOSPITAL_COMMUNITY)
Admission: EM | Admit: 2013-11-13 | Discharge: 2013-11-13 | Disposition: A | Discharge status: Home / Self Care | Payer: BC Managed Care – PPO | Attending: Emergency Medicine | Admitting: Emergency Medicine

## 2013-11-13 ENCOUNTER — Other Ambulatory Visit: Payer: Self-pay

## 2013-11-13 DIAGNOSIS — R059 Cough, unspecified: Secondary | ICD-10-CM

## 2013-11-13 DIAGNOSIS — IMO0002 Reserved for concepts with insufficient information to code with codable children: Secondary | ICD-10-CM | POA: Diagnosis not present

## 2013-11-13 DIAGNOSIS — M549 Dorsalgia, unspecified: Secondary | ICD-10-CM | POA: Insufficient documentation

## 2013-11-13 DIAGNOSIS — Z79899 Other long term (current) drug therapy: Secondary | ICD-10-CM | POA: Diagnosis not present

## 2013-11-13 DIAGNOSIS — J310 Chronic rhinitis: Secondary | ICD-10-CM | POA: Diagnosis not present

## 2013-11-13 DIAGNOSIS — R0602 Shortness of breath: Secondary | ICD-10-CM | POA: Diagnosis present

## 2013-11-13 DIAGNOSIS — Z862 Personal history of diseases of the blood and blood-forming organs and certain disorders involving the immune mechanism: Secondary | ICD-10-CM | POA: Diagnosis not present

## 2013-11-13 DIAGNOSIS — R05 Cough: Secondary | ICD-10-CM

## 2013-11-13 MED ORDER — ALBUTEROL SULFATE HFA 108 (90 BASE) MCG/ACT IN AERS
2.0000 | INHALATION_SPRAY | Freq: Once | RESPIRATORY_TRACT | Status: DC
  Filled 2013-11-13: qty 6.7

## 2013-11-13 MED ORDER — GUAIFENESIN ER 600 MG PO TB12: 600.0000 mg | ORAL_TABLET | Freq: Two times a day (BID) | ORAL | Status: DC

## 2013-11-13 MED ORDER — FLUTICASONE PROPIONATE 50 MCG/ACT NA SUSP: 2.0000 | Freq: Every day | NASAL | Status: DC

## 2013-11-13 MED ORDER — ALBUTEROL SULFATE (2.5 MG/3ML) 0.083% IN NEBU
5.0000 mg | INHALATION_SOLUTION | Freq: Once | RESPIRATORY_TRACT | Status: AC
  Administered 2013-11-13: 5 mg via RESPIRATORY_TRACT
  Filled 2013-11-13: qty 6

## 2013-11-13 NOTE — Discharge Instructions (Signed)
Take Mucinex as directed. Use Flonase as directed. Use albuterol inhaler every 4-6 hours as needed for cough.  Cough, Adult  A cough is a reflex that helps clear your throat and airways. It can help heal the body or may be a reaction to an irritated airway. A cough may only last 2 or 3 weeks (acute) or may last more than 8 weeks (chronic).  CAUSES Acute cough:  Viral or bacterial infections. Chronic cough:  Infections.  Allergies.  Asthma.  Post-nasal drip.  Smoking.  Heartburn or acid reflux.  Some medicines.  Chronic lung problems (COPD).  Cancer. SYMPTOMS   Cough.  Fever.  Chest pain.  Increased breathing rate.  High-pitched whistling sound when breathing (wheezing).  Colored mucus that you cough up (sputum). TREATMENT   A bacterial cough may be treated with antibiotic medicine.  A viral cough must run its course and will not respond to antibiotics.  Your caregiver may recommend other treatments if you have a chronic cough. HOME CARE INSTRUCTIONS   Only take over-the-counter or prescription medicines for pain, discomfort, or fever as directed by your caregiver. Use cough suppressants only as directed by your caregiver.  Use a cold steam vaporizer or humidifier in your bedroom or home to help loosen secretions.  Sleep in a semi-upright position if your cough is worse at night.  Rest as needed.  Stop smoking if you smoke. SEEK IMMEDIATE MEDICAL CARE IF:   You have pus in your sputum.  Your cough starts to worsen.  You cannot control your cough with suppressants and are losing sleep.  You begin coughing up blood.  You have difficulty breathing.  You develop pain which is getting worse or is uncontrolled with medicine.  You have a fever. MAKE SURE YOU:   Understand these instructions.  Will watch your condition.  Will get help right away if you are not doing well or get worse. Document Released: 12/16/2010 Document Revised: 09/11/2011  Document Reviewed: 12/16/2010 [facility] Patient Information 2014 [place], [place].  Upper Respiratory Infection, Adult An upper respiratory infection (URI) is also sometimes known as the common cold. The upper respiratory tract includes the nose, sinuses, throat, trachea, and bronchi. Bronchi are the airways leading to the lungs. Most people improve within 1 week, but symptoms can last up to 2 weeks. A residual cough may last even longer.  CAUSES Many different viruses can infect the tissues lining the upper respiratory tract. The tissues become irritated and inflamed and often become very moist. Mucus production is also common. A cold is contagious. You can easily spread the virus to others by oral contact. This includes kissing, sharing a glass, coughing, or sneezing. Touching your mouth or nose and then touching a surface, which is then touched by another person, can also spread the virus. SYMPTOMS  Symptoms typically develop 1 to 3 days after you come in contact with a cold virus. Symptoms vary from person to person. They may include:  Runny nose.  Sneezing.  Nasal congestion.  Sinus irritation.  Sore throat.  Loss of voice (laryngitis).  Cough.  Fatigue.  Muscle aches.  Loss of appetite.  Headache.  Low-grade fever. DIAGNOSIS  You might diagnose your own cold based on familiar symptoms, since most people get a cold 2 to 3 times a year. Your caregiver can confirm this based on your exam. Most importantly, your caregiver can check that your symptoms are not due to another disease such as strep throat, sinusitis, pneumonia, asthma, or epiglottitis. Blood  tests, throat tests, and X-rays are not necessary to diagnose a common cold, but they may sometimes be helpful in excluding other more serious diseases. Your caregiver will decide if any further tests are required. RISKS AND COMPLICATIONS  You may be at risk for a more severe case of the common cold if you smoke cigarettes, have  chronic heart disease (such as heart failure) or lung disease (such as asthma), or if you have a weakened immune system. The very young and very old are also at risk for more serious infections. Bacterial sinusitis, middle ear infections, and bacterial pneumonia can complicate the common cold. The common cold can worsen asthma and chronic obstructive pulmonary disease (COPD). Sometimes, these complications can require emergency medical care and may be life-threatening. PREVENTION  The best way to protect against getting a cold is to practice good hygiene. Avoid oral or hand contact with people with cold symptoms. Wash your hands often if contact occurs. There is no clear evidence that vitamin C, vitamin E, echinacea, or exercise reduces the chance of developing a cold. However, it is always recommended to get plenty of rest and practice good nutrition. TREATMENT  Treatment is directed at relieving symptoms. There is no cure. Antibiotics are not effective, because the infection is caused by a virus, not by bacteria. Treatment may include:  Increased fluid intake. Sports drinks offer valuable electrolytes, sugars, and fluids.  Breathing heated mist or steam (vaporizer or shower).  Eating chicken soup or other clear broths, and maintaining good nutrition.  Getting plenty of rest.  Using gargles or lozenges for comfort.  Controlling fevers with ibuprofen or acetaminophen as directed by your caregiver.  Increasing usage of your inhaler if you have asthma. Zinc gel and zinc lozenges, taken in the first 24 hours of the common cold, can shorten the duration and lessen the severity of symptoms. Pain medicines may help with fever, muscle aches, and throat pain. A variety of non-prescription medicines are available to treat congestion and runny nose. Your caregiver can make recommendations and may suggest nasal or lung inhalers for other symptoms.  HOME CARE INSTRUCTIONS   Only take over-the-counter or  prescription medicines for pain, discomfort, or fever as directed by your caregiver.  Use a warm mist humidifier or inhale steam from a shower to increase air moisture. This may keep secretions moist and make it easier to breathe.  Drink enough water and fluids to keep your urine clear or pale yellow.  Rest as needed.  Return to work when your temperature has returned to normal or as your caregiver advises. You may need to stay home longer to avoid infecting others. You can also use a face mask and careful hand washing to prevent spread of the virus. SEEK MEDICAL CARE IF:   After the first few days, you feel you are getting worse rather than better.  You need your caregiver's advice about medicines to control symptoms.  You develop chills, worsening shortness of breath, or brown or red sputum. These may be signs of pneumonia.  You develop yellow or brown nasal discharge or pain in the face, especially when you bend forward. These may be signs of sinusitis.  You develop a fever, swollen neck glands, pain with swallowing, or Stencil areas in the back of your throat. These may be signs of strep throat. SEEK IMMEDIATE MEDICAL CARE IF:   You have a fever.  You develop severe or persistent headache, ear pain, sinus pain, or chest pain.  You develop  wheezing, a prolonged cough, cough up blood, or have a change in your usual mucus (if you have chronic lung disease).  You develop sore muscles or a stiff neck. Document Released: 12/13/2000 Document Revised: 09/11/2011 Document Reviewed: 10/21/2010 [facility] Patient Information 2014 [place] SpringsExitCare, [place].

## 2013-11-13 NOTE — ED Notes (Signed)
Pt to CXR.

## 2013-11-13 NOTE — ED Notes (Signed)
Pt declines taking albuterol inhaler at this time.  Instructed on use and verbalized understanding.

## 2013-11-13 NOTE — ED Notes (Signed)
Initial Contact - pt to RM18 with mom with c/o cough x1 month and SOB.  Pt sts "i thought it was allergies or laryngitis".  Pt reports worsening SOB and also cough was prod green sputum yesterday.  Pt reports SOB worse with exertion.  Ambulatory without issue.  MAEI.  Skin PWD.  Pt denies CP currently, reports only with cough.  Pt also c/o upper/mid back pain.  A+OX4.  Changed to hospital gown.  NAD.

## 2013-11-13 NOTE — ED Provider Notes (Signed)
CSN: [id number]     Arrival date & time 11/13/13  1744 History   First MD Initiated Contact with Patient 11/13/13 1820     Chief Complaint  Patient presents with  . Shortness of Breath  . Back Pain     (Consider location/radiation/quality/duration/timing/severity/associated sxs/prior Treatment) HPI Comments: 23 year old female with a past medical history of anemia presents to the emergency department complaining of cough x1 month and shortness of breath times one week. Cough has been dry until this morning when she began coughing up green phlegm. Chest pain only present while coughing. She is congested. Denies wheezing or fevers. No sick contacts. She tried taking over-the-counter DayQuil with cough medicine and last night with no relief.  Patient is a 23 y.o. female presenting with shortness of breath and back pain. The history is provided by the patient.  Shortness of Breath Associated symptoms: cough   Back Pain   Past Medical History  Diagnosis Date  . Anemia    Past Surgical History  Procedure Laterality Date  . Tonsillectomy     Family History  Problem Relation Age of Onset  . Diabetes      grandparent  . Breast cancer      grandmother   History  Substance Use Topics  . Smoking status: Never Smoker   . Smokeless tobacco: Not on file  . Alcohol Use: No   OB History   Grav Para Term Preterm Abortions TAB SAB Ect Mult Living                 Review of Systems  HENT: Positive for congestion.   Respiratory: Positive for cough and shortness of breath.   Musculoskeletal: Positive for back pain.  All other systems reviewed and are negative.     Allergies  Sulfonamide derivatives  Home Medications   Prior to Admission medications   Medication Sig Start Date End Date Taking? Authorizing Provider  amphetamine-dextroamphetamine (ADDERALL) 20 MG tablet Take 20 mg by mouth 2 (two) times daily.    Historical Provider, MD  HYDROcodone-acetaminophen (NORCO/VICODIN)  5-325 MG per tablet Take 1 tablet by mouth every 4 (four) hours as needed for moderate pain. 07/24/13   Arie Sabinaatherine E Schinlever, PA-C  hydrocortisone (ANUSOL-HC) 25 MG suppository Place 1 suppository (25 mg total) rectally 2 (two) times daily. 07/29/13   Axel FillerArmando Ramirez, MD  hydrocortisone cream (PREPARATION H HYDROCORTISONE) 1 % Apply 1 application topically 2 (two) times daily as needed for itching (hemrroids).    Historical Provider, MD  shark liver oil-cocoa butter (PREPARATION H) 0.25-3-85.5 % suppository Place 1 suppository rectally as needed for hemorrhoids.    Historical Provider, MD  topiramate (TOPAMAX) 25 MG tablet Take 25 mg by mouth at bedtime.    Historical Provider, MD  Witch Hazel (TUCKS) 50 % PADS Apply 1 application topically as needed (hemrroids).    Historical Provider, MD   BP 123/84  Pulse 94  Temp(Src) 98.2 F (36.8 C) (Oral)  Resp 22  SpO2 100% Physical Exam  Nursing note and vitals reviewed. Constitutional: She is oriented to person, place, and time. She appears well-developed and well-nourished. No distress.  HENT:  Head: Normocephalic and atraumatic.  Nose: Mucosal edema present.  Mouth/Throat: Oropharynx is clear and moist.  Post nasal drip.  Eyes: Conjunctivae are normal.  Neck: Normal range of motion. Neck supple.  Cardiovascular: Normal rate, regular rhythm and normal heart sounds.   Pulmonary/Chest: Effort normal and breath sounds normal. No respiratory distress. She has no wheezes.  Dry cough present.  Abdominal: Soft. Bowel sounds are normal. There is no tenderness.  Musculoskeletal: Normal range of motion. She exhibits no edema.  Neurological: She is alert and oriented to person, place, and time.  Skin: Skin is warm and dry. She is not diaphoretic.  Psychiatric: She has a normal mood and affect. Her behavior is normal.    ED Course  Procedures (including critical care time) Labs Review Labs Reviewed - No data to display  Imaging Review Dg Chest 2  View  11/13/2013   CLINICAL DATA:  Shortness of breath and back pain.  EXAM: CHEST  2 VIEW  COMPARISON:  None.  FINDINGS: The heart size and mediastinal contours are within normal limits. Both lungs are clear. The visualized skeletal structures are unremarkable.  IMPRESSION: No active cardiopulmonary disease.   Electronically Signed   By: Elberta Fortisaniel  Boyle M.D.   On: 11/13/2013 19:00     EKG Interpretation None      MDM   Final diagnoses:  Cough  Rhinitis   Patient presenting with one month of cough, one week of shortness of breath. She is well appearing and in no apparent distress. Afebrile, vital signs stable. Lungs clear. Dry cough present on exam. Given patient's symptoms present for one month, will obtain chest x-ray. Plan to give nebulizer treatment. 7:44 PM Chest x-ray clear. Patient reports improvement after receiving nebulizer treatment. Will discharge patient with albuterol inhaler, Flonase and Mucinex. F/u with PCP. Stable for discharge. Return precautions given. Patient states understanding of treatment care plan and is agreeable.   Trevor MaceRobyn M Albert, PA-C 11/13/13 1944

## 2013-11-13 NOTE — ED Notes (Signed)
Pt c/o cough x 1 month, SOB x 1 week, back pain x 2 days. C/o chest pain w/ coughing.

## 2013-11-16 NOTE — ED Provider Notes (Signed)
Medical screening examination/treatment/procedure(s) were performed by non-physician practitioner and as supervising physician I was immediately available for consultation/collaboration.   Toy BakerAnthony T Shakea Isip, MD 11/16/13 [phone]

## 2014-06-15 ENCOUNTER — Encounter: Payer: Self-pay | Admitting: Family Medicine

## 2014-06-15 ENCOUNTER — Ambulatory Visit (INDEPENDENT_AMBULATORY_CARE_PROVIDER_SITE_OTHER): Payer: BC Managed Care – PPO | Admitting: Family Medicine

## 2014-06-15 VITALS — BP 110/74 | HR 95 | Temp 98.3°F | Resp 16 | Wt 154.5 lb

## 2014-06-15 DIAGNOSIS — J0100 Acute maxillary sinusitis, unspecified: Secondary | ICD-10-CM

## 2014-06-15 DIAGNOSIS — J302 Other seasonal allergic rhinitis: Secondary | ICD-10-CM

## 2014-06-15 DIAGNOSIS — H9311 Tinnitus, right ear: Secondary | ICD-10-CM

## 2014-06-15 MED ORDER — PREDNISONE 10 MG PO TABS: ORAL_TABLET | ORAL | Status: DC

## 2014-06-15 MED ORDER — CETIRIZINE HCL 10 MG PO TABS: 10.0000 mg | ORAL_TABLET | Freq: Every day | ORAL | Status: DC

## 2014-06-15 NOTE — Assessment & Plan Note (Signed)
Deteriorated.  Pt to start daily antihistamine.  Short course pred taper.  Reviewed supportive care and red flags that should prompt return.  Pt expressed understanding and is in agreement w/ plan.

## 2014-06-15 NOTE — Patient Instructions (Signed)
Follow up via phone or MyChart in 2-3 weeks and let me know if the pulsating in your ear has improved (if not, we'll refer you to ENT) Start Zyrtec daily to decrease congestion and pressure in the ears Take the Prednisone as directed- take w/ food (take 3 pills at the same time, then 2 at the same time, then 1) Drink plenty of fluids Call with any questions or concerns Happy Holidays!!

## 2014-06-15 NOTE — Assessment & Plan Note (Signed)
Pt's sxs are more consistent w/ inflammation rather than infection.  Start Pred taper.  Drink plenty of fluids.  Daily antihistamine to improve sxs.  Reviewed supportive care and red flags that should prompt return.  Pt expressed understanding and is in agreement w/ plan.

## 2014-06-15 NOTE — Assessment & Plan Note (Signed)
New.  Suspect this is due to pressure due to untreated nasal congestion/seasonal allergies.  Will attempt to treat underlying causes to see if sxs improve/resolve.  If use of prednisone, antihistamines don't improve pt's sxs, will refer to ENT.  Pt expressed understanding and is in agreement w/ plan.

## 2014-06-15 NOTE — Progress Notes (Signed)
Pre visit review using our clinic review tool, if applicable. No additional management support is needed unless otherwise documented below in the visit note. 

## 2014-06-15 NOTE — Progress Notes (Signed)
   Subjective:    Patient ID: Connie Cobb, female    DOB: 06/27/1991, 23 y.o.   MRN: [id number]  HPI Ringing in ear- R ear, sxs started in August.  No pain.  Pt reports fullness- worse w/ lying down, 'i can hear it'.  Pt reports hearing a 'whooshing', pulsating in ear.  No symptoms in L.  Some nasal congestion.  + seasonal allergies.  Not currently on meds.  Denies cough, dizziness.  No fevers.  No known sick contacts.   Review of Systems For ROS see HPI     Objective:   Physical Exam  Constitutional: She appears well-developed and well-nourished. No distress.  HENT:  Head: Normocephalic and atraumatic.  Right Ear: Tympanic membrane is retracted.  Left Ear: Tympanic membrane is retracted (mild compared to R).  Nose: Mucosal edema and rhinorrhea present. Right sinus exhibits maxillary sinus tenderness. Right sinus exhibits no frontal sinus tenderness. Left sinus exhibits maxillary sinus tenderness. Left sinus exhibits no frontal sinus tenderness.  Mouth/Throat: Mucous membranes are normal. Posterior oropharyngeal erythema (w/ PND) present.  + allergic crease  Eyes: Conjunctivae and EOM are normal. Pupils are equal, round, and reactive to light.  Neck: Normal range of motion. Neck supple.  Cardiovascular: Normal rate, regular rhythm and normal heart sounds.   Pulmonary/Chest: Effort normal and breath sounds normal. No respiratory distress. She has no wheezes. She has no rales.  Lymphadenopathy:    She has no cervical adenopathy.  Vitals reviewed.         Assessment & Plan:

## 2014-07-22 IMAGING — CT CT HEAD W/O CM
2 series · 16 of 30 positions shown, 20 images · non-contrast
Comparison: None.

CLINICAL DATA: Headaches for 2 weeks.  Light sensitivity.

CT HEAD WITHOUT CONTRAST
TECHNIQUE: Contiguous axial images were obtained from the base of
the skull through the vertex without contrast

[Series 2: head w/o · axial · non-contrast · 0.45mm/px · z∈[+1121,+1241]mm · 13 of 29 slices shown, 17 images]
[im 3/29  brain]
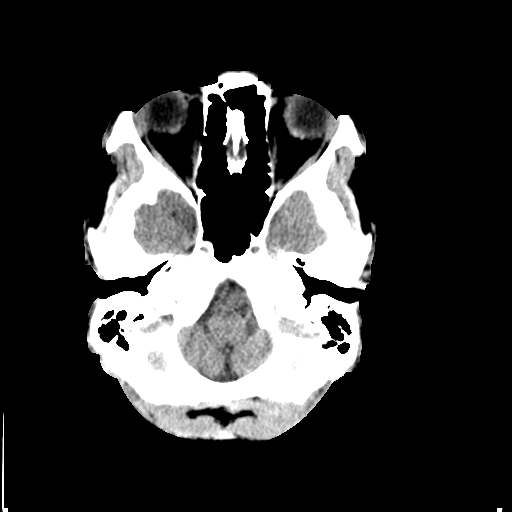
[im 3/29  bone]
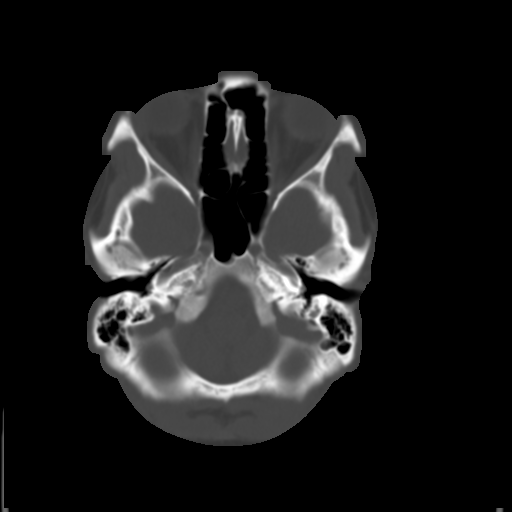
[im 5/29  brain]
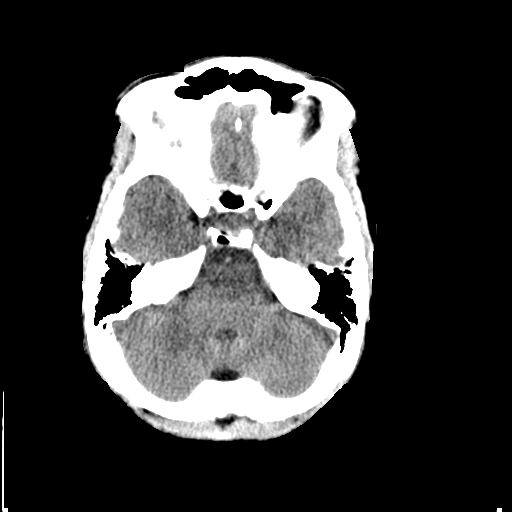
[im 7/29  brain]
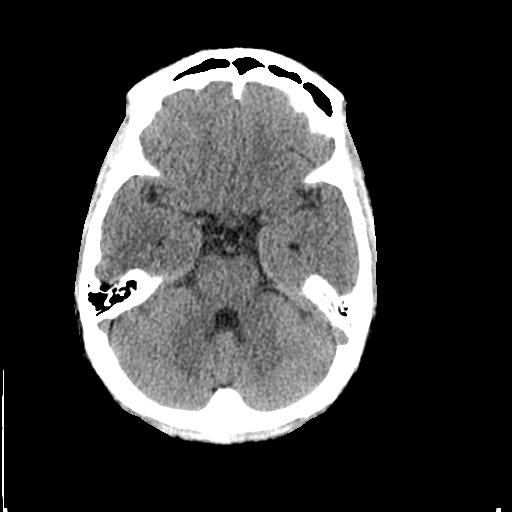
[im 9/29  brain]
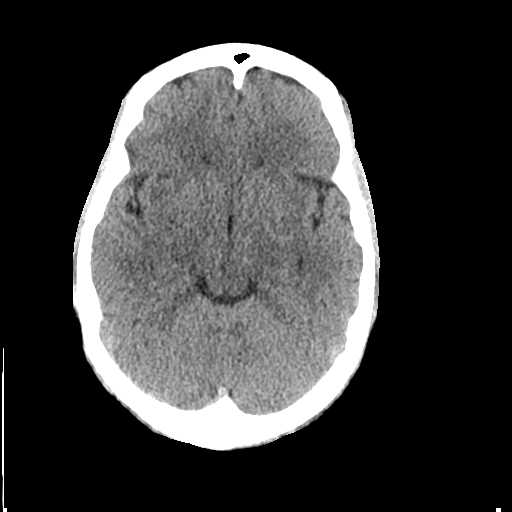
[im 11/29  brain]
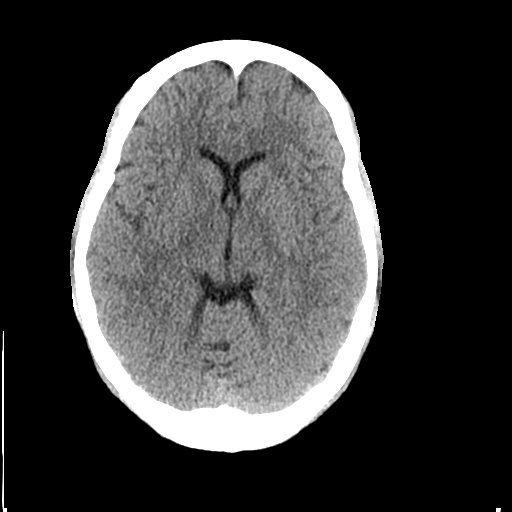
[im 11/29  bone]
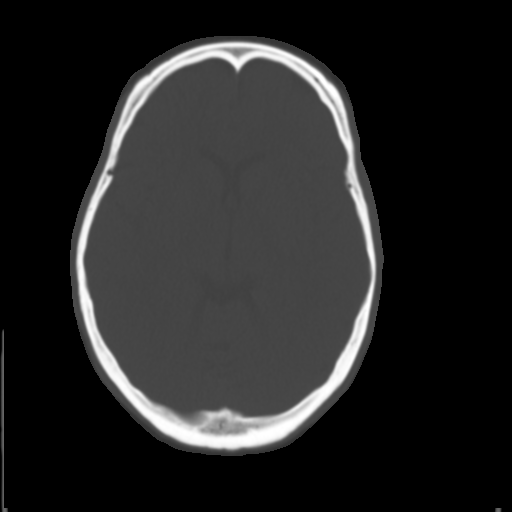
[im 13/29  brain]
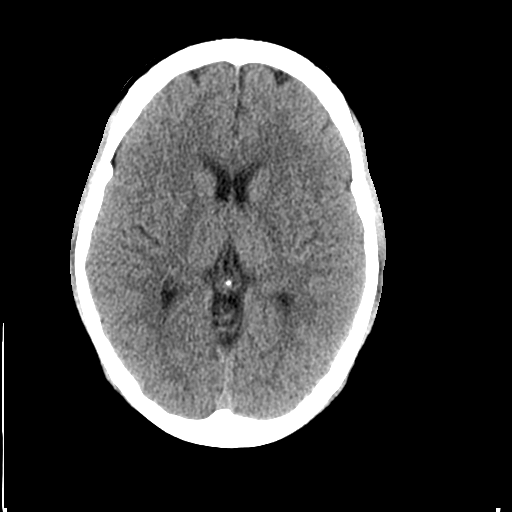
[im 15/29  brain]
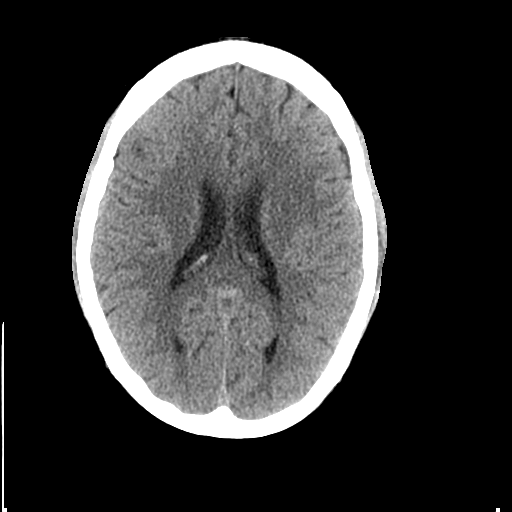
[im 17/29  brain]
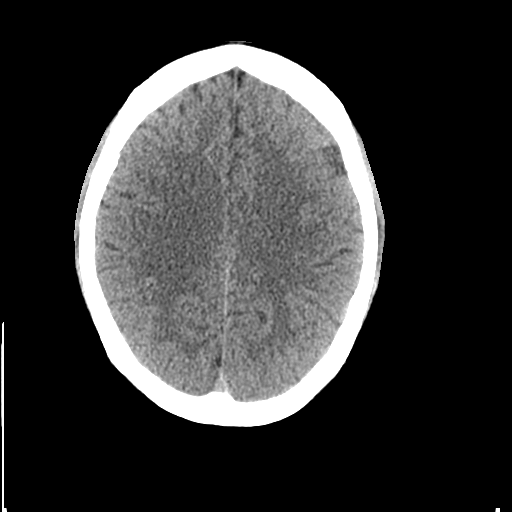
[im 19/29  brain]
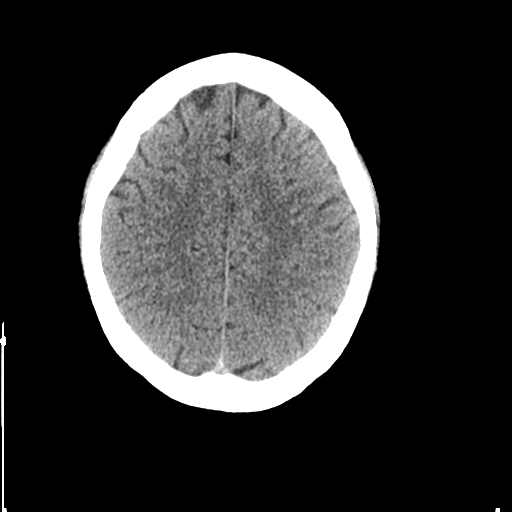
[im 19/29  bone]
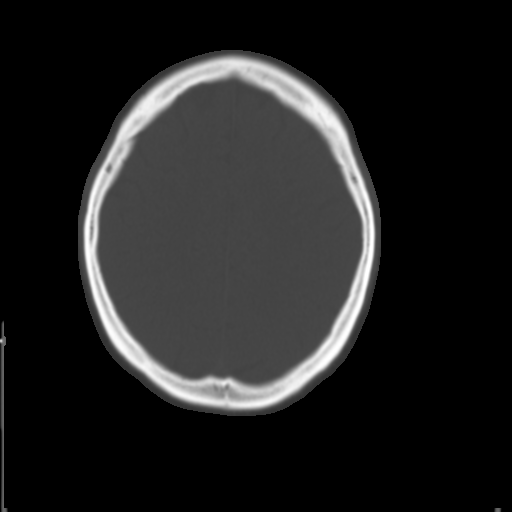
[im 21/29  brain]
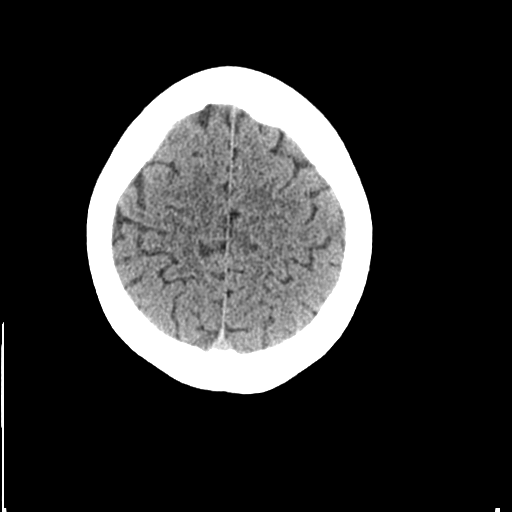
[im 23/29  brain]
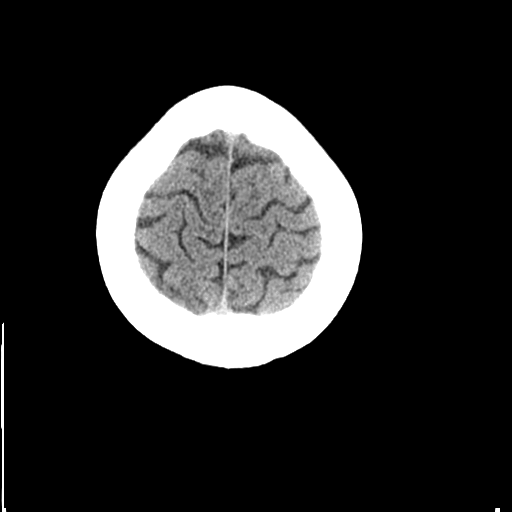
[im 25/29  brain]
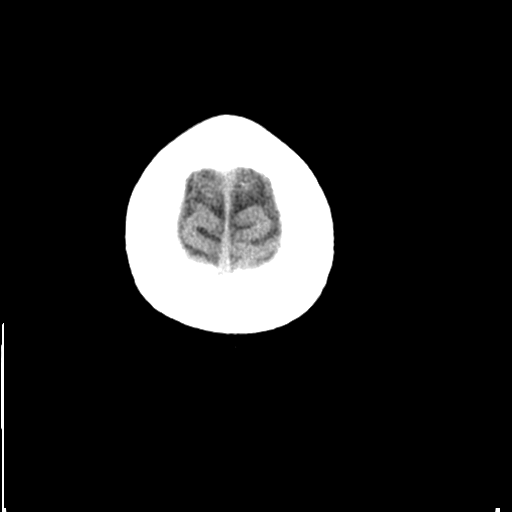
[im 27/29  brain]
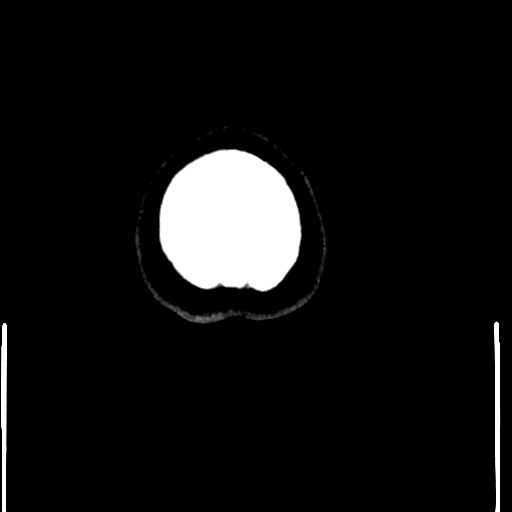
[im 27/29  bone]
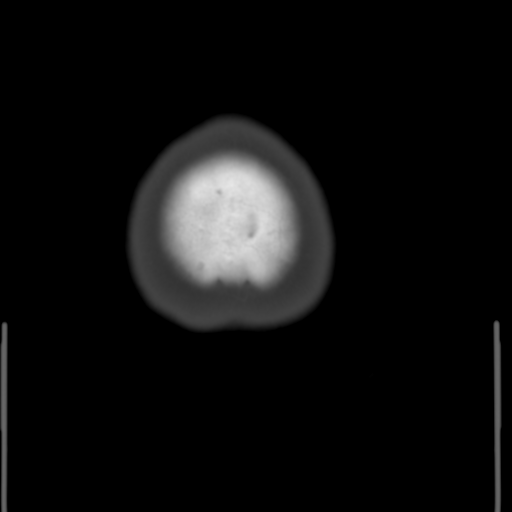

[Series 3: bone windows · axial · 0.45mm/px · z∈[+1121,+1161]mm · 3 of 29 slices shown]
[im 3/29  bone]
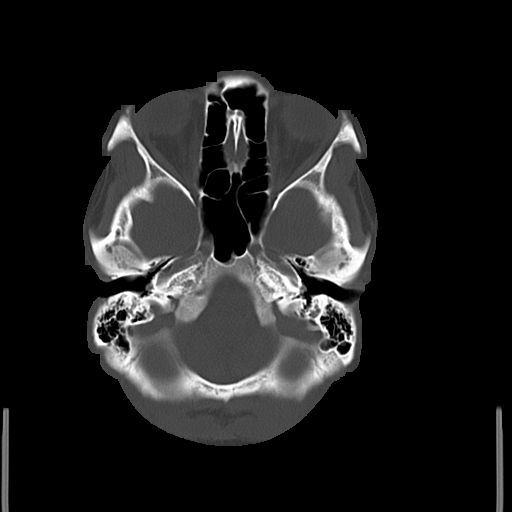
[im 7/29  bone]
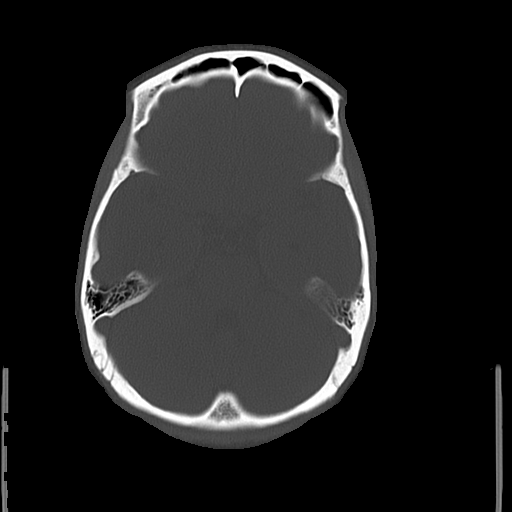
[im 11/29  bone]
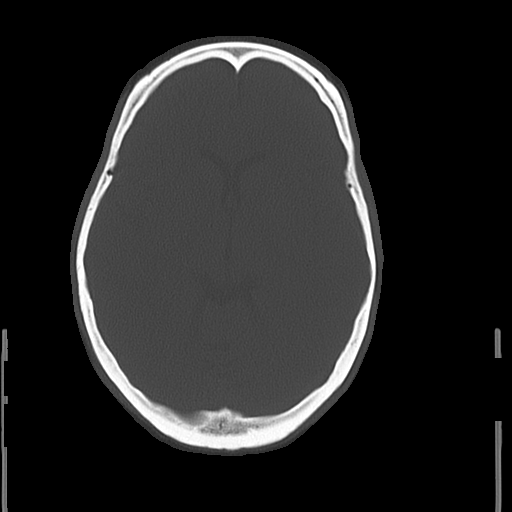

[16 of 30 positions shown; findings below may reference images not displayed]

FINDINGS: The brain has a normal appearance without evidence for
hemorrhage, acute infarction, hydrocephalus, or mass lesion.  There
is no extra axial fluid collection.  The skull and paranasal
sinuses are normal.
IMPRESSION: Normal CT of the head without contrast.

## 2014-08-22 ENCOUNTER — Emergency Department (HOSPITAL_COMMUNITY)
Admission: EM | Admit: 2014-08-22 | Discharge: 2014-08-22 | Disposition: A | Discharge status: Home / Self Care | Payer: BLUE CROSS/BLUE SHIELD | Attending: Emergency Medicine | Admitting: Emergency Medicine

## 2014-08-22 ENCOUNTER — Encounter (HOSPITAL_COMMUNITY): Payer: Self-pay | Admitting: Emergency Medicine

## 2014-08-22 DIAGNOSIS — G43909 Migraine, unspecified, not intractable, without status migrainosus: Secondary | ICD-10-CM | POA: Diagnosis present

## 2014-08-22 DIAGNOSIS — Z79899 Other long term (current) drug therapy: Secondary | ICD-10-CM | POA: Diagnosis not present

## 2014-08-22 DIAGNOSIS — Z7951 Long term (current) use of inhaled steroids: Secondary | ICD-10-CM | POA: Insufficient documentation

## 2014-08-22 DIAGNOSIS — Z862 Personal history of diseases of the blood and blood-forming organs and certain disorders involving the immune mechanism: Secondary | ICD-10-CM | POA: Insufficient documentation

## 2014-08-22 DIAGNOSIS — G43009 Migraine without aura, not intractable, without status migrainosus: Secondary | ICD-10-CM

## 2014-08-22 HISTORY — DX: Migraine, unspecified, not intractable, without status migrainosus: G43.909

## 2014-08-22 MED ORDER — RIZATRIPTAN BENZOATE 10 MG PO TABS: 10.0000 mg | ORAL_TABLET | ORAL | Status: DC | PRN

## 2014-08-22 MED ORDER — METOCLOPRAMIDE HCL 5 MG/ML IJ SOLN
10.0000 mg | Freq: Once | INTRAMUSCULAR | Status: AC
  Administered 2014-08-22: 10 mg via INTRAVENOUS
  Filled 2014-08-22: qty 2

## 2014-08-22 MED ORDER — DIPHENHYDRAMINE HCL 50 MG/ML IJ SOLN
25.0000 mg | Freq: Once | INTRAMUSCULAR | Status: AC
  Administered 2014-08-22: 25 mg via INTRAVENOUS
  Filled 2014-08-22: qty 1

## 2014-08-22 MED ORDER — SODIUM CHLORIDE 0.9 % IV BOLUS (SEPSIS)
1000.0000 mL | Freq: Once | INTRAVENOUS | Status: AC
  Administered 2014-08-22: 1000 mL via INTRAVENOUS

## 2014-08-22 NOTE — Discharge Instructions (Signed)
It was our pleasure to provide your ER care today - we hope that you feel better.  Rest. Drink plenty of fluids.   You may try maxalt as need for migraine headache.  Take a maximum of two tablets per 24 hour period.  Follow up with primary care doctor in coming week if symptoms fail to improve/resolve.  Return to ER if worse, new symptoms, persistent vomiting, severe pain, fevers, other concern.   You were given sedating medication in the ER - no driving for the next 4 hours.         Migraine Headache A migraine headache is an intense, throbbing pain on one or both sides of your head. A migraine can last for 30 minutes to several hours. CAUSES  The exact cause of a migraine headache is not always known. However, a migraine may be caused when nerves in the brain become irritated and release chemicals that cause inflammation. This causes pain. Certain things may also trigger migraines, such as:  Alcohol.  Smoking.  Stress.  Menstruation.  Aged cheeses.  Foods or drinks that contain nitrates, glutamate, aspartame, or tyramine.  Lack of sleep.  Chocolate.  Caffeine.  Hunger.  Physical exertion.  Fatigue.  Medicines used to treat chest pain (nitroglycerine), birth control pills, estrogen, and some blood pressure medicines. SIGNS AND SYMPTOMS  Pain on one or both sides of your head.  Pulsating or throbbing pain.  Severe pain that prevents daily activities.  Pain that is aggravated by any physical activity.  Nausea, vomiting, or both.  Dizziness.  Pain with exposure to bright lights, loud noises, or activity.  General sensitivity to bright lights, loud noises, or smells. Before you get a migraine, you may get warning signs that a migraine is coming (aura). An aura may include:  Seeing flashing lights.  Seeing bright spots, halos, or zigzag lines.  Having tunnel vision or blurred vision.  Having feelings of numbness or tingling.  Having trouble  talking.  Having muscle weakness. DIAGNOSIS  A migraine headache is often diagnosed based on:  Symptoms.  Physical exam.  A CT scan or MRI of your head. These imaging tests cannot diagnose migraines, but they can help rule out other causes of headaches. TREATMENT Medicines may be given for pain and nausea. Medicines can also be given to help prevent recurrent migraines.  HOME CARE INSTRUCTIONS  Only take over-the-counter or prescription medicines for pain or discomfort as directed by your health care provider. The use of long-term narcotics is not recommended.  Lie down in a dark, quiet room when you have a migraine.  Keep a journal to find out what may trigger your migraine headaches. For example, write down:  What you eat and drink.  How much sleep you get.  Any change to your diet or medicines.  Limit alcohol consumption.  Quit smoking if you smoke.  Get 7-9 hours of sleep, or as recommended by your health care provider.  Limit stress.  Keep lights dim if bright lights bother you and make your migraines worse. SEEK IMMEDIATE MEDICAL CARE IF:   Your migraine becomes severe.  You have a fever.  You have a stiff neck.  You have vision loss.  You have muscular weakness or loss of muscle control.  You start losing your balance or have trouble walking.  You feel faint or pass out.  You have severe symptoms that are different from your first symptoms. MAKE SURE YOU:   Understand these instructions.  Will watch  your condition.  Will get help right away if you are not doing well or get worse. Document Released: 06/19/2005 Document Revised: 11/03/2013 Document Reviewed: 02/24/2013 [facility] Patient Information 2015 [place], [place]. This information is not intended to replace advice given to you by your health care provider. Make sure you discuss any questions you have with your health care provider.

## 2014-08-22 NOTE — ED Provider Notes (Signed)
CSN: [id number]     Arrival date & time 08/22/14  1020 History   First MD Initiated Contact with Patient 08/22/14 1034     Chief Complaint  Patient presents with  . Migraine     (Consider location/radiation/quality/duration/timing/severity/associated sxs/prior Treatment) Patient is a 24 y.o. female presenting with migraines. The history is provided by the patient.  Migraine Associated symptoms include headaches. Pertinent negatives include no chest pain, no abdominal pain and no shortness of breath.  pt with hx migraine headaches, c/o throbbing, dull, moderate, right frontal headache for the past week. Gradual in onset, and milder at onset. Constant. No acute or abrupt worsening today. +photophobia. +nausea. No vomiting. Denies any recent head injury, trauma or fall. No syncope. Denies neck pain or stiffness. No eye pain or change in vision. No sinus drainage or congestion. No fever or chills. No numbness/weakness. No change in gait, balance or normal functional ability. Tried her topamax at home without relief. Feels like prior migraines.     Past Medical History  Diagnosis Date  . Anemia   . Migraine    Past Surgical History  Procedure Laterality Date  . Tonsillectomy     Family History  Problem Relation Age of Onset  . Diabetes      grandparent  . Breast cancer      grandmother   History  Substance Use Topics  . Smoking status: Never Smoker   . Smokeless tobacco: Not on file  . Alcohol Use: No   OB History    No data available     Review of Systems  Constitutional: Negative for fever and chills.  HENT: Negative for sinus pressure.   Eyes: Negative for pain and visual disturbance.  Respiratory: Negative for shortness of breath.   Cardiovascular: Negative for chest pain.  Gastrointestinal: Positive for nausea. Negative for vomiting and abdominal pain.  Genitourinary: Negative for flank pain.  Musculoskeletal: Negative for back pain, neck pain and neck stiffness.   Skin: Negative for rash.  Neurological: Positive for headaches. Negative for syncope, weakness and numbness.  Hematological: Does not bruise/bleed easily.  Psychiatric/Behavioral: Negative for confusion.      Allergies  Sulfonamide derivatives  Home Medications   Prior to Admission medications   Medication Sig Start Date End Date Taking? Authorizing Provider  cetirizine (ZYRTEC) 10 MG tablet Take 1 tablet (10 mg total) by mouth daily. 06/15/14   Sheliah Hatch, MD  fluticasone (FLONASE) 50 MCG/ACT nasal spray Place 2 sprays into both nostrils daily. 11/13/13   Kathrynn Speed, PA-C  NORA-BE 0.35 MG tablet  05/31/14   Historical Provider, MD  predniSONE (DELTASONE) 10 MG tablet 3 tabs x3 days and then 2 tabs x3 days and then 1 tab x3 days.  Take w/ food. 06/15/14   Sheliah Hatch, MD  topiramate (TOPAMAX) 25 MG tablet Take 25 mg by mouth as needed (migraines).     Historical Provider, MD   BP 132/75 mmHg  Pulse 80  Temp(Src) 98.5 F (36.9 C) (Oral)  Resp 17  SpO2 100%  LMP 08/20/2014 Physical Exam  Constitutional: She is oriented to person, place, and time. She appears well-developed and well-nourished. No distress.  HENT:  Head: Atraumatic.  Nose: Nose normal.  Mouth/Throat: Oropharynx is clear and moist.  No sinus or temporal tenderness.  Eyes: Conjunctivae and EOM are normal. Pupils are equal, round, and reactive to light. No scleral icterus.  Neck: Neck supple. No tracheal deviation present. No thyromegaly present.  No stiffness  or rigidity.   Cardiovascular: Normal rate, regular rhythm, normal heart sounds and intact distal pulses.  Exam reveals no gallop and no friction rub.   No murmur heard. Pulmonary/Chest: Effort normal and breath sounds normal. No respiratory distress.  Abdominal: Soft. Normal appearance and bowel sounds are normal. She exhibits no distension. There is no tenderness.  Genitourinary:  No cva tenderness.  Musculoskeletal: Normal range of  motion. She exhibits no edema or tenderness.  Neurological: She is alert and oriented to person, place, and time. No cranial nerve deficit.  Motor intact bilaterally. stre 5/5, sens grossly intact. Steady gait.   Skin: Skin is warm and dry. No rash noted. She is not diaphoretic.  Psychiatric: She has a normal mood and affect.  Nursing note and vitals reviewed.   ED Course  Procedures (including critical care time) Labs Review    MDM   Iv ns bolus. reglan iv. Benadryl iv.  Reviewed nursing notes and prior charts for additional history.   Recheck pt - headache resolved.   No nv. Afeb.  Pt has family here/does not have to drive home.  Return precautions provided.     Suzi RootsKevin E Audrianna Driskill, MD 08/22/14 [phone]

## 2014-08-22 NOTE — ED Notes (Signed)
Pt c/o migraine x week. Pt states nausea but denies vomiting.  Pt states she is sensitive to light but no visual disturbances. Pt has been taking her Topamax

## 2014-12-08 ENCOUNTER — Ambulatory Visit (INDEPENDENT_AMBULATORY_CARE_PROVIDER_SITE_OTHER): Payer: BLUE CROSS/BLUE SHIELD | Admitting: Sports Medicine

## 2014-12-08 VITALS — BP 130/82 | HR 88 | Temp 98.8°F | Resp 17 | Ht 68.0 in | Wt 157.0 lb

## 2014-12-08 DIAGNOSIS — J3089 Other allergic rhinitis: Secondary | ICD-10-CM

## 2014-12-08 DIAGNOSIS — J040 Acute laryngitis: Secondary | ICD-10-CM

## 2014-12-08 MED ORDER — METHYLPREDNISOLONE SODIUM SUCC 125 MG IJ SOLR
125.0000 mg | Freq: Once | INTRAMUSCULAR | Status: AC
  Administered 2014-12-08: 125 mg via INTRAMUSCULAR

## 2014-12-08 MED ORDER — HYDROCODONE-HOMATROPINE 5-1.5 MG/5ML PO SYRP: 5.0000 mL | ORAL_SOLUTION | Freq: Three times a day (TID) | ORAL | Status: DC | PRN

## 2014-12-08 MED ORDER — METHYLPREDNISOLONE SODIUM SUCC 125 MG IJ SOLR: 125.0000 mg | Freq: Once | INTRAMUSCULAR | Status: DC

## 2014-12-08 MED ORDER — FLUTICASONE PROPIONATE 50 MCG/ACT NA SUSP: 2.0000 | Freq: Every day | NASAL | Status: DC

## 2014-12-08 NOTE — Patient Instructions (Signed)
Re-start flonase, refilled today. Solu-Medrol 125mg  IM given today for laryngitis and congestive symptoms. No lower airway spasm appreciated. Take cough syrup at night if needed, keep in a safe place and do not let any others take your medication Follow-up with your primary care physician as previously scheduled or sooner you may return to our clinic if your symptoms acutely worsen.  Laryngitis At the top of your windpipe is your voice box. It is the source of your voice. Inside your voice box are 2 bands of muscles called vocal cords. When you breathe, your vocal cords are relaxed and open so that air can get into the lungs. When you decide to say something, these cords come together and vibrate. The sound from these vibrations goes into your throat and comes out through your mouth as sound. Laryngitis is an inflammation of the vocal cords that causes hoarseness, cough, loss of voice, sore throat, and dry throat. Laryngitis can be temporary (acute) or long-term (chronic). Most cases of acute laryngitis improve with time.Chronic laryngitis lasts for more than 3 weeks. CAUSES Laryngitis can often be related to excessive smoking, talking, or yelling, as well as inhalation of toxic fumes and allergies. Acute laryngitis is usually caused by a viral infection, vocal strain, measles or mumps, or bacterial infections. Chronic laryngitis is usually caused by vocal cord strain, vocal cord injury, postnasal drip, growths on the vocal cords, or acid reflux. SYMPTOMS   Cough.  Sore throat.  Dry throat. RISK FACTORS  Respiratory infections.  Exposure to irritating substances, such as cigarette smoke, excessive amounts of alcohol, stomach acids, and workplace chemicals.  Voice trauma, such as vocal cord injury from shouting or speaking too loud. DIAGNOSIS  Your cargiver will perform a physical exam. During the physical exam, your caregiver will examine your throat. The most common sign of laryngitis is  hoarseness. Laryngoscopy may be necessary to confirm the diagnosis of this condition. This procedure allows your caregiver to look into the larynx. HOME CARE INSTRUCTIONS  Drink enough fluids to keep your urine clear or pale yellow.  Rest until you no longer have symptoms or as directed by your caregiver.  Breathe in moist air.  Take all medicine as directed by your caregiver.  Do not smoke.  Talk as little as possible (this includes whispering).  Write on paper instead of talking until your voice is back to normal.  Follow up with your caregiver if your condition has not improved after 10 days. SEEK MEDICAL CARE IF:   You have trouble breathing.  You cough up blood.  You have persistent fever.  You have increasing pain.  You have difficulty swallowing. MAKE SURE YOU:  Understand these instructions.  Will watch your condition.  Will get help right away if you are not doing well or get worse. Document Released: 06/19/2005 Document Revised: 09/11/2011 Document Reviewed: 08/25/2010 [facility] Patient Information 2015 [place], [place]. This information is not intended to replace advice given to you by your health care provider. Make sure you discuss any questions you have with your health care provider.

## 2014-12-08 NOTE — Progress Notes (Signed)
   Subjective:    Patient ID: Baldomero LamyKeely Crilly, female    DOB: 09/23/1990, 24 y.o.   MRN: [id number]  HPI Ms. Cliffton AstersWhite is a 24 year-old female with a PMHx of allergic rhinitis who presents with sore throat, cough, and shortness of breath. She notes significant hoarseness of her voice. Onset was about 4 days ago. She noticed cough and sore throat initially. She tried zyrtec without relief. She denies. odynophagia or dysphagia. No significant neck pain or tenderness. PSx history of tonsillectomy. She denies significant wheezing. She notes associated sinus congestion, clear, serous drainage. Two days ago, she noticed some chills but no fevers. Cough is dry, and wakes her up at night. She used to take flonase for her nasal allergic symptoms, but she discontinued this before she started having symptoms. She requests a refill of the flonase.  She was last on a steroid burst December 2015, felt mainly be due to allergic rhinitis without infectious etiology.  No known sick contacts. No recent travel.  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  Past medical history, social history, medications, and allergies were reviewed and are up to date in the chart.  Review of Systems 7 point review of systems was performed and was otherwise negative unless noted in the history of present illness.     Objective:   Physical Exam BP 130/82 mmHg  Pulse 88  Temp(Src) 98.8 F (37.1 C) (Oral)  Resp 17  Ht 5\' 8"  (1.727 m)  Wt 157 lb (71.215 kg)  BMI 23.88 kg/m2  SpO2 98%  LMP 11/24/2014 General appearance: alert, cooperative and no distress Head: Normocephalic, without obvious abnormality, atraumatic Eyes: dry conjunctiva, no exudates Ears: normal TM's and external ear canals both ears Nose: clear discharge, moderate congestion, turbinates red, swollen Throat: Postnasal drip. No uvular deviation. No peritonsillar enlargement, voice with hoarseness Neck: mild anterior cervical  adenopathy, no carotid bruit, no JVD, supple, symmetrical, trachea midline and thyroid not enlarged, symmetric, no tenderness/mass/nodules Lungs: clear to auscultation bilaterally, no inspiratory or expiratory wheezing or stridor. No rales or rhonchi. No accessory muscle use or tachypnea. Heart: regular rate and rhythm, S1, S2 normal, no murmur, click, rub or gallop Abdomen: soft, non-tender; bowel sounds normal; no masses,  no organomegaly Skin: Skin color, texture, turgor normal. No rashes or lesions Lymph nodes: Cervical adenopathy: mild     Assessment & Plan:  1. Laryngitis, etiology possibly allergic vs. Viral. Unlikely bacterial 2. Cough  -Solu-Medrol 125mg  IM administered here -Refilled flonase with instructions to re-start -Rx hycodan cough syrup with precautions on use -Discussed supportive cares, warm humidified air, PO hydration. Tylenol and ibuprofen as needed for fever or body aches.  -If noticing increasing cough, fever >102, shortness of breath, wheezing, lethargy, or for any other concerns, then return to the clinic or go to the emergency department. Patient verbalized understanding and agreement.  Dr. Joellyn HaffPick-Jacobs, DO Sports Medicine Fellow

## 2014-12-08 NOTE — Addendum Note (Signed)
Addended by: Mila MerryHARRELL, Brae Schaafsma on: 12/08/2014 08:18 PM   Modules accepted: Orders, Medications

## 2015-06-13 ENCOUNTER — Encounter (HOSPITAL_COMMUNITY): Payer: Self-pay | Admitting: *Deleted

## 2015-06-13 ENCOUNTER — Inpatient Hospital Stay (HOSPITAL_COMMUNITY): Payer: BLUE CROSS/BLUE SHIELD

## 2015-06-13 ENCOUNTER — Inpatient Hospital Stay (HOSPITAL_COMMUNITY)
Admission: AD | Admit: 2015-06-13 | Discharge: 2015-06-13 | Disposition: A | Discharge status: Home / Self Care | Payer: BLUE CROSS/BLUE SHIELD | Source: Ambulatory Visit | Attending: Obstetrics | Admitting: Obstetrics

## 2015-06-13 DIAGNOSIS — Z3A01 Less than 8 weeks gestation of pregnancy: Secondary | ICD-10-CM | POA: Insufficient documentation

## 2015-06-13 DIAGNOSIS — O4691 Antepartum hemorrhage, unspecified, first trimester: Secondary | ICD-10-CM | POA: Diagnosis not present

## 2015-06-13 DIAGNOSIS — O209 Hemorrhage in early pregnancy, unspecified: Secondary | ICD-10-CM | POA: Diagnosis not present

## 2015-06-13 DIAGNOSIS — O26851 Spotting complicating pregnancy, first trimester: Secondary | ICD-10-CM | POA: Diagnosis present

## 2015-06-13 LAB — URINALYSIS, ROUTINE W REFLEX MICROSCOPIC
Bilirubin Urine: NEGATIVE
GLUCOSE, UA: NEGATIVE mg/dL
Ketones, ur: NEGATIVE mg/dL
LEUKOCYTES UA: NEGATIVE
Nitrite: NEGATIVE
PH: 6 (ref 5.0–8.0)
PROTEIN: NEGATIVE mg/dL
SPECIFIC GRAVITY, URINE: 1.015 (ref 1.005–1.030)

## 2015-06-13 LAB — CBC
HCT: 34.1 % — ABNORMAL LOW (ref 36.0–46.0)
Hemoglobin: 11.4 g/dL — ABNORMAL LOW (ref 12.0–15.0)
MCH: 27.3 pg (ref 26.0–34.0)
MCHC: 33.4 g/dL (ref 30.0–36.0)
MCV: 81.6 fL (ref 78.0–100.0)
Platelets: 240 10*3/uL (ref 150–400)
RBC: 4.18 MIL/uL (ref 3.87–5.11)
RDW: 12.8 % (ref 11.5–15.5)
WBC: 5.2 10*3/uL (ref 4.0–10.5)

## 2015-06-13 LAB — ABO/RH: ABO/RH(D): B POS

## 2015-06-13 LAB — URINE MICROSCOPIC-ADD ON: BACTERIA UA: NONE SEEN

## 2015-06-13 LAB — POCT PREGNANCY, URINE: Preg Test, Ur: POSITIVE — AB

## 2015-06-13 LAB — WET PREP, GENITAL
SPERM: NONE SEEN
TRICH WET PREP: NONE SEEN
Yeast Wet Prep HPF POC: NONE SEEN

## 2015-06-13 LAB — HCG, QUANTITATIVE, PREGNANCY: HCG, BETA CHAIN, QUANT, S: 29402 m[IU]/mL — AB (ref <5)

## 2015-06-13 NOTE — MAU Note (Signed)
Pt reports some spotting today. + home pregnancy test.

## 2015-06-13 NOTE — MAU Note (Signed)
Pos HPT on Thursday - two tests.  Started spotting today, denies pain.  C/O nausea.

## 2015-06-13 NOTE — MAU Provider Note (Signed)
History     CSN: [id number]  Arrival date and time: 06/13/15 1615  Seen by provider at 1640     Chief Complaint  Patient presents with  . Vaginal Bleeding   HPI Connie Cobb 24 y.o. Had a positive home pregnancy test last week.  Noticed spotting today when wiping.  Denies any abdominal pain.  Has not yet started prenatal care with this pregnancy.   OB History    Gravida Para Term Preterm AB TAB SAB Ectopic Multiple Living   2    1           Past Medical History  Diagnosis Date  . Anemia   . Migraine   . Allergy   . GERD (gastroesophageal reflux disease)     Past Surgical History  Procedure Laterality Date  . Tonsillectomy      Family History  Problem Relation Age of Onset  . Diabetes      grandparent  . Breast cancer      grandmother  . Diabetes Father   . Hyperlipidemia Father   . Hypertension Father   . Hypertension Maternal Grandmother   . Cancer Maternal Grandfather   . Heart disease Maternal Grandfather   . Hypertension Maternal Grandfather   . Cancer Paternal Grandmother     Social History  Substance Use Topics  . Smoking status: Never Smoker   . Smokeless tobacco: None  . Alcohol Use: No    Allergies:  Allergies  Allergen Reactions  . Sulfonamide Derivatives Hives    Prescriptions prior to admission  Medication Sig Dispense Refill Last Dose  . ibuprofen (ADVIL,MOTRIN) 200 MG tablet Take 800 mg by mouth every 6 (six) hours as needed for fever, headache, moderate pain or cramping.   Past Week at Unknown time  . cetirizine (ZYRTEC) 10 MG tablet Take 1 tablet (10 mg total) by mouth daily. 30 tablet 11 Taking  . fluticasone (FLONASE) 50 MCG/ACT nasal spray Place 2 sprays into both nostrils daily. 16 g 3   . HYDROcodone-homatropine (HYCODAN) 5-1.5 MG/5ML syrup Take 5 mLs by mouth every 8 (eight) hours as needed for cough. 120 mL 0   . topiramate (TOPAMAX) 25 MG tablet Take 25 mg by mouth as needed (migraines).    Taking    Review of Systems   Constitutional: Negative for fever.  Gastrointestinal: Negative for nausea, vomiting and abdominal pain.  Genitourinary:       No vaginal discharge. Vaginal bleeding. No dysuria.   Physical Exam   Blood pressure 132/68, pulse 102, temperature 98.2 F (36.8 C), temperature source Oral, resp. rate 16, last menstrual period 05/08/2015.  Physical Exam  Nursing note and vitals reviewed. Constitutional: She is oriented to person, place, and time. She appears well-developed and well-nourished.  HENT:  Head: Normocephalic.  Eyes: EOM are normal.  Neck: Neck supple.  GI: Soft. There is no tenderness.  Genitourinary:  Speculum exam: Vagina -Minimal creamy discharge, no odor, traces of blood seen on vaginal walls, swab is pink in color when withdrawn Cervix - No contact bleeding Bimanual exam: Cervix closed Uterus non tender, 6 week size Adnexa non tender, no masses bilaterally GC/Chlam, wet prep done Chaperone present for exam.  Musculoskeletal: Normal range of motion.  Neurological: She is alert and oriented to person, place, and time.  Skin: Skin is warm and dry.  Psychiatric: She has a normal mood and affect.    MAU Course  Procedures Results for orders placed or performed during the hospital encounter of  06/13/15 (from the past 24 hour(s))  Urinalysis, Routine w reflex microscopic (not at [facility])     Status: Abnormal   Collection Time: 06/13/15  4:30 PM  Result Value Ref Range   Color, Urine YELLOW YELLOW   APPearance CLEAR CLEAR   Specific Gravity, Urine 1.015 1.005 - 1.030   pH 6.0 5.0 - 8.0   Glucose, UA NEGATIVE NEGATIVE mg/dL   Hgb urine dipstick MODERATE (A) NEGATIVE   Bilirubin Urine NEGATIVE NEGATIVE   Ketones, ur NEGATIVE NEGATIVE mg/dL   Protein, ur NEGATIVE NEGATIVE mg/dL   Nitrite NEGATIVE NEGATIVE   Leukocytes, UA NEGATIVE NEGATIVE  Urine microscopic-add on     Status: Abnormal   Collection Time: 06/13/15  4:30 PM  Result Value Ref Range   Squamous  Epithelial / LPF 0-5 (A) NONE SEEN   WBC, UA 0-5 0 - 5 WBC/hpf   RBC / HPF 0-5 0 - 5 RBC/hpf   Bacteria, UA NONE SEEN NONE SEEN   Urine-Other MUCOUS PRESENT   Pregnancy, urine POC     Status: Abnormal   Collection Time: 06/13/15  4:34 PM  Result Value Ref Range   Preg Test, Ur POSITIVE (A) NEGATIVE  Wet prep, genital     Status: Abnormal   Collection Time: 06/13/15  4:45 PM  Result Value Ref Range   Yeast Wet Prep HPF POC NONE SEEN NONE SEEN   Trich, Wet Prep NONE SEEN NONE SEEN   Clue Cells Wet Prep HPF POC PRESENT (A) NONE SEEN   WBC, Wet Prep HPF POC MANY (A) NONE SEEN   Sperm NONE SEEN   CBC     Status: Abnormal   Collection Time: 06/13/15  4:55 PM  Result Value Ref Range   WBC 5.2 4.0 - 10.5 K/uL   RBC 4.18 3.87 - 5.11 MIL/uL   Hemoglobin 11.4 (L) 12.0 - 15.0 g/dL   HCT 01.0 (L) 27.2 - 53.6 %   MCV 81.6 78.0 - 100.0 fL   MCH 27.3 26.0 - 34.0 pg   MCHC 33.4 30.0 - 36.0 g/dL   RDW 64.4 03.4 - 74.2 %   Platelets 240 150 - 400 K/uL  ABO/Rh     Status: None (Preliminary result)   Collection Time: 06/13/15  4:55 PM  Result Value Ref Range   ABO/RH(D) B POS     MDM CLINICAL DATA: First trimester of pregnancy, vaginal bleeding.  EXAM: OBSTETRIC <14 WK [place] AND TRANSVAGINAL OB US  TECHNIQUE: Both transabdominal and transvaginal ultrasound examinations were performed for complete evaluation of the gestation as well as the maternal uterus, adnexal regions, and pelvic cul-de-sac. Transvaginal technique was performed to assess early pregnancy.  COMPARISON: None.  FINDINGS: Intrauterine gestational sac: Visualized/normal in shape.  Yolk sac: Visualized.  Embryo: Visualized.  Cardiac Activity: Visualized.  Heart Rate: 102 bpm  CRL: 2.2 mm 5 w 5 d [place] EDC: February 08, 2016.  Maternal uterus/adnexae: Ovaries appear normal. No free fluid is noted.  IMPRESSION: Single live intrauterine gestation of 5 weeks 5 days.   Assessment  and Plan  Viable IUP with bleeding of unknown cause in early pregnancy   Plan No smoking, no drugs, no alcohol.  Take a prenatal vitamin one by mouth every day.  Eat small frequent snacks to avoid nausea.  Begin prenatal care as soon as possible. No intercourse while having vaginal bleeding. Labs pending.    BURLESON,TERRI 06/13/2015, 4:46 PM

## 2015-06-13 NOTE — Discharge Instructions (Signed)
No smoking, no drugs, no alcohol.  Take a prenatal vitamin one by mouth every day.  Eat small frequent snacks to avoid nausea.  Begin prenatal care as soon as possible. No intercourse while having vaginal bleeding. Check with your doctor about your medications.

## 2015-06-14 LAB — HIV ANTIBODY (ROUTINE TESTING W REFLEX): HIV SCREEN 4TH GENERATION: NONREACTIVE

## 2015-06-14 LAB — RPR: RPR Ser Ql: NONREACTIVE

## 2015-06-14 LAB — GC/CHLAMYDIA PROBE AMP (~~LOC~~) NOT AT ARMC
Chlamydia: NEGATIVE
Neisseria Gonorrhea: NEGATIVE

## 2015-07-04 NOTE — L&D Delivery Note (Signed)
Operative Delivery Note At 10:45 AM a viable female was delivered via Vaginal, Spontaneous Delivery.  Presentation: vertex; Position: Left,, Occiput,, Anterior.  Delivery of the head: 10:44   First maneuver: McRoberts Second maneuver: Suprapubic pressure Third maneuver: NA  APGAR: 2, 9; weight 8 lb 4.5 oz (3755 g).   Placenta status: delivered spontaneously in tact Cord:  with the following complications: .  Cord pH: 7.28  Anesthesia: Fentanyl Episiotomy: None Lacerations: None Suture Repair:NA Est. Blood Loss (mL): 150  Mom to postpartum.  Baby to Couplet care / Skin to Skin.  All instruments and gauze accounted for and sharps disposed of.  Connie Cobb is a 25 y.o. female G2P1011 with IUP at [redacted]w[redacted]d admitted for IOL for GHTN and GDM .  She progressed with augmentation to complete and pushed less than 1 hour to deliver.  Shoulder dystocia x1.5 minutes resolved with McRoberts and suprapubic pressure.  Cord clamping done immediately by CNM for slow transition and infant taken to warmer for evaluation by nursery/NICU team who were called to room. Placenta intact and spontaneous, bleeding minimal.  Intact perineum.  Infant began to transition well after resuscitative efforts and remained in room with patient.  Mom and baby stable prior to transfer to postpartum. She plans on breastfeeding.    Andres Ege, MD, PGY-1, MPH 01/27/2016, 8:04 PM  Midwife attestation: I was gloved and present for delivery in its entirety and I agree with the above resident's note.  Connie Cobb, Connie Cobb, CNM 9:56 PM

## 2015-08-04 LAB — CYTOLOGY - PAP: PAP SMEAR: NEGATIVE

## 2015-08-05 LAB — OB RESULTS CONSOLE ABO/RH: RH Type: POSITIVE

## 2015-08-05 LAB — SICKLE CELL SCREEN: Sickle Cell Screen: NEGATIVE

## 2015-08-05 LAB — OB RESULTS CONSOLE HEPATITIS B SURFACE ANTIGEN: HEP B S AG: NEGATIVE

## 2015-08-05 LAB — OB RESULTS CONSOLE GC/CHLAMYDIA
Chlamydia: NEGATIVE
GC PROBE AMP, GENITAL: NEGATIVE

## 2015-08-05 LAB — OB RESULTS CONSOLE RUBELLA ANTIBODY, IGM: RUBELLA: IMMUNE

## 2015-08-05 LAB — OB RESULTS CONSOLE HGB/HCT, BLOOD
HCT: 34 %
HEMOGLOBIN: 11.1 g/dL

## 2015-08-05 LAB — OB RESULTS CONSOLE RPR: RPR: NONREACTIVE

## 2015-08-05 LAB — OB RESULTS CONSOLE PLATELET COUNT: Platelets: 235 10*3/uL

## 2015-11-02 LAB — OB RESULTS CONSOLE RPR: RPR: REACTIVE

## 2016-01-05 ENCOUNTER — Encounter: Payer: BLUE CROSS/BLUE SHIELD | Admitting: Obstetrics

## 2016-01-06 ENCOUNTER — Ambulatory Visit (INDEPENDENT_AMBULATORY_CARE_PROVIDER_SITE_OTHER): Payer: 59 | Admitting: Obstetrics

## 2016-01-06 ENCOUNTER — Encounter: Payer: Self-pay | Admitting: Obstetrics

## 2016-01-06 VITALS — BP 131/87 | HR 115 | Temp 98.2°F | Wt 200.0 lb

## 2016-01-06 DIAGNOSIS — Z3483 Encounter for supervision of other normal pregnancy, third trimester: Secondary | ICD-10-CM

## 2016-01-06 DIAGNOSIS — R110 Nausea: Secondary | ICD-10-CM | POA: Diagnosis not present

## 2016-01-06 LAB — POCT URINALYSIS DIPSTICK
Bilirubin, UA: NEGATIVE
Blood, UA: 250
GLUCOSE UA: NEGATIVE
KETONES UA: NEGATIVE
Leukocytes, UA: NEGATIVE
Nitrite, UA: NEGATIVE
SPEC GRAV UA: 1.01
Urobilinogen, UA: NEGATIVE
pH, UA: 7

## 2016-01-08 LAB — CULTURE, OB URINE

## 2016-01-08 LAB — URINE CULTURE, OB REFLEX

## 2016-01-09 ENCOUNTER — Encounter: Payer: Self-pay | Admitting: *Deleted

## 2016-01-09 LAB — STREP GP B NAA: Strep Gp B NAA: NEGATIVE

## 2016-01-11 ENCOUNTER — Ambulatory Visit (INDEPENDENT_AMBULATORY_CARE_PROVIDER_SITE_OTHER): Payer: 59 | Admitting: Obstetrics

## 2016-01-11 ENCOUNTER — Encounter: Payer: Self-pay | Admitting: Obstetrics

## 2016-01-11 VITALS — BP 138/83 | HR 106 | Temp 98.1°F | Wt 203.0 lb

## 2016-01-11 DIAGNOSIS — Z3403 Encounter for supervision of normal first pregnancy, third trimester: Secondary | ICD-10-CM

## 2016-01-11 DIAGNOSIS — Z1389 Encounter for screening for other disorder: Secondary | ICD-10-CM | POA: Diagnosis not present

## 2016-01-11 DIAGNOSIS — K219 Gastro-esophageal reflux disease without esophagitis: Secondary | ICD-10-CM

## 2016-01-11 DIAGNOSIS — Z331 Pregnant state, incidental: Secondary | ICD-10-CM

## 2016-01-11 LAB — POCT URINALYSIS DIPSTICK
Bilirubin, UA: NEGATIVE
GLUCOSE UA: NEGATIVE
KETONES UA: NEGATIVE
Leukocytes, UA: NEGATIVE
Nitrite, UA: NEGATIVE
Protein, UA: NEGATIVE
RBC UA: NEGATIVE
SPEC GRAV UA: 1.01
UROBILINOGEN UA: NEGATIVE
pH, UA: 7

## 2016-01-11 MED ORDER — OMEPRAZOLE 20 MG PO CPDR
20.0000 mg | DELAYED_RELEASE_CAPSULE | Freq: Two times a day (BID) | ORAL | Status: DC
Start: 2016-01-11 — End: 2016-01-29

## 2016-01-11 NOTE — Progress Notes (Signed)
Patient is feeling a lot of pressure when she is at work.

## 2016-01-12 ENCOUNTER — Encounter: Payer: Self-pay | Admitting: Obstetrics

## 2016-01-12 NOTE — Progress Notes (Signed)
Subjective:    Connie LamyKeely Brassell is a 25 y.o. female being seen today for her obstetrical visit. She is at 664w1d gestation. Patient reports no complaints. Fetal movement: normal.  Problem List Items Addressed This Visit    None    Visit Diagnoses    Encounter for supervision of other normal pregnancy in third trimester    -  Primary    Relevant Medications    ferrous sulfate 325 (65 FE) MG tablet    Prenat-Fe Carbonyl-FA-Omega 3 (ONE-A-DAY WOMENS PRENATAL 1 PO)    Other Relevant Orders    POCT urinalysis dipstick (Completed)    Culture, OB Urine (Completed)    Strep Gp B NAA (Completed)    Result (Completed)    Nausea        Relevant Medications    promethazine (PHENERGAN) 12.5 MG tablet      Patient Active Problem List   Diagnosis Date Noted  . Ringing in right ear 06/15/2014  . Inattention 01/23/2013  . Migraine, unspecified, without mention of intractable migraine without mention of status migrainosus 01/23/2013  . Chlamydia 12/22/2011  . GERD (gastroesophageal reflux disease) 10/29/2011  . Sinusitis 08/21/2011  . Allergic rhinitis, seasonal 08/21/2011  . UTI (lower urinary tract infection) 08/21/2011  . Screening for malignant neoplasm of the cervix 05/22/2011  . Routine gynecological examination 05/22/2011  . Family history of breast cancer 05/22/2011  . Contact with or exposure to other viral diseases(V01.79) 12/01/2010  . Anxiety 12/01/2010  . RINGWORM 08/01/2010  . VAGINITIS 08/01/2010  . ANEMIA-NOS 05/19/2010   Objective:    BP 131/87 mmHg  Pulse 115  Temp(Src) 98.2 F (36.8 C)  Wt 200 lb (90.719 kg)  LMP 05/08/2015 (Exact Date) FHT:  150 BPM  Uterine Size: size equals dates  Presentation: unsure     Assessment:    Pregnancy @ 1064w1d weeks   Plan:     labs reviewed, problem list updated Consent signed. GBS sent TDAP offered  Rhogam given for RH negative Pediatrician: discussed. Infant feeding: plans to breastfeed. Maternity leave:  discussed. Cigarette smoking: never smoked. Orders Placed This Encounter  Procedures  . Culture, OB Urine  . Strep Gp B NAA  . Result  . POCT urinalysis dipstick   Meds ordered this encounter  Medications  . ferrous sulfate 325 (65 FE) MG tablet    Sig: Take 325 mg by mouth daily with breakfast.  . Prenat-Fe Carbonyl-FA-Omega 3 (ONE-A-DAY WOMENS PRENATAL 1 PO)    Sig: Take by mouth.  . promethazine (PHENERGAN) 12.5 MG tablet    Sig: Take 12.5 mg by mouth every 6 (six) hours as needed for nausea or vomiting. Reported on 01/06/2016   Follow up in 1 Week.

## 2016-01-12 NOTE — Progress Notes (Signed)
Subjective:    Connie Cobb is a 25 y.o. female being seen today for her obstetrical visit. She is at 5950w1d gestation. Patient reports heartburn Fetal movement: normal.  Problem List Items Addressed This Visit    None    Visit Diagnoses    Encounter for supervision of normal first pregnancy in third trimester    -  Primary    Relevant Orders    POCT urinalysis dipstick (Completed)    GERD without esophagitis        Relevant Medications    omeprazole (PRILOSEC) 20 MG capsule      Patient Active Problem List   Diagnosis Date Noted  . Ringing in right ear 06/15/2014  . Inattention 01/23/2013  . Migraine, unspecified, without mention of intractable migraine without mention of status migrainosus 01/23/2013  . Chlamydia 12/22/2011  . GERD (gastroesophageal reflux disease) 10/29/2011  . Sinusitis 08/21/2011  . Allergic rhinitis, seasonal 08/21/2011  . UTI (lower urinary tract infection) 08/21/2011  . Screening for malignant neoplasm of the cervix 05/22/2011  . Routine gynecological examination 05/22/2011  . Family history of breast cancer 05/22/2011  . Contact with or exposure to other viral diseases(V01.79) 12/01/2010  . Anxiety 12/01/2010  . RINGWORM 08/01/2010  . VAGINITIS 08/01/2010  . ANEMIA-NOS 05/19/2010   Objective:    BP 138/83 mmHg  Pulse 106  Temp(Src) 98.1 F (36.7 C)  Wt 203 lb (92.08 kg)  LMP 05/08/2015 (Exact Date) FHT:  150 BPM  Uterine Size: size equals dates  Presentation: cephalic     Assessment:    Pregnancy @ 3450w1d weeks   Plan:     labs reviewed, problem list updated Consent signed. GBS sent TDAP offered  Rhogam given for RH negative Pediatrician: discussed. Infant feeding: plans to breastfeed. Maternity leave: discussed. Cigarette smoking: never smoked. Orders Placed This Encounter  Procedures  . POCT urinalysis dipstick   Meds ordered this encounter  Medications  . omeprazole (PRILOSEC) 20 MG capsule    Sig: Take 1 capsule (20 mg  total) by mouth 2 (two) times daily before a meal.    Dispense:  60 capsule    Refill:  5   Follow up in 1 Week.

## 2016-01-19 ENCOUNTER — Ambulatory Visit (INDEPENDENT_AMBULATORY_CARE_PROVIDER_SITE_OTHER): Payer: 59 | Admitting: Certified Nurse Midwife

## 2016-01-19 VITALS — BP 139/91 | HR 96 | Temp 97.6°F | Wt 204.8 lb

## 2016-01-19 DIAGNOSIS — Z1389 Encounter for screening for other disorder: Secondary | ICD-10-CM

## 2016-01-19 DIAGNOSIS — Z3403 Encounter for supervision of normal first pregnancy, third trimester: Secondary | ICD-10-CM

## 2016-01-19 DIAGNOSIS — Z331 Pregnant state, incidental: Secondary | ICD-10-CM | POA: Diagnosis not present

## 2016-01-19 DIAGNOSIS — Z3483 Encounter for supervision of other normal pregnancy, third trimester: Secondary | ICD-10-CM | POA: Insufficient documentation

## 2016-01-19 LAB — POCT URINALYSIS DIPSTICK
BILIRUBIN UA: NEGATIVE
Blood, UA: NEGATIVE
GLUCOSE UA: NEGATIVE
KETONES UA: NEGATIVE
LEUKOCYTES UA: NEGATIVE
Nitrite, UA: NEGATIVE
Protein, UA: NEGATIVE
Spec Grav, UA: 1.01
Urobilinogen, UA: NEGATIVE
pH, UA: 7

## 2016-01-19 NOTE — Progress Notes (Signed)
Subjective:    Connie Cobb is a 25 y.o. female being seen today for her obstetrical visit. She is at 2864w1d gestation. Patient reports backache, no bleeding, no cramping, no leaking and occasional contractions. Fetal movement: normal.  Problem List Items Addressed This Visit      Other   Encounter for supervision of other normal pregnancy in third trimester     Clinic  [facility] Prenatal Labs  Dating  US early Blood type: B/Positive/-- (02/02 0000)   Genetic Screen 1 Screen:    AFP:     Quad:     NIPS: Antibody:   Anatomic US  Rubella: Immune (02/02 0000)  GTT Early:               Third trimester:  RPR: Reactive (05/02 0000)   Flu vaccine  HBsAg: Negative (02/02 0000)   TDaP vaccine                                               Rhogam: HIV: Non Reactive (12/11 1655)   Baby Food              Breast                                 [id number]:Negative (07/06 1706)(For PCN allergy, check sensitivities)  Contraception  POP Pap:  [facility]  Transfer of Care from Dr. Gaynell FaceMarshall  [facility]ediatric[facility]   Support Person            Other Visit Diagnoses    Encounter for supervision of normal first pregnancy in third trimester    -  Primary    Relevant Orders    POCT urinalysis dipstick (Completed)    CBC    Lactate dehydrogenase    Creatinine, serum    ALT    AST    Protein / creatinine ratio, urine    Pathologist smear review      Patient Active Problem List   Diagnosis Date Noted  . Encounter for supervision of other normal pregnancy in third trimester 01/19/2016  . Ringing in right ear 06/15/2014  . Inattention 01/23/2013  . Migraine, unspecified, without mention of intractable migraine without mention of status migrainosus 01/23/2013  . Chlamydia 12/22/2011  . GERD (gastroesophageal reflux disease) 10/29/2011  . Sinusitis 08/21/2011  . Allergic rhinitis, seasonal 08/21/2011  . UTI (lower urinary tract infection) 08/21/2011  . Screening for malignant  neoplasm of the cervix 05/22/2011  . Routine gynecological examination 05/22/2011  . Family history of breast cancer 05/22/2011  . Contact with or exposure to other viral diseases(V01.79) 12/01/2010  . Anxiety 12/01/2010  . RINGWORM 08/01/2010  . VAGINITIS 08/01/2010  . ANEMIA-NOS 05/19/2010    Objective:    BP 139/91 mmHg  Pulse 96  Temp(Src) 97.6 F (36.4 C)  Wt 204 lb 12.8 oz (92.897 kg)  LMP 05/08/2015 (Exact Date) FHT: 135 BPM  Uterine Size: size equals dates  Presentations: cephalic  Pelvic Exam: deferred     Assessment:    Pregnancy @ 3964w1d weeks   elevated blood pressure today  Plan:   Plans for delivery: Vaginal anticipated; labs reviewed; problem list updated Counseling: Consent signed. Infant feeding: plans to breastfeed. Cigarette smoking: never smoked. L&D discussion: symptoms of labor, discussed when to  call, discussed what number to call, anesthetic/analgesic options reviewed and delivering clinician:  plans no preference. Postpartum supports and preparation: circumcision discussed and contraception plans discussed.  Follow up in 1 Week.

## 2016-01-19 NOTE — Assessment & Plan Note (Signed)
  Clinic  [facility] Prenatal Labs  Dating  US early Blood type: B/Positive/-- (02/02 0000)   Genetic Screen 1 Screen:    AFP:     Quad:     NIPS: Antibody:   Anatomic US  Rubella: Immune (02/02 0000)  GTT Early:               Third trimester:  RPR: Reactive (05/02 0000)   Flu vaccine  HBsAg: Negative (02/02 0000)   TDaP vaccine                                               Rhogam: HIV: Non Reactive (12/11 1655)   Baby Food              Breast                                 [id number]:Negative (07/06 1706)(For PCN allergy, check sensitivities)  Contraception  POP Pap:  [facility]  Transfer of Care from Dr. Gaynell FaceMarshall  [facility]   Support Person

## 2016-01-19 NOTE — Progress Notes (Signed)
Patient has had an increase in discharge. Thin discharge. Still having some pressure down low.

## 2016-01-20 LAB — LACTATE DEHYDROGENASE: LDH: 173 IU/L (ref 119–226)

## 2016-01-20 LAB — ALT: ALT: 24 IU/L (ref 0–32)

## 2016-01-20 LAB — CBC
HEMATOCRIT: 33.9 % — AB (ref 34.0–46.6)
Hemoglobin: 11.3 g/dL (ref 11.1–15.9)
MCH: 28 pg (ref 26.6–33.0)
MCHC: 33.3 g/dL (ref 31.5–35.7)
MCV: 84 fL (ref 79–97)
Platelets: 139 10*3/uL — ABNORMAL LOW (ref 150–379)
RBC: 4.04 x10E6/uL (ref 3.77–5.28)
RDW: 14.3 % (ref 12.3–15.4)
WBC: 7.8 10*3/uL (ref 3.4–10.8)

## 2016-01-20 LAB — CREATININE, SERUM
CREATININE: 0.57 mg/dL (ref 0.57–1.00)
GFR calc Af Amer: 149 mL/min/{1.73_m2} (ref >59)
GFR calc non Af Amer: 129 mL/min/{1.73_m2} (ref >59)

## 2016-01-20 LAB — AST: AST: 25 IU/L (ref 0–40)

## 2016-01-20 LAB — PROTEIN / CREATININE RATIO, URINE
Creatinine, Urine: 56.8 mg/dL
PROTEIN UR: 10.7 mg/dL
PROTEIN/CREAT RATIO: 188 mg/g{creat} (ref 0–200)

## 2016-01-21 LAB — PATHOLOGIST SMEAR REVIEW
PATH REV WBC: NORMAL
Path Rev RBC: NORMAL

## 2016-01-25 ENCOUNTER — Encounter (HOSPITAL_COMMUNITY): Payer: Self-pay | Admitting: *Deleted

## 2016-01-25 ENCOUNTER — Other Ambulatory Visit: Payer: Self-pay | Admitting: Certified Nurse Midwife

## 2016-01-25 ENCOUNTER — Ambulatory Visit (INDEPENDENT_AMBULATORY_CARE_PROVIDER_SITE_OTHER): Payer: 59 | Admitting: Obstetrics

## 2016-01-25 ENCOUNTER — Inpatient Hospital Stay (HOSPITAL_COMMUNITY)
Admission: AD | Admit: 2016-01-25 | Discharge: 2016-01-29 | DRG: 775 | Disposition: A | Discharge status: Home / Self Care | Payer: 59 | Source: Ambulatory Visit | Attending: Obstetrics and Gynecology | Admitting: Obstetrics and Gynecology

## 2016-01-25 VITALS — BP 140/90 | HR 96 | Temp 97.9°F | Wt 205.4 lb

## 2016-01-25 DIAGNOSIS — Z833 Family history of diabetes mellitus: Secondary | ICD-10-CM

## 2016-01-25 DIAGNOSIS — Z803 Family history of malignant neoplasm of breast: Secondary | ICD-10-CM

## 2016-01-25 DIAGNOSIS — Z1389 Encounter for screening for other disorder: Secondary | ICD-10-CM

## 2016-01-25 DIAGNOSIS — O2243 Hemorrhoids in pregnancy, third trimester: Secondary | ICD-10-CM | POA: Diagnosis not present

## 2016-01-25 DIAGNOSIS — Z3403 Encounter for supervision of normal first pregnancy, third trimester: Secondary | ICD-10-CM | POA: Diagnosis not present

## 2016-01-25 DIAGNOSIS — O133 Gestational [pregnancy-induced] hypertension without significant proteinuria, third trimester: Secondary | ICD-10-CM

## 2016-01-25 DIAGNOSIS — O24420 Gestational diabetes mellitus in childbirth, diet controlled: Secondary | ICD-10-CM | POA: Diagnosis not present

## 2016-01-25 DIAGNOSIS — Z332 Encounter for elective termination of pregnancy: Secondary | ICD-10-CM | POA: Diagnosis not present

## 2016-01-25 DIAGNOSIS — O134 Gestational [pregnancy-induced] hypertension without significant proteinuria, complicating childbirth: Principal | ICD-10-CM | POA: Diagnosis present

## 2016-01-25 DIAGNOSIS — Z8249 Family history of ischemic heart disease and other diseases of the circulatory system: Secondary | ICD-10-CM

## 2016-01-25 DIAGNOSIS — D649 Anemia, unspecified: Secondary | ICD-10-CM | POA: Diagnosis present

## 2016-01-25 DIAGNOSIS — O9962 Diseases of the digestive system complicating childbirth: Secondary | ICD-10-CM | POA: Diagnosis present

## 2016-01-25 DIAGNOSIS — K219 Gastro-esophageal reflux disease without esophagitis: Secondary | ICD-10-CM | POA: Diagnosis present

## 2016-01-25 DIAGNOSIS — Z3A38 38 weeks gestation of pregnancy: Secondary | ICD-10-CM

## 2016-01-25 DIAGNOSIS — O9902 Anemia complicating childbirth: Secondary | ICD-10-CM | POA: Diagnosis present

## 2016-01-25 LAB — CBC
HCT: 33 % — ABNORMAL LOW (ref 36.0–46.0)
Hemoglobin: 11.1 g/dL — ABNORMAL LOW (ref 12.0–15.0)
MCH: 27.6 pg (ref 26.0–34.0)
MCHC: 33.6 g/dL (ref 30.0–36.0)
MCV: 82.1 fL (ref 78.0–100.0)
PLATELETS: 155 10*3/uL (ref 150–400)
RBC: 4.02 MIL/uL (ref 3.87–5.11)
RDW: 13.8 % (ref 11.5–15.5)
WBC: 7.2 10*3/uL (ref 4.0–10.5)

## 2016-01-25 LAB — PROTEIN / CREATININE RATIO, URINE
CREATININE, URINE: 47 mg/dL
Total Protein, Urine: <6 mg/dL

## 2016-01-25 LAB — COMPREHENSIVE METABOLIC PANEL
ALBUMIN: 3.4 g/dL — AB (ref 3.5–5.0)
ALK PHOS: 114 U/L (ref 38–126)
ALT: 19 U/L (ref 14–54)
ANION GAP: 7 (ref 5–15)
AST: 26 U/L (ref 15–41)
BUN: 6 mg/dL (ref 6–20)
CALCIUM: 9 mg/dL (ref 8.9–10.3)
CO2: 19 mmol/L — AB (ref 22–32)
Chloride: 106 mmol/L (ref 101–111)
Creatinine, Ser: 0.57 mg/dL (ref 0.44–1.00)
GFR calc Af Amer: >60 mL/min (ref >60)
GFR calc non Af Amer: >60 mL/min (ref >60)
GLUCOSE: 103 mg/dL — AB (ref 65–99)
POTASSIUM: 3.7 mmol/L (ref 3.5–5.1)
SODIUM: 132 mmol/L — AB (ref 135–145)
Total Bilirubin: 0.4 mg/dL (ref 0.3–1.2)
Total Protein: 6.1 g/dL — ABNORMAL LOW (ref 6.5–8.1)

## 2016-01-25 LAB — POCT URINALYSIS DIPSTICK
Bilirubin, UA: NEGATIVE
Blood, UA: NEGATIVE
GLUCOSE UA: NEGATIVE
Ketones, UA: NEGATIVE
LEUKOCYTES UA: NEGATIVE
NITRITE UA: NEGATIVE
Protein, UA: NEGATIVE
SPEC GRAV UA: 1.01
UROBILINOGEN UA: NEGATIVE
pH, UA: 7

## 2016-01-25 LAB — TYPE AND SCREEN
ABO/RH(D): B POS
Antibody Screen: NEGATIVE

## 2016-01-25 MED ORDER — TERBUTALINE SULFATE 1 MG/ML IJ SOLN
0.2500 mg | Freq: Once | INTRAMUSCULAR | Status: DC | PRN
  Filled 2016-01-25: qty 1

## 2016-01-25 MED ORDER — LIDOCAINE HCL (PF) 1 % IJ SOLN
30.0000 mL | INTRAMUSCULAR | Status: DC | PRN
  Filled 2016-01-25: qty 30

## 2016-01-25 MED ORDER — LACTATED RINGERS IV SOLN
INTRAVENOUS | Status: DC
  Administered 2016-01-25 – 2016-01-27 (×4): via INTRAVENOUS

## 2016-01-25 MED ORDER — OXYCODONE-ACETAMINOPHEN 5-325 MG PO TABS: 1.0000 | ORAL_TABLET | ORAL | Status: DC | PRN

## 2016-01-25 MED ORDER — OXYTOCIN 40 UNITS IN LACTATED RINGERS INFUSION - SIMPLE MED
2.5000 [IU]/h | INTRAVENOUS | Status: DC
  Filled 2016-01-25 (×2): qty 1000

## 2016-01-25 MED ORDER — OXYTOCIN BOLUS FROM INFUSION: 500.0000 mL | Freq: Once | INTRAVENOUS | Status: DC

## 2016-01-25 MED ORDER — LACTATED RINGERS IV SOLN: 500.0000 mL | INTRAVENOUS | Status: DC | PRN

## 2016-01-25 MED ORDER — OXYCODONE-ACETAMINOPHEN 5-325 MG PO TABS: 2.0000 | ORAL_TABLET | ORAL | Status: DC | PRN

## 2016-01-25 MED ORDER — SOD CITRATE-CITRIC ACID 500-334 MG/5ML PO SOLN: 30.0000 mL | ORAL | Status: DC | PRN

## 2016-01-25 MED ORDER — ONDANSETRON HCL 4 MG/2ML IJ SOLN: 4.0000 mg | Freq: Four times a day (QID) | INTRAMUSCULAR | Status: DC | PRN

## 2016-01-25 MED ORDER — MISOPROSTOL 25 MCG QUARTER TABLET
25.0000 ug | ORAL_TABLET | ORAL | Status: DC | PRN
  Administered 2016-01-25: 25 ug via VAGINAL
  Filled 2016-01-25: qty 0.25
  Filled 2016-01-25: qty 1

## 2016-01-25 MED ORDER — ACETAMINOPHEN 325 MG PO TABS: 650.0000 mg | ORAL_TABLET | ORAL | Status: DC | PRN

## 2016-01-25 NOTE — Progress Notes (Signed)
LABOR PROGRESS NOTE  Connie Cobb is a 25 y.o. G2P0010 at [redacted]w[redacted]d  admitted for IOL for gHTN.  Subjective: Pt resting comfortably.  Objective: BP 103/86   Pulse 76   Temp 98.2 F (36.8 C) (Oral)   Resp 17   Ht 5\' 7"  (1.702 m)   Wt 93 kg (205 lb)   LMP 05/08/2015 (Exact Date)   SpO2 100%   BMI 32.11 kg/m  or  Vitals:   01/25/16 1825 01/25/16 1919 01/25/16 1939 01/25/16 2126  BP: 139/77  135/87 103/86  Pulse: 76  83 76  Resp: 17     Temp:  98 F (36.7 C)  98.2 F (36.8 C)  TempSrc:  Oral  Oral  SpO2:      Weight:      Height:        Dilation: 1 Effacement (%): 70 Cervical Position: Posterior Station: Ballotable Presentation: Vertex Exam by::  Chanetta Marshall MD)  Labs: Lab Results  Component Value Date   WBC 7.2 01/25/2016   HGB 11.1 (L) 01/25/2016   HCT 33.0 (L) 01/25/2016   MCV 82.1 01/25/2016   PLT 155 01/25/2016    Patient Active Problem List   Diagnosis Date Noted  . Gestational hypertension w/o significant proteinuria in 3rd trimester 01/25/2016  . Encounter for supervision of other normal pregnancy in third trimester 01/19/2016  . Ringing in right ear 06/15/2014  . Inattention 01/23/2013  . Migraine, unspecified, without mention of intractable migraine without mention of status migrainosus 01/23/2013  . Chlamydia 12/22/2011  . GERD (gastroesophageal reflux disease) 10/29/2011  . Sinusitis 08/21/2011  . Allergic rhinitis, seasonal 08/21/2011  . UTI (lower urinary tract infection) 08/21/2011  . Screening for malignant neoplasm of the cervix 05/22/2011  . Routine gynecological examination 05/22/2011  . Family history of breast cancer 05/22/2011  . Contact with or exposure to other viral diseases(V01.79) 12/01/2010  . Anxiety 12/01/2010  . RINGWORM 08/01/2010  . VAGINITIS 08/01/2010  . ANEMIA-NOS 05/19/2010    Assessment / Plan: 25 y.o. G2P0010 at [redacted]w[redacted]d here for IOL for gHTN.  Labor: foley placed @2300  Fetal Wellbeing:  Cat 1 Pain Control:  None  desired at this time Anticipated MOD:  SVD  Loni Muse, MD 01/25/2016, 11:11 PM

## 2016-01-25 NOTE — H&P (Signed)
LABOR AND DELIVERY ADMISSION HISTORY AND PHYSICAL NOTE  Connie Cobb is a 25 y.o. female G2P0010 with IUP at [redacted]w[redacted]d by 5wk 5d [place] presenting for induction of labor for gestational HTN and GDMA1  She reports positive fetal movement. She denies leakage of fluid or vaginal bleeding.  Prenatal History/Complications:  Past Medical History: Past Medical History:  Diagnosis Date  . Allergy   . Anemia   . GERD (gastroesophageal reflux disease)   . Migraine     Past Surgical History: Past Surgical History:  Procedure Laterality Date  . TONSILLECTOMY      Obstetrical History: OB History    Gravida Para Term Preterm AB Living   2       1     SAB TAB Ectopic Multiple Live Births                  Social History: Social History   Social History  . Marital status: Single    Spouse name: N/A  . Number of children: N/A  . Years of education: N/A   Social History Main Topics  . Smoking status: Never Smoker  . Smokeless tobacco: Never Used  . Alcohol use No  . Drug use: No  . Sexual activity: Not Currently    Birth control/ protection: Abstinence   Other Topics Concern  . None   Social History Narrative  . None    Family History: Family History  Problem Relation Age of Onset  . Diabetes Father   . Hyperlipidemia Father   . Hypertension Father   . Hypertension Maternal Grandmother   . Cancer Maternal Grandfather   . Heart disease Maternal Grandfather   . Hypertension Maternal Grandfather   . Cancer Paternal Grandmother   . Diabetes      grandparent  . Breast cancer      grandmother    Allergies: Allergies  Allergen Reactions  . Sulfonamide Derivatives Hives    Prescriptions Prior to Admission  Medication Sig Dispense Refill Last Dose  . ferrous sulfate 325 (65 FE) MG tablet Take 325 mg by mouth daily with breakfast.   01/25/2016 at Unknown time  . omeprazole (PRILOSEC) 20 MG capsule Take 1 capsule (20 mg total) by mouth 2 (two) times daily before a meal. 60  capsule 5 01/24/2016 at Unknown time  . Prenat-Fe Carbonyl-FA-Omega 3 (ONE-A-DAY WOMENS PRENATAL 1 PO) Take 1 tablet by mouth daily.    01/25/2016 at Unknown time  . promethazine (PHENERGAN) 12.5 MG tablet Take 12.5 mg by mouth every 6 (six) hours as needed for nausea or vomiting. Reported on 01/19/2016   prn     Review of Systems   All systems reviewed and negative except as stated in HPI  Blood pressure 139/77, pulse 76, temperature 98 F (36.7 C), temperature source Oral, resp. rate 17, height  (1.702 m), weight 205 lb (93 kg), last menstrual period 05/08/2015, SpO2 100 %. General appearance: alert, cooperative and appears stated age Lungs: clear to auscultation bilaterally Heart: regular rate and rhythm Abdomen: soft, non-tender; bowel sounds normal Extremities: No calf swelling or tenderness Presentation: cephalic Fetal monitoring: continuous monitoring cate 1 tracing Uterine activity: no contraction Dilation: 1 Effacement (%): 50 Station: Ballotable Exam by:: Genevie Ann, MD   Prenatal labs: ABO, Rh: --/--/B POS (07/25 1730) Antibody: NEG (07/25 1730) Rubella: !Error! RPR: Reactive (05/02 0000)  HBsAg: Negative (02/02 0000)  HIV: Non Reactive (12/11 1655)  GBS: Negative (07/06 1706)  3 hour glucola abnormal   Prenatal  Transfer Tool  Maternal Diabetes: Yes:  Diabetes Type:  Diet controlled Genetic Screening: Declined Maternal Ultrasounds/Referrals: Normal Fetal Ultrasounds or other Referrals:  None Maternal Substance Abuse:  No Significant Maternal Medications:  None Significant Maternal Lab Results: None  Results for orders placed or performed during the hospital encounter of 01/25/16 (from the past 24 hour(s))  CBC   Collection Time: 01/25/16  5:30 PM  Result Value Ref Range   WBC 7.2 4.0 - 10.5 K/uL   RBC 4.02 3.87 - 5.11 MIL/uL   Hemoglobin 11.1 (L) 12.0 - 15.0 g/dL   HCT 16.1 (L) 09.6 - 04.5 %   MCV 82.1 78.0 - 100.0 fL   MCH 27.6 26.0 - 34.0 pg   MCHC  33.6 30.0 - 36.0 g/dL   RDW 40.9 81.1 - 91.4 %   Platelets 155 150 - 400 K/uL  Comprehensive metabolic panel   Collection Time: 01/25/16  5:30 PM  Result Value Ref Range   Sodium 132 (L) 135 - 145 mmol/L   Potassium 3.7 3.5 - 5.1 mmol/L   Chloride 106 101 - 111 mmol/L   CO2 19 (L) 22 - 32 mmol/L   Glucose, Bld 103 (H) 65 - 99 mg/dL   BUN 6 6 - 20 mg/dL   Creatinine, Ser 7.82 0.44 - 1.00 mg/dL   Calcium 9.0 8.9 - 95.6 mg/dL   Total Protein 6.1 (L) 6.5 - 8.1 g/dL   Albumin 3.4 (L) 3.5 - 5.0 g/dL   AST 26 15 - 41 U/L   ALT 19 14 - 54 U/L   Alkaline Phosphatase 114 38 - 126 U/L   Total Bilirubin 0.4 0.3 - 1.2 mg/dL   GFR calc non Af Amer >60 >60 mL/min   GFR calc Af Amer >60 >60 mL/min   Anion gap 7 5 - 15  Type and screen [facility]   Collection Time: 01/25/16  5:30 PM  Result Value Ref Range   ABO/RH(D) B POS    Antibody Screen NEG    Sample Expiration 01/28/2016   Protein / creatinine ratio, urine   Collection Time: 01/25/16  6:18 PM  Result Value Ref Range   Creatinine, Urine 47.00 mg/dL   Total Protein, Urine <6 mg/dL   Protein Creatinine Ratio        0.00 - 0.15 mg/mg[Cre]  Results for orders placed or performed in visit on 01/25/16 (from the past 24 hour(s))  POCT urinalysis dipstick   Collection Time: 01/25/16  3:49 PM  Result Value Ref Range   Color, UA yellow    Clarity, UA clear    Glucose, UA neg    Bilirubin, UA neg    Ketones, UA neg    Spec Grav, UA 1.010    Blood, UA neg    pH, UA 7.0    Protein, UA neg    Urobilinogen, UA negative    Nitrite, UA neg    Leukocytes, UA Negative Negative    Patient Active Problem List   Diagnosis Date Noted  . Gestational hypertension w/o significant proteinuria in 3rd trimester 01/25/2016  . Encounter for supervision of other normal pregnancy in third trimester 01/19/2016  . Ringing in right ear 06/15/2014  . Inattention 01/23/2013  . Migraine, unspecified, without mention of intractable  migraine without mention of status migrainosus 01/23/2013  . Chlamydia 12/22/2011  . GERD (gastroesophageal reflux disease) 10/29/2011  . Sinusitis 08/21/2011  . Allergic rhinitis, seasonal 08/21/2011  . UTI (lower urinary tract infection) 08/21/2011  .  Screening for malignant neoplasm of the cervix 05/22/2011  . Routine gynecological examination 05/22/2011  . Family history of breast cancer 05/22/2011  . Contact with or exposure to other viral diseases(V01.79) 12/01/2010  . Anxiety 12/01/2010  . RINGWORM 08/01/2010  . VAGINITIS 08/01/2010  . ANEMIA-NOS 05/19/2010    Assessment: Connie Cobb is a 25 y.o. G2P0010 at [redacted]w[redacted]d here for IOL at 38 wks for GDMA1 and GHTN  #Labor:induction of labor cytotec given anticpate foley and transition to pitocin #Pain: Epidural when appropriate #FWB: Category 1 #ID:  GBS neg #MOF: breat #MOC: IUD #Circ:  Yes inpatinet  Ernestina Penna 01/25/2016, 8:25 PM

## 2016-01-25 NOTE — Progress Notes (Signed)
LABOR PROGRESS NOTE  Connie Cobb is a 25 y.o. G2P0010 at [redacted]w[redacted]d  admitted for IOL for gHTN.  Subjective: Pt resting comfortably.  Objective: BP 135/87   Pulse 83   Temp 98 F (36.7 C) (Oral)   Resp 17   Ht 5\' 7"  (1.702 m)   Wt 93 kg (205 lb)   LMP 05/08/2015 (Exact Date)   SpO2 100%   BMI 32.11 kg/m  or  Vitals:   01/25/16 1725 01/25/16 1825 01/25/16 1919 01/25/16 1939  BP: (!) 138/97 139/77  135/87  Pulse: 90 76  83  Resp: 16 17    Temp: 98.5 F (36.9 C)  98 F (36.7 C)   TempSrc: Oral  Oral   SpO2: 100%     Weight: 93 kg (205 lb)     Height: 5\' 7"  (1.702 m)       Dilation: 1 Effacement (%): 50 Station: Ballotable Presentation: Vertex Exam by:: Genevie Ann, MD  Labs: Lab Results  Component Value Date   WBC 7.2 01/25/2016   HGB 11.1 (L) 01/25/2016   HCT 33.0 (L) 01/25/2016   MCV 82.1 01/25/2016   PLT 155 01/25/2016    Patient Active Problem List   Diagnosis Date Noted  . Gestational hypertension w/o significant proteinuria in 3rd trimester 01/25/2016  . Encounter for supervision of other normal pregnancy in third trimester 01/19/2016  . Ringing in right ear 06/15/2014  . Inattention 01/23/2013  . Migraine, unspecified, without mention of intractable migraine without mention of status migrainosus 01/23/2013  . Chlamydia 12/22/2011  . GERD (gastroesophageal reflux disease) 10/29/2011  . Sinusitis 08/21/2011  . Allergic rhinitis, seasonal 08/21/2011  . UTI (lower urinary tract infection) 08/21/2011  . Screening for malignant neoplasm of the cervix 05/22/2011  . Routine gynecological examination 05/22/2011  . Family history of breast cancer 05/22/2011  . Contact with or exposure to other viral diseases(V01.79) 12/01/2010  . Anxiety 12/01/2010  . RINGWORM 08/01/2010  . VAGINITIS 08/01/2010  . ANEMIA-NOS 05/19/2010    Assessment / Plan: 25 y.o. G2P0010 at [redacted]w[redacted]d here for IOL for gHTN.   Labor: s/p cytotec x1  Fetal Wellbeing:  Cat 1 Pain Control:   Desires none at this time Anticipated MOD:  SVD  Loni Muse, MD 01/25/2016, 9:10 PM

## 2016-01-25 NOTE — Progress Notes (Signed)
Patient ID: Connie Cobb, female   DOB: 1991-02-12, 25 y.o.   MRN: [id number] Subjective:    Connie Cobb is a 25 y.o. female being seen today for her obstetrical visit. She is at [redacted]w[redacted]d gestation. Patient reports headache. Fetal movement: normal.  Problem List Items Addressed This Visit    None    Visit Diagnoses    Encounter for supervision of normal first pregnancy in third trimester    -  Primary   Relevant Orders   POCT urinalysis dipstick     Patient Active Problem List   Diagnosis Date Noted  . Encounter for supervision of other normal pregnancy in third trimester 01/19/2016  . Ringing in right ear 06/15/2014  . Inattention 01/23/2013  . Migraine, unspecified, without mention of intractable migraine without mention of status migrainosus 01/23/2013  . Chlamydia 12/22/2011  . GERD (gastroesophageal reflux disease) 10/29/2011  . Sinusitis 08/21/2011  . Allergic rhinitis, seasonal 08/21/2011  . UTI (lower urinary tract infection) 08/21/2011  . Screening for malignant neoplasm of the cervix 05/22/2011  . Routine gynecological examination 05/22/2011  . Family history of breast cancer 05/22/2011  . Contact with or exposure to other viral diseases(V01.79) 12/01/2010  . Anxiety 12/01/2010  . RINGWORM 08/01/2010  . VAGINITIS 08/01/2010  . ANEMIA-NOS 05/19/2010    Objective:    BP (!) 143/90   Pulse 96   Temp 97.9 F (36.6 C)   Wt 205 lb 6.4 oz (93.2 kg)   LMP 05/08/2015 (Exact Date)   BMI 31.23 kg/m  FHT: 150 BPM  Uterine Size: size greater than dates  Presentations: cephalic  Pelvic Exam:              Dilation: 1cm       Effacement: 50%             Station:  -2    Consistency: soft            Position: posterior     Assessment:    Pregnancy @ [redacted]w[redacted]d weeks    Elevated BP  Plan:   Plans for delivery: Vaginal anticipated; labs reviewed; problem list updated Counseling: Consent signed. Infant feeding: plans to breastfeed. L&D discussion: symptoms of labor,  discussed when to call, discussed what number to call, anesthetic/analgesic options reviewed and delivering clinician:  plans no preference. Postpartum supports and preparation: circumcision discussed and contraception plans discussed. Sent to [facility] for further evaluation of BP.  Follow up in 1 Week.

## 2016-01-25 NOTE — Progress Notes (Signed)
Patient is feeling a lot of pressure.

## 2016-01-25 NOTE — Anesthesia Pain Management Evaluation Note (Signed)
  CRNA Pain Management Visit Note  Patient: Connie Cobb, 25 y.o., female  "Hello I am a member of the anesthesia team at [facility]. We have an anesthesia team available at all times to provide care throughout the hospital, including epidural management and anesthesia for C-section. I don't know your plan for the delivery whether it a natural birth, water birth, IV sedation, nitrous supplementation, doula or epidural, but we want to meet your pain goals."   1.Was your pain managed to your expectations on prior hospitalizations?   Yes   2.What is your expectation for pain management during this hospitalization?     Labor support without medications  3.How can we help you reach that goal? unsure  Record the patient's initial score and the patient's pain goal.   Pain: 5  Pain Goal: 10 The [facility] wants you to be able to say your pain was always managed very well.  Cephus Shelling 01/25/2016

## 2016-01-26 ENCOUNTER — Encounter (HOSPITAL_COMMUNITY): Payer: Self-pay

## 2016-01-26 LAB — RPR: RPR Ser Ql: NONREACTIVE

## 2016-01-26 LAB — GLUCOSE, CAPILLARY
Glucose-Capillary: 105 mg/dL — ABNORMAL HIGH (ref 65–99)
Glucose-Capillary: 108 mg/dL — ABNORMAL HIGH (ref 65–99)

## 2016-01-26 MED ORDER — ZOLPIDEM TARTRATE 5 MG PO TABS
5.0000 mg | ORAL_TABLET | Freq: Every evening | ORAL | Status: DC | PRN
  Administered 2016-01-26: 5 mg via ORAL
  Filled 2016-01-26: qty 1

## 2016-01-26 MED ORDER — TERBUTALINE SULFATE 1 MG/ML IJ SOLN
0.2500 mg | Freq: Once | INTRAMUSCULAR | Status: DC | PRN
  Filled 2016-01-26: qty 1

## 2016-01-26 MED ORDER — OXYTOCIN 40 UNITS IN LACTATED RINGERS INFUSION - SIMPLE MED
1.0000 m[IU]/min | INTRAVENOUS | Status: DC
  Administered 2016-01-26: 2 m[IU]/min via INTRAVENOUS

## 2016-01-26 NOTE — Progress Notes (Signed)
Patient changing positions frequently. Intermittent fetal monitoring

## 2016-01-26 NOTE — Progress Notes (Signed)
Patient up to shower at this moment. Monitors off at this time. Will continue to monitor.

## 2016-01-26 NOTE — Anesthesia Pain Management Evaluation Note (Deleted)
  CRNA Pain Management Visit Note  Patient: Connie Cobb, 25 y.o., female  "Hello I am a member of the anesthesia team at [facility]. We have an anesthesia team available at all times to provide care throughout the hospital, including epidural management and anesthesia for C-section. I don't know your plan for the delivery whether it a natural birth, water birth, IV sedation, nitrous supplementation, doula or epidural, but we want to meet your pain goals."   1.Was your pain managed to your expectations on prior hospitalizations?   Yes   2.What is your expectation for pain management during this hospitalization?     Epidural  3.How can we help you reach that goal?  Record the patient's initial score and the patient's pain goal.   Pain: 0  Pain Goal: 7 [facility] wants you to be able to say your pain was always managed very well.  Cephus Shelling 01/26/2016

## 2016-01-26 NOTE — Progress Notes (Signed)
Patient ID: Connie Cobb, female   DOB: 02-Dec-1990, 24 y.o.   MRN: [id number]  Not feeling ctx  Vitals:   01/26/16 1932 01/26/16 2252  BP:    Pulse:    Resp: 16   Temp: 98.7 F (37.1 C) 98.5 F (36.9 C)   BP: 127/85, 90  FHR 140s, +accels, occ mi variables Ctx q 2 mins w/ Pit @ 26mu/min Cx 4/70/-2; AROM for clear fluid  IUP@term  gHTN- stable BPs IOL process  Hopeful that ROM will help w/ getting pt into labor Examine cx in 2-3hrs  SHAW, [facility] 01/26/2016 11:01 PM

## 2016-01-26 NOTE — Progress Notes (Signed)
LABOR PROGRESS NOTE  Connie Cobb is a 25 y.o. G2P0010 at [redacted]w[redacted]d  admitted for IOL for gHTN  Subjective: Pt more comfortable after foley fell out.  Objective: BP 120/67   Pulse 73   Temp 98.5 F (36.9 C) (Oral)   Resp 18   Ht 5\' 7"  (1.702 m)   Wt 93 kg (205 lb)   LMP 05/08/2015 (Exact Date)   SpO2 100%   BMI 32.11 kg/m  or  Vitals:   01/25/16 1939 01/25/16 2126 01/26/16 0035 01/26/16 0500  BP: 135/87 103/86 120/67   Pulse: 83 76 73   Resp:   18   Temp:  98.2 F (36.8 C) 98 F (36.7 C) 98.5 F (36.9 C)  TempSrc:  Oral Oral Oral  SpO2:      Weight:      Height:        Dilation: 4 Effacement (%): 60 Cervical Position: Posterior Station: -3, -2 Presentation: Vertex Exam by:: amwalker,rn  Labs: Lab Results  Component Value Date   WBC 7.2 01/25/2016   HGB 11.1 (L) 01/25/2016   HCT 33.0 (L) 01/25/2016   MCV 82.1 01/25/2016   PLT 155 01/25/2016    Patient Active Problem List   Diagnosis Date Noted  . Gestational hypertension w/o significant proteinuria in 3rd trimester 01/25/2016  . Encounter for supervision of other normal pregnancy in third trimester 01/19/2016  . Ringing in right ear 06/15/2014  . Inattention 01/23/2013  . Migraine, unspecified, without mention of intractable migraine without mention of status migrainosus 01/23/2013  . Chlamydia 12/22/2011  . GERD (gastroesophageal reflux disease) 10/29/2011  . Sinusitis 08/21/2011  . Allergic rhinitis, seasonal 08/21/2011  . UTI (lower urinary tract infection) 08/21/2011  . Screening for malignant neoplasm of the cervix 05/22/2011  . Routine gynecological examination 05/22/2011  . Family history of breast cancer 05/22/2011  . Contact with or exposure to other viral diseases(V01.79) 12/01/2010  . Anxiety 12/01/2010  . RINGWORM 08/01/2010  . VAGINITIS 08/01/2010  . ANEMIA-NOS 05/19/2010    Assessment / Plan: 25 y.o. G2P0010 at [redacted]w[redacted]d here for IOL for gHTN.  Labor: s/p cytotec, foley out at 0445. Pit  2x2 Fetal Wellbeing:  Cat 1 Pain Control:  None desired at this time Anticipated MOD:  SVD  Loni Muse, MD 01/26/2016, 5:16 AM

## 2016-01-27 ENCOUNTER — Encounter (HOSPITAL_COMMUNITY): Payer: Self-pay | Admitting: *Deleted

## 2016-01-27 DIAGNOSIS — O9902 Anemia complicating childbirth: Secondary | ICD-10-CM

## 2016-01-27 DIAGNOSIS — O134 Gestational [pregnancy-induced] hypertension without significant proteinuria, complicating childbirth: Secondary | ICD-10-CM

## 2016-01-27 DIAGNOSIS — O2243 Hemorrhoids in pregnancy, third trimester: Secondary | ICD-10-CM

## 2016-01-27 DIAGNOSIS — O9962 Diseases of the digestive system complicating childbirth: Secondary | ICD-10-CM

## 2016-01-27 DIAGNOSIS — O24420 Gestational diabetes mellitus in childbirth, diet controlled: Secondary | ICD-10-CM

## 2016-01-27 DIAGNOSIS — Z3A38 38 weeks gestation of pregnancy: Secondary | ICD-10-CM

## 2016-01-27 MED ORDER — COCONUT OIL OIL
1.0000 "application " | TOPICAL_OIL | Status: DC | PRN
  Administered 2016-01-28: 1 via TOPICAL
  Filled 2016-01-27: qty 120

## 2016-01-27 MED ORDER — ONDANSETRON HCL 4 MG/2ML IJ SOLN: 4.0000 mg | INTRAMUSCULAR | Status: DC | PRN

## 2016-01-27 MED ORDER — DIBUCAINE 1 % RE OINT
1.0000 "application " | TOPICAL_OINTMENT | RECTAL | Status: DC | PRN
  Administered 2016-01-27: 1 via RECTAL
  Filled 2016-01-27: qty 28

## 2016-01-27 MED ORDER — ONDANSETRON HCL 4 MG PO TABS: 4.0000 mg | ORAL_TABLET | ORAL | Status: DC | PRN

## 2016-01-27 MED ORDER — SENNOSIDES-DOCUSATE SODIUM 8.6-50 MG PO TABS
2.0000 | ORAL_TABLET | ORAL | Status: DC
  Administered 2016-01-27 – 2016-01-28 (×2): 2 via ORAL
  Filled 2016-01-27 (×2): qty 2

## 2016-01-27 MED ORDER — ZOLPIDEM TARTRATE 5 MG PO TABS: 5.0000 mg | ORAL_TABLET | Freq: Every evening | ORAL | Status: DC | PRN

## 2016-01-27 MED ORDER — WITCH HAZEL-GLYCERIN EX PADS
1.0000 "application " | MEDICATED_PAD | CUTANEOUS | Status: DC | PRN
  Administered 2016-01-27: 1 via TOPICAL

## 2016-01-27 MED ORDER — IBUPROFEN 600 MG PO TABS
600.0000 mg | ORAL_TABLET | Freq: Four times a day (QID) | ORAL | Status: DC
  Administered 2016-01-27 – 2016-01-29 (×9): 600 mg via ORAL
  Filled 2016-01-27 (×9): qty 1

## 2016-01-27 MED ORDER — SIMETHICONE 80 MG PO CHEW: 80.0000 mg | CHEWABLE_TABLET | ORAL | Status: DC | PRN

## 2016-01-27 MED ORDER — TETANUS-DIPHTH-ACELL PERTUSSIS 5-2.5-18.5 LF-MCG/0.5 IM SUSP
0.5000 mL | Freq: Once | INTRAMUSCULAR | Status: AC
  Administered 2016-01-28: 0.5 mL via INTRAMUSCULAR

## 2016-01-27 MED ORDER — DIPHENHYDRAMINE HCL 25 MG PO CAPS: 25.0000 mg | ORAL_CAPSULE | Freq: Four times a day (QID) | ORAL | Status: DC | PRN

## 2016-01-27 MED ORDER — BENZOCAINE-MENTHOL 20-0.5 % EX AERO
1.0000 "application " | INHALATION_SPRAY | CUTANEOUS | Status: DC | PRN
  Administered 2016-01-27: 1 via TOPICAL
  Filled 2016-01-27 (×2): qty 56

## 2016-01-27 MED ORDER — FENTANYL CITRATE (PF) 100 MCG/2ML IJ SOLN
100.0000 ug | INTRAMUSCULAR | Status: DC | PRN
  Administered 2016-01-27 (×9): 100 ug via INTRAVENOUS
  Filled 2016-01-27 (×9): qty 2

## 2016-01-27 MED ORDER — ACETAMINOPHEN 325 MG PO TABS: 650.0000 mg | ORAL_TABLET | ORAL | Status: DC | PRN

## 2016-01-27 MED ORDER — PRENATAL MULTIVITAMIN CH
1.0000 | ORAL_TABLET | Freq: Every day | ORAL | Status: DC
  Administered 2016-01-28 – 2016-01-29 (×2): 1 via ORAL
  Filled 2016-01-27 (×2): qty 1

## 2016-01-27 NOTE — Plan of Care (Signed)
Spoke with Connie Cobb about pt SVE, use of of Fent since MDN. Will be in to see pt do SVE and discuss POC.

## 2016-01-27 NOTE — Progress Notes (Signed)
Patient ID: Connie Cobb, female   DOB: 1990-12-17, 25 y.o.   MRN: [id number]  Coping well w/ ctx using Fentanyl; no strong urge to push yet  BP 136/82, other VSS FHR 130s, +accels, no decels Ctx q 2-3 mins w/ Pit @ 72mu/min Cx C/C/vtx +1  IUP@term  End 1st stage gHTN  Expect urge to push within the hour  Cam Hai CNM 01/27/2016 7:18 AM

## 2016-01-27 NOTE — Progress Notes (Signed)
LABOR PROGRESS NOTE  Connie Cobb is a 25 y.o. G2P0010 at [redacted]w[redacted]d  admitted for IOL for gHTN.  Subjective: Pt resting comfortably.   Objective: BP 133/75   Pulse 71   Temp 98.3 F (36.8 C) (Oral)   Resp 18   Ht 5\' 7"  (1.702 m)   Wt 93 kg (205 lb)   LMP 05/08/2015 (Exact Date)   SpO2 100%   BMI 32.11 kg/m  or  Vitals:   01/27/16 0331 01/27/16 0401 01/27/16 0431 01/27/16 0501  BP: 131/84 132/78 135/82 133/75  Pulse: 82 71 79 71  Resp:   18   Temp:   98.3 F (36.8 C)   TempSrc:   Oral   SpO2:      Weight:      Height:        Dilation: Lip/rim Effacement (%): 100 Cervical Position: Anterior Station: +1 Presentation: Vertex Exam by:: Dr. Chanetta Marshall  Labs: Lab Results  Component Value Date   WBC 7.2 01/25/2016   HGB 11.1 (L) 01/25/2016   HCT 33.0 (L) 01/25/2016   MCV 82.1 01/25/2016   PLT 155 01/25/2016    Patient Active Problem List   Diagnosis Date Noted  . Gestational hypertension w/o significant proteinuria in 3rd trimester 01/25/2016  . Encounter for supervision of other normal pregnancy in third trimester 01/19/2016  . Ringing in right ear 06/15/2014  . Inattention 01/23/2013  . Migraine, unspecified, without mention of intractable migraine without mention of status migrainosus 01/23/2013  . Chlamydia 12/22/2011  . GERD (gastroesophageal reflux disease) 10/29/2011  . Sinusitis 08/21/2011  . Allergic rhinitis, seasonal 08/21/2011  . UTI (lower urinary tract infection) 08/21/2011  . Screening for malignant neoplasm of the cervix 05/22/2011  . Routine gynecological examination 05/22/2011  . Family history of breast cancer 05/22/2011  . Contact with or exposure to other viral diseases(V01.79) 12/01/2010  . Anxiety 12/01/2010  . RINGWORM 08/01/2010  . VAGINITIS 08/01/2010  . ANEMIA-NOS 05/19/2010    Assessment / Plan: 25 y.o. G2P0010 at [redacted]w[redacted]d here for IOL for gHTN.   Labor: pit Fetal Wellbeing:  Cat 1 Pain Control:  fentanyl Anticipated MOD:   SVD  Loni Muse, MD 01/27/2016, 5:13 AM

## 2016-01-28 LAB — GLUCOSE, CAPILLARY: Glucose-Capillary: 104 mg/dL — ABNORMAL HIGH (ref 65–99)

## 2016-01-28 MED ORDER — HYDROCORTISONE ACETATE 25 MG RE SUPP
25.0000 mg | Freq: Two times a day (BID) | RECTAL | Status: DC
  Administered 2016-01-28 – 2016-01-29 (×2): 25 mg via RECTAL
  Filled 2016-01-28 (×3): qty 1

## 2016-01-28 NOTE — Progress Notes (Signed)
Post Partum Day #1 Subjective: up ad lib, voiding and tolerating PO.  States hemorrhoids are bothering her.  Is breastfeeding, states baby latches well.    Objective: Blood pressure 117/76, pulse 78, temperature 98.3 F (36.8 C), temperature source Oral, resp. rate 18, height 5\' 7"  (1.702 m), weight 205 lb (93 kg), last menstrual period 05/08/2015, SpO2 100 %, unknown if currently breastfeeding.  Physical Exam:  General: alert, cooperative and no distress Lochia: appropriate Uterine Fundus: firm Incision: no significant drainage DVT Evaluation: No evidence of DVT seen on physical exam. Negative Homan's sign. No cords or calf tenderness. No significant calf/ankle edema.   Recent Labs  01/25/16 1730  HGB 11.1*  HCT 33.0*    Assessment/Plan: Plan for discharge tomorrow, Breastfeeding, Lactation consult and Contraception POP or IUD.  Will see next week in office for B/P check, see in 2 weeks for postpartum exam. Will check BS @ 6week PP exam with pap smear and 2 hour OGTT.  Patient aware of schedule.     LOS: 3 days   Roe Coombs, CNM 01/28/2016, 8:05 AM

## 2016-01-28 NOTE — Progress Notes (Signed)
Order written for CBG on patient at [id number]. When nurse arrived in room to obtain CBG, mother was in the middle of eating and almost finished. Instructed mother to call out for CBG before eating lunch. Connie Cobb, Connie Cobb

## 2016-01-28 NOTE — Lactation Note (Signed)
This note was copied from a baby's chart. Lactation Consultation Note LC has went to room 3 times and mom was sleeping. FOB was awake for the last 2 times. Didn't want mom awaken. Last visit 0600.  Patient Name: Boy Riyana Flaming SLPNP'Y Date: 01/28/2016     Maternal Data    Feeding    LATCH Score/Interventions                      Lactation Tools Discussed/Used     Consult Status      Charyl Dancer 01/28/2016, 6:38 AM

## 2016-01-28 NOTE — Lactation Note (Signed)
This note was copied from a baby's chart. Lactation Consultation Note Mom sleeping soundly. Patient Name: Connie Cobb INOMV'E Date: 01/28/2016     Maternal Data    Feeding    LATCH Score/Interventions                      Lactation Tools Discussed/Used     Consult Status      Pius Byrom, Diamond Nickel 01/28/2016, 2:11 AM

## 2016-01-28 NOTE — Lactation Note (Signed)
This note was copied from a baby's chart. Lactation Consultation Note  Patient Name: Connie Cobb POEUM'P Date: 01/28/2016 Reason for consult: Initial assessment Baby sleepy today, had circumcision this am. Baby giving feeding ques at this visit, assisted Mom with positioning and obtaining depth with latch. Baby demonstrated good suckling bursts with swallows noted. Mom had slight crease across nipple after nursing, both breasts, left breast small blister. Demonstrated and reviewed with parents how to un-tuck lips for more depth. Mom denied pain with nursing when lips were well flanged. Upper lip has difficulty staying un-tucked, possible short labial frenulum.  Mom has some aerola edema make obtaining good depth more difficult. Had Mom pre-pump with hand pump and advised to wear breast shells between feedings. Swelling did improve after baby had nursed. Advised to apply EBM/coconut oil to nipples for tenderness. Encouraged to call for assist as needed with latch. Advised baby should be at breast 8-12 times in 24 hours and with feeding ques, Cluster feeding discussed.   Maternal Data Has patient been taught Hand Expression?: Yes Does the patient have breastfeeding experience prior to this delivery?: No  Feeding Feeding Type: Breast Fed Length of feed: 25 min  LATCH Score/Interventions Latch: Grasps breast easily, tongue down, lips flanged, rhythmical sucking. Intervention(s): Adjust position;Assist with latch;Breast massage;Breast compression  Audible Swallowing: A few with stimulation  Type of Nipple: Everted at rest and after stimulation (aerola edema bilateral)  Comfort (Breast/Nipple): Filling, red/small blisters or bruises, mild/mod discomfort  Problem noted: Mild/Moderate discomfort Interventions (Mild/moderate discomfort): Pre-pump if needed (EBM/coconut oil)  Hold (Positioning): Assistance needed to correctly position infant at breast and maintain latch. Intervention(s):  Breastfeeding basics reviewed;Support Pillows;Position options;Skin to skin  LATCH Score: 7  Lactation Tools Discussed/Used Tools: Shells;Pump;Other (comment) (coconut oil) Shell Type: Inverted Breast pump type: Manual WIC Program: No   Consult Status Consult Status: Follow-up Date: 01/29/16 Follow-up type: In-patient    Alfred Levins 01/28/2016, 2:29 PM

## 2016-01-28 NOTE — Progress Notes (Signed)
MOB was referred for history of depression/anxiety.  Referral is screened out by Clinical Social Worker because none of the following criteria appear to apply: -History of anxiety/depression during this pregnancy, or of post-partum depression.   - Diagnosis of anxiety and/or depression within last 3 years  Anxiety dx is from 2012, not stressors or issues documented in prenatal or medical chart - History of depression due to pregnancy loss/loss of child or -MOB's symptoms are currently being treated with medication and/or therapy.  MOB also was referred to [facility] in 2014 when trying to rule out ADD.  At that time in 2014, she was on medication and having formalized testing completed.    Please contact the Clinical Social Worker if needs arise or upon MOB request.    Brytney Somes LCSW, MSW Clinical Social Work: System Wide Float Coverage for [facility] NICU Clinical social worker [phone] 

## 2016-01-28 NOTE — Progress Notes (Signed)
Attending Circumcision Counseling Progress Note  Patient desires circumcision for her female infant.  Circumcision procedure details discussed, risks and benefits of procedure were also discussed.  These include but are not limited to: Benefits of circumcision in men include reduction in the rates of urinary tract infection (UTI), penile cancer, some sexually transmitted infections, penile inflammatory and retractile disorders, as well as easier hygiene.  Risks include bleeding , infection, injury of glans which may lead to penile deformity or urinary tract issues, unsatisfactory cosmetic appearance and other potential complications related to the procedure.  It was emphasized that this is an elective procedure.  Patient wants to proceed with circumcision; written informed consent obtained.  Will do circumcision soon, routine circumcision and post circumcision care ordered for the infant.  Jaynie Collins, M.D. 01/28/2016 9:57 AM

## 2016-01-29 MED ORDER — HYDROCORTISONE ACE-PRAMOXINE 1-1 % RE CREA: TOPICAL_CREAM | Freq: Three times a day (TID) | RECTAL | Status: DC | PRN

## 2016-01-29 MED ORDER — IBUPROFEN 600 MG PO TABS: 600.0000 mg | ORAL_TABLET | Freq: Four times a day (QID) | ORAL | 0 refills | Status: DC | PRN

## 2016-01-29 NOTE — Discharge Instructions (Signed)

## 2016-01-29 NOTE — Lactation Note (Signed)
This note was copied from a baby's chart. Lactation Consultation Note  Patient Name: Connie Cobb XAJOI'N Date: 01/29/2016 Reason for consult: Follow-up assessment  Baby is 70 hours old and has been breastfeeding consistently both breast .  Per mom the left nipple is tender with a small intact blister on the upper edge  Of the nipple.  @ the consult worked on review of basics - started on the right breast in football position.  Baby latched easily with depth , and feed for 30 mins with multiply swallows, increased  With breast compressions. Per mom latch comfortable.  Baby started falling asleep at the breast and LC had mom  Take the baby off. Baby still acting hungry and LC assisted mom to switch to the left in cross cradle position,  Multiply swallows noted and increased with breast compressions. Depth at the breast obtained  Easily.  Per mom has been using the shells between feedings and pre- pump to make the nipple and areola  More elastic. At these 2 latches massage , hand expressing worked well.  Sore nipple and engorgement prevention and tx reviewed.  Mom already has shells, hand pump, and a DEBP at home.  LC recommended due to the baby cluster feeding and her soreness, to post pump after 2-3 feeedings For 10 mins , so increase volume of milk coming in.  Mother informed of post-discharge support and given phone number to the lactation department, including  services for phone call assistance; out-patient appointments; and breastfeeding support group. List of other  breastfeeding resources in the community given in the handout. Encouraged mother to call for problems or concerns related to breastfeeding. Mom , dad, and grandmother receptive to teaching.    Maternal Data    Feeding Feeding Type: Breast Fed Length of feed: 30 min (multiply swallows noted , increased w/ breast compressions )  LATCH Score/Interventions Latch: Grasps breast easily, tongue down, lips flanged,  rhythmical sucking. Intervention(s): Skin to skin;Teach feeding cues;Waking techniques Intervention(s): Adjust position;Assist with latch;Breast massage;Breast compression  Audible Swallowing: Spontaneous and intermittent  Type of Nipple: Everted at rest and after stimulation  Comfort (Breast/Nipple): Filling, red/small blisters or bruises, mild/mod discomfort     Hold (Positioning): Assistance needed to correctly position infant at breast and maintain latch. Intervention(s): Breastfeeding basics reviewed;Support Pillows;Position options;Skin to skin  LATCH Score: 8  Lactation Tools Discussed/Used Tools: Pump;Shells Shell Type: Inverted Breast pump type: Manual   Consult Status Consult Status: Follow-up Date: 01/29/16 Follow-up type: In-patient    Kathrin Greathouse 01/29/2016, 11:19 AM

## 2016-01-29 NOTE — Discharge Summary (Signed)
OB Discharge Summary     Patient Name: Connie Cobb DOB: 08-07-1990 MRN: [id number]  Date of admission: 01/25/2016 Delivering MD: Andres Ege R   Date of discharge: 01/29/2016  Admitting diagnosis: direct admit, 38w  Intrauterine pregnancy: [redacted]w[redacted]d     Secondary diagnosis:  Active Problems:   Gestational hypertension w/o significant proteinuria in 3rd trimester   Normal delivery  Additional problems: A1GDM     Discharge diagnosis: Term Pregnancy Delivered, Gestational Hypertension and GDM A1                                                                                                Post partum procedures:none  Augmentation: AROM, Pitocin, Cytotec and Foley Balloon  Complications: Shoulder dystocia x 1.5 mins  Hospital course:  Induction of Labor With Vaginal Delivery   25 y.o. yo G2P1011 at [redacted]w[redacted]d was admitted to the hospital 01/25/2016 for induction of labor.  Indication for induction: Gestational hypertension.  Patient had an uncomplicated labor course as follows: Membrane Rupture Time/Date: 10:48 PM ,01/26/2016   Intrapartum Procedures: Episiotomy: None [1]                                         Lacerations:  None [1]  Patient had delivery of a Viable infant.  Information for the patient's newborn:  [place], Dorch [[id number]]  Delivery Method: Vag-Spont   01/27/2016  Details of delivery can be found in separate delivery note. There was a shoulder dystocia x 1.5 mins- details given in note.  Patient had a routine postpartum course. Patient is discharged home 01/29/16.   Physical exam Vitals:   01/27/16 1850 01/28/16 0655 01/28/16 1816 01/29/16 0700  BP: 115/64 117/76 130/85 139/87  Pulse: 93 78 85 81  Resp: 18 18  18   Temp: 98.4 F (36.9 C) 98.3 F (36.8 C) 98.8 F (37.1 C) 99.4 F (37.4 C)  TempSrc: Oral Oral Oral   SpO2:      Weight:      Height:       General: alert and cooperative Lochia: appropriate Uterine Fundus: firm Incision: N/A DVT Evaluation:  No evidence of DVT seen on physical exam. Labs: Lab Results  Component Value Date   WBC 7.2 01/25/2016   HGB 11.1 (L) 01/25/2016   HCT 33.0 (L) 01/25/2016   MCV 82.1 01/25/2016   PLT 155 01/25/2016   CMP Latest Ref Rng & Units 01/25/2016  Glucose 65 - 99 mg/dL 124(P)  BUN 6 - 20 mg/dL 6  Creatinine 8.09 - 9.83 mg/dL 3.82  Sodium 505 - 397 mmol/L 132(L)  Potassium 3.5 - 5.1 mmol/L 3.7  Chloride 101 - 111 mmol/L 106  CO2 22 - 32 mmol/L 19(L)  Calcium 8.9 - 10.3 mg/dL 9.0  Total Protein 6.5 - 8.1 g/dL 6.1(L)  Total Bilirubin 0.3 - 1.2 mg/dL 0.4  Alkaline Phos 38 - 126 U/L 114  AST 15 - 41 U/L 26  ALT 14 - 54 U/L 19    Discharge instruction: per  After Visit Summary and "Baby and Me Booklet".  After visit meds:    Medication List    STOP taking these medications   ferrous sulfate 325 (65 FE) MG tablet   omeprazole 20 MG capsule Commonly known as:  PRILOSEC   promethazine 12.5 MG tablet Commonly known as:  PHENERGAN     TAKE these medications   ibuprofen 600 MG tablet Commonly known as:  ADVIL,MOTRIN Take 1 tablet (600 mg total) by mouth every 6 (six) hours as needed.   ONE-A-DAY WOMENS PRENATAL 1 PO Take 1 tablet by mouth daily.      Proctocream HC for hemorrhoids- prn  Diet: routine diet  Activity: Advance as tolerated. Pelvic rest for 6 weeks.   Outpatient follow up:1 week at [facility] for BP check Follow up Appt:Future Appointments Date Time Provider Department Center  02/02/2016 3:15 PM Brock Bad, MD [facility]   Follow up Visit:No Follow-up on file.  Postpartum contraception: Progesterone only pills  Newborn Data: Live born female  Birth Weight: 8 lb 4.5 oz (3755 g) APGAR: 2, 9  Baby Feeding: Breast Disposition:home with mother   01/29/2016 Cam Hai, CNM  9:27 AM

## 2016-02-01 ENCOUNTER — Encounter: Payer: Self-pay | Admitting: Certified Nurse Midwife

## 2016-02-01 ENCOUNTER — Ambulatory Visit (INDEPENDENT_AMBULATORY_CARE_PROVIDER_SITE_OTHER): Payer: 59 | Admitting: Certified Nurse Midwife

## 2016-02-01 ENCOUNTER — Other Ambulatory Visit: Payer: Self-pay | Admitting: Certified Nurse Midwife

## 2016-02-01 DIAGNOSIS — O165 Unspecified maternal hypertension, complicating the puerperium: Secondary | ICD-10-CM | POA: Diagnosis not present

## 2016-02-01 MED ORDER — LABETALOL HCL 200 MG PO TABS: 200.0000 mg | ORAL_TABLET | Freq: Three times a day (TID) | ORAL | 3 refills | Status: DC

## 2016-02-01 MED ORDER — AMLODIPINE BESYLATE 10 MG PO TABS: 10.0000 mg | ORAL_TABLET | Freq: Every day | ORAL | 3 refills | Status: DC

## 2016-02-01 NOTE — Progress Notes (Signed)
Blood pressure results reviewed.  Has not been on medication.  Medications ordered with precautions.  Patient not seen by provider.  R.Humberto Addo CNM

## 2016-02-02 ENCOUNTER — Encounter: Payer: 59 | Admitting: Obstetrics

## 2016-02-04 ENCOUNTER — Encounter (HOSPITAL_COMMUNITY): Payer: Self-pay | Admitting: *Deleted

## 2016-02-04 ENCOUNTER — Ambulatory Visit (HOSPITAL_COMMUNITY)
Admission: EM | Admit: 2016-02-04 | Discharge: 2016-02-04 | Disposition: A | Discharge status: Home / Self Care | Payer: 59 | Attending: Emergency Medicine | Admitting: Emergency Medicine

## 2016-02-04 DIAGNOSIS — R112 Nausea with vomiting, unspecified: Secondary | ICD-10-CM

## 2016-02-04 DIAGNOSIS — R52 Pain, unspecified: Secondary | ICD-10-CM

## 2016-02-04 DIAGNOSIS — R509 Fever, unspecified: Secondary | ICD-10-CM

## 2016-02-04 NOTE — ED Provider Notes (Signed)
CSN: [id number]     Arrival date & time 02/04/16  1721 History   First MD Initiated Contact with Patient 02/04/16 1855     Chief Complaint  Patient presents with  . Generalized Body Aches   (Consider location/radiation/quality/duration/timing/severity/associated sxs/prior Treatment) HPI  Connie Cobb is a 25 y.o. female presenting to UC with c/o 2 days of generalized body aches with associated nausea and 2 episodes of vomiting in last 24 hours.  She did take phenergan prescribed by her OB/GYN last night with mild relief. She notes she recently gave birth to her first child and was dx with postpartum hypertension.  Pt was prescribed Labetalol  TID and Amlodipine  daily.  She initially thought the labetalol was causing stomach upset so she stopped that medication but still felt bad so she stopped her Amlodipine yesterday but still feels bad today. She notes she has been monitoring her BP and her systolic pressure has stayed under 140. Denies chest pain, SOB, headache or dizziness. Denies vaginal bleeding.  Denies sick contacts. She did receive a Tdap on Friday, 7/28.    Past Medical History:  Diagnosis Date  . Allergy   . Anemia   . GERD (gastroesophageal reflux disease)   . Migraine    Past Surgical History:  Procedure Laterality Date  . TONSILLECTOMY     Family History  Problem Relation Age of Onset  . Diabetes Father   . Hyperlipidemia Father   . Hypertension Father   . Hypertension Maternal Grandmother   . Cancer Maternal Grandfather   . Heart disease Maternal Grandfather   . Hypertension Maternal Grandfather   . Cancer Paternal Grandmother   . Diabetes      grandparent  . Breast cancer      grandmother   Social History  Substance Use Topics  . Smoking status: Never Smoker  . Smokeless tobacco: Never Used  . Alcohol use No   OB History    Gravida Para Term Preterm AB Living   SAB TAB Ectopic Multiple Live Births         0 1     Review of  Systems  Constitutional: Positive for appetite change and fatigue. Negative for chills, diaphoresis and fever.  HENT: Negative for congestion, ear discharge, ear pain and sore throat.   Respiratory: Negative for cough, chest tightness and shortness of breath.   Cardiovascular: Negative for chest pain and palpitations.  Gastrointestinal: Positive for nausea and vomiting. Negative for abdominal pain and diarrhea.  Musculoskeletal: Positive for arthralgias and myalgias. Negative for back pain.       Body aches  Neurological: Negative for dizziness, light-headedness and headaches.    Allergies  Sulfonamide derivatives  Home Medications   Prior to Admission medications   Medication Sig Start Date End Date Taking? Authorizing Provider  amLODipine (NORVASC) 10 MG tablet Take 1 tablet (10 mg total) by mouth daily. 02/01/16   Rachelle A Denney, CNM  ibuprofen (ADVIL,MOTRIN) 600 MG tablet Take 1 tablet (600 mg total) by mouth every 6 (six) hours as needed. 01/29/16   Arabella Merles, CNM  labetalol (NORMODYNE) 200 MG tablet Take 1 tablet (200 mg total) by mouth 3 (three) times daily. 02/01/16   Rachelle A Denney, CNM  Prenat-Fe Carbonyl-FA-Omega 3 (ONE-A-DAY WOMENS PRENATAL 1 PO) Take 1 tablet by mouth daily.     Historical Provider, MD   Meds Ordered and Administered this Visit  Medications - No data to  display  BP 139/89 (BP Location: Left Arm)   Pulse 79   Temp 99.5 F (37.5 C) (Oral)   Resp 12   LMP 05/08/2015 (Exact Date)   SpO2 100%   Breastfeeding? Yes  No data found.   Physical Exam  Constitutional: She is oriented to person, place, and time. She appears well-developed and well-nourished. No distress.  Pt sitting on exam bed, NAD. Non-toxic appearing.  HENT:  Head: Normocephalic and atraumatic.  Right Ear: Tympanic membrane normal.  Left Ear: Tympanic membrane normal.  Nose: Nose normal.  Mouth/Throat: Uvula is midline, oropharynx is clear and moist and mucous membranes are  normal.  Eyes: Conjunctivae are normal. No scleral icterus.  Neck: Normal range of motion. Neck supple.  Cardiovascular: Normal rate, regular rhythm and normal heart sounds.   Pulmonary/Chest: Effort normal and breath sounds normal. No respiratory distress. She has no wheezes. She has no rales.  Abdominal: Soft. She exhibits no distension. There is no tenderness.  Musculoskeletal: Normal range of motion.  Neurological: She is alert and oriented to person, place, and time.  Skin: Skin is warm and dry. She is not diaphoretic.  Nursing note and vitals reviewed.   Urgent Care Course   Clinical Course    Procedures (including critical care time)  Labs Review Labs Reviewed - No data to display  Imaging Review No results found.    MDM   1. Body aches   2. Low grade fever   3. Non-intractable vomiting with nausea, vomiting of unspecified type     Pt c/o body aches, nausea and vomiting. Currently being treated for post-partum hypertension.  Temp 99.5*F  BP- 139/89 in triage.  Pt not taking her BP medication with concern they are causing stomach upset.  Has only vomited twice. Denies abdominal pain.  Reassured pt she may have a viral illness causing symptoms. Encouraged to take her phenergan as prescribed, stay well hydrated, restart at least one of her blood pressure medications and f/u with OB/GYN early next week to discuss concerns about BP medication if still having stomach upset.  Strongly encouraged to f/u with [facility] if symptoms worsening this weekend including unable to keep down fluids, high fever, vaginal bleeding, or other new concerning symptoms develop.     Junius Finner, PA-C 02/04/16 1940

## 2016-02-09 ENCOUNTER — Encounter: Payer: Self-pay | Admitting: Certified Nurse Midwife

## 2016-02-09 ENCOUNTER — Ambulatory Visit (INDEPENDENT_AMBULATORY_CARE_PROVIDER_SITE_OTHER): Payer: 59 | Admitting: Certified Nurse Midwife

## 2016-02-09 DIAGNOSIS — Z209 Contact with and (suspected) exposure to unspecified communicable disease: Secondary | ICD-10-CM

## 2016-02-09 DIAGNOSIS — IMO0002 Reserved for concepts with insufficient information to code with codable children: Secondary | ICD-10-CM

## 2016-02-09 LAB — POCT URINALYSIS DIPSTICK
Bilirubin, UA: NEGATIVE
Blood, UA: <250
Glucose, UA: NEGATIVE
Ketones, UA: NEGATIVE
NITRITE UA: NEGATIVE
PH UA: 7
Spec Grav, UA: 1.01
UROBILINOGEN UA: 0.2

## 2016-02-09 NOTE — Addendum Note (Signed)
Addended by: Lear NgMARTIN, Firmin Belisle L on: 02/09/2016 09:19 AM   Modules accepted: Orders

## 2016-02-09 NOTE — Progress Notes (Signed)
Subjective:     Connie Cobb is a 25 y.o. female who presents for a postpartum visit. She is 2 weeks postpartum following a spontaneous vaginal delivery. I have fully reviewed the prenatal and intrapartum course. The delivery was at 38.5 gestational weeks. Outcome: spontaneous vaginal delivery and with shoulder dystocia. Anesthesia: epidural. Postpartum course has been complicated by PIH. Baby's course has been normal. Baby is feeding by breast. Bleeding staining only. Bowel function is normal. Bladder function is normal. Patient is not sexually active. Contraception method is abstinence. Postpartum depression screening: negative.  Tobacco, alcohol and substance abuse history reviewed.  Adult immunizations reviewed including TDAP, rubella and varicella.  The following portions of the patient's history were reviewed and updated as appropriate: allergies, current medications, past family history, past medical history, past social history, past surgical history and problem list.  Review of Systems Pertinent items noted in HPI and remainder of comprehensive ROS otherwise negative.   Objective:    BP 119/83   Pulse 76   Temp 98 F (36.7 C) (Oral)   Wt 174 lb (78.9 kg)   LMP 05/08/2015 (Exact Date)   BMI 27.25 kg/m   General:  alert, cooperative and no distress   Breasts:  inspection negative, no nipple discharge or bleeding, no masses or nodularity palpable  Lungs: clear to auscultation bilaterally  Heart:  regular rate and rhythm, S1, S2 normal, no murmur, click, rub or gallop  Abdomen: soft, non-tender; bowel sounds normal; no masses,  no organomegaly          50% of 15 min visit spent on counseling and coordination of care.  Assessment:     Normal 2 week postpartum exam. Pap smear not done at today's visit.     Hx of LGSIL/CIN1/HPV: Colpo in 2-3 months per Dr. Clearance CootsHarper  Contraception counseling   FMLA paperwork Plan:    1. Contraception: abstinence 2.  Planning POP d/t hx of HTN,  considering IUD 3. Follow up in: 4 weeks or as needed.    4. Return to work September 25th 2hr GTT for h/o GDM/screening for DM q 3 yrs per ADA recommendations Preconception counseling provided Healthy lifestyle practices reviewed

## 2016-02-26 ENCOUNTER — Encounter: Payer: Self-pay | Admitting: *Deleted

## 2016-03-08 ENCOUNTER — Encounter: Payer: Self-pay | Admitting: *Deleted

## 2016-03-08 ENCOUNTER — Ambulatory Visit (INDEPENDENT_AMBULATORY_CARE_PROVIDER_SITE_OTHER): Payer: 59 | Admitting: Certified Nurse Midwife

## 2016-03-08 ENCOUNTER — Encounter: Payer: Self-pay | Admitting: Certified Nurse Midwife

## 2016-03-08 VITALS — BP 120/79 | HR 75 | Temp 98.3°F | Wt 174.3 lb

## 2016-03-08 DIAGNOSIS — Z30011 Encounter for initial prescription of contraceptive pills: Secondary | ICD-10-CM

## 2016-03-08 MED ORDER — NORETHINDRONE 0.35 MG PO TABS: 1.0000 | ORAL_TABLET | Freq: Every day | ORAL | 11 refills | Status: DC

## 2016-03-08 NOTE — Progress Notes (Signed)
Subjective:     Connie Cobb is a 25 y.o. female who presents for a postpartum visit. She is 6 weeks postpartum following a spontaneous vaginal delivery. I have fully reviewed the prenatal and intrapartum course. The delivery was at 38 gestational weeks. Outcome: spontaneous vaginal delivery. Anesthesia: epidural. Postpartum course has been complicated by cHTN. Baby's course has been normal. Baby is feeding by breast. Bleeding no bleeding. Bowel function is normal. Bladder function is normal. Patient is not sexually active. Contraception method is abstinence. Postpartum depression screening: negative; score of 4.  Tobacco, alcohol and substance abuse history reviewed.  Adult immunizations reviewed including TDAP, rubella and varicella.  The following portions of the patient's history were reviewed and updated as appropriate: allergies, current medications, past family history, past medical history, past social history, past surgical history and problem list.  Review of Systems Pertinent items are noted in HPI.   Objective:    BP 120/79   Pulse 75   Temp 98.3 F (36.8 C) (Oral)   Wt 174 lb 4.8 oz (79.1 kg)   LMP 05/08/2015 (Exact Date)   Breastfeeding? Yes   BMI 27.30 kg/m   General:  alert, cooperative and no distress   Breasts:  inspection negative, no nipple discharge or bleeding, no masses or nodularity palpable  Lungs: clear to auscultation bilaterally  Heart:  regular rate and rhythm, S1, S2 normal, no murmur, click, rub or gallop  Abdomen: soft, non-tender; bowel sounds normal; no masses,  no organomegaly   Vulva:  normal  Vagina: normal vagina  Cervix:  no cervical motion tenderness  Corpus: normal size, contour, position, consistency, mobility, non-tender  Adnexa:  normal adnexa  Rectal Exam: Not performed.          50% of 25 min visit spent on counseling and coordination of care.  Assessment:     Normal 6 week postpartum exam. Pap smear not done at today's visit.    Normotensive with norvasc  Plan:   Needs Colpo  Needs 2 hour OGTT  1. Contraception: oral progesterone-only contraceptive 2.  Is planning IUD later on 3. Follow up in: 6 weeks for colpo and 2 hr OGTT or as needed.  2hr GTT for h/o GDM/screening for DM q 3 yrs per ADA recommendations Preconception counseling provided Healthy lifestyle practices reviewed

## 2016-04-18 ENCOUNTER — Encounter: Payer: Self-pay | Admitting: *Deleted

## 2016-04-18 ENCOUNTER — Ambulatory Visit (INDEPENDENT_AMBULATORY_CARE_PROVIDER_SITE_OTHER): Payer: 59 | Admitting: Obstetrics

## 2016-04-18 ENCOUNTER — Encounter: Payer: Self-pay | Admitting: Obstetrics

## 2016-04-18 ENCOUNTER — Other Ambulatory Visit: Payer: Self-pay | Admitting: Obstetrics

## 2016-04-18 VITALS — BP 124/85 | HR 68 | Temp 98.3°F | Ht 67.0 in | Wt 164.2 lb

## 2016-04-18 DIAGNOSIS — R87612 Low grade squamous intraepithelial lesion on cytologic smear of cervix (LGSIL): Secondary | ICD-10-CM

## 2016-04-18 DIAGNOSIS — N870 Mild cervical dysplasia: Secondary | ICD-10-CM | POA: Diagnosis not present

## 2016-04-18 NOTE — Progress Notes (Signed)
Colposcopy Procedure Note  Indications: Pap smear 2 months ago showed: low-grade squamous intraepithelial neoplasia (LGSIL - encompassing HPV,mild dysplasia,CIN I). The prior pap showed no abnormalities.  Prior cervical/vaginal disease: normal exam without visible pathology. Prior cervical treatment: no treatment.  Procedure Details  The risks and benefits of the procedure and Written informed consent obtained.  A time-out was performed confirming the patient, procedure and allergy status  Speculum placed in vagina and excellent visualization of cervix achieved, cervix swabbed x 3 with acetic acid solution.  Findings: Cervix: HPV changes noted at 1, 5, 7 o'clock; SCJ visualized 360 degrees without lesions.   Vaginal inspection: normal without visible lesions. Vulvar colposcopy: vulvar colposcopy not performed.   Physical Exam   Specimens: cervical biopsies and ECC  Complications: none.  Plan: Specimens labelled and sent to Pathology. Will base further treatment on Pathology findings. Post biopsy instructions given to patient. Return to discuss Pathology results in 2 weeks.

## 2016-04-19 ENCOUNTER — Encounter: Payer: 59 | Admitting: Obstetrics

## 2016-05-02 ENCOUNTER — Ambulatory Visit: Payer: 59 | Admitting: Obstetrics

## 2016-09-18 ENCOUNTER — Encounter (HOSPITAL_COMMUNITY): Payer: Self-pay | Admitting: Emergency Medicine

## 2016-09-18 DIAGNOSIS — Z7982 Long term (current) use of aspirin: Secondary | ICD-10-CM | POA: Diagnosis not present

## 2016-09-18 DIAGNOSIS — R509 Fever, unspecified: Secondary | ICD-10-CM | POA: Diagnosis present

## 2016-09-18 DIAGNOSIS — Z79899 Other long term (current) drug therapy: Secondary | ICD-10-CM | POA: Diagnosis not present

## 2016-09-18 DIAGNOSIS — I10 Essential (primary) hypertension: Secondary | ICD-10-CM | POA: Insufficient documentation

## 2016-09-18 DIAGNOSIS — R51 Headache: Secondary | ICD-10-CM | POA: Insufficient documentation

## 2016-09-18 NOTE — ED Triage Notes (Signed)
Pt c/o headache, chills, fatigue, and lower back pain; headache began a week ago; pt states she developed a subjective fever today; denies N/V/D; denies urinary sx

## 2016-09-19 ENCOUNTER — Emergency Department (HOSPITAL_COMMUNITY)
Admission: EM | Admit: 2016-09-19 | Discharge: 2016-09-19 | Disposition: A | Discharge status: Home / Self Care | Payer: 59 | Attending: Emergency Medicine | Admitting: Emergency Medicine

## 2016-09-19 DIAGNOSIS — R51 Headache: Secondary | ICD-10-CM

## 2016-09-19 DIAGNOSIS — R519 Headache, unspecified: Secondary | ICD-10-CM

## 2016-09-19 DIAGNOSIS — Z79899 Other long term (current) drug therapy: Secondary | ICD-10-CM | POA: Diagnosis not present

## 2016-09-19 DIAGNOSIS — I10 Essential (primary) hypertension: Secondary | ICD-10-CM | POA: Diagnosis not present

## 2016-09-19 DIAGNOSIS — Z7982 Long term (current) use of aspirin: Secondary | ICD-10-CM | POA: Diagnosis not present

## 2016-09-19 LAB — URINALYSIS, ROUTINE W REFLEX MICROSCOPIC
BILIRUBIN URINE: NEGATIVE
Glucose, UA: NEGATIVE mg/dL
Hgb urine dipstick: NEGATIVE
KETONES UR: NEGATIVE mg/dL
LEUKOCYTES UA: NEGATIVE
NITRITE: NEGATIVE
Protein, ur: NEGATIVE mg/dL
Specific Gravity, Urine: 1.015 (ref 1.005–1.030)
pH: 6 (ref 5.0–8.0)

## 2016-09-19 LAB — POC URINE PREG, ED: PREG TEST UR: NEGATIVE

## 2016-09-19 LAB — COMPREHENSIVE METABOLIC PANEL
ALT: 19 U/L (ref 14–54)
ANION GAP: 7 (ref 5–15)
AST: 21 U/L (ref 15–41)
Albumin: 4.5 g/dL (ref 3.5–5.0)
Alkaline Phosphatase: 48 U/L (ref 38–126)
BUN: 11 mg/dL (ref 6–20)
CHLORIDE: 107 mmol/L (ref 101–111)
CO2: 23 mmol/L (ref 22–32)
CREATININE: 0.8 mg/dL (ref 0.44–1.00)
Calcium: 9.1 mg/dL (ref 8.9–10.3)
Glucose, Bld: 101 mg/dL — ABNORMAL HIGH (ref 65–99)
Potassium: 3.3 mmol/L — ABNORMAL LOW (ref 3.5–5.1)
Sodium: 137 mmol/L (ref 135–145)
Total Bilirubin: 1.1 mg/dL (ref 0.3–1.2)
Total Protein: 7.5 g/dL (ref 6.5–8.1)

## 2016-09-19 LAB — CBC
HCT: 33.5 % — ABNORMAL LOW (ref 36.0–46.0)
Hemoglobin: 11.2 g/dL — ABNORMAL LOW (ref 12.0–15.0)
MCH: 26.8 pg (ref 26.0–34.0)
MCHC: 33.4 g/dL (ref 30.0–36.0)
MCV: 80.1 fL (ref 78.0–100.0)
PLATELETS: 184 10*3/uL (ref 150–400)
RBC: 4.18 MIL/uL (ref 3.87–5.11)
RDW: 12.9 % (ref 11.5–15.5)
WBC: 6.3 10*3/uL (ref 4.0–10.5)

## 2016-09-19 MED ORDER — SODIUM CHLORIDE 0.9 % IV BOLUS (SEPSIS)
1000.0000 mL | Freq: Once | INTRAVENOUS | Status: AC
  Administered 2016-09-19: 1000 mL via INTRAVENOUS

## 2016-09-19 MED ORDER — PROCHLORPERAZINE EDISYLATE 5 MG/ML IJ SOLN
10.0000 mg | Freq: Once | INTRAMUSCULAR | Status: AC
  Administered 2016-09-19: 10 mg via INTRAVENOUS
  Filled 2016-09-19: qty 2

## 2016-09-19 MED ORDER — DIPHENHYDRAMINE HCL 50 MG/ML IJ SOLN
25.0000 mg | Freq: Once | INTRAMUSCULAR | Status: AC
  Administered 2016-09-19: 25 mg via INTRAVENOUS
  Filled 2016-09-19: qty 1

## 2016-09-19 NOTE — Discharge Instructions (Signed)
Please take Tylenol or ibuProfen as needed for headache. Caffeine also can help symptoms. Please follow up with a primary care provider regarding today's visit. I have included Engineer, building servicesfinancial resource guide. Please also follow up with your neurologist regarding today's visit. I have included our on-call neurologist information as well.   Contact a health care provider if: You develop symptoms that are different or more severe than your usual migraine symptoms. Get help right away if: Your migraine becomes severe. You have a fever. You have a stiff neck. You have vision loss. Your muscles feel weak or like you cannot control them. You start to lose your balance often. You develop trouble walking. You faint.

## 2016-09-19 NOTE — ED Provider Notes (Signed)
[facility]-EMERGENCY DEPT Provider Note   CSN: [id number] Arrival date & time: 09/18/16  2317     History   Chief Complaint Chief Complaint  Patient presents with  . Fever    HPI Connie Cobb is a 26 y.o. female with PMHx of anxiety, GERD, anemia, migraine, allergy presents with complaints of left sided headache x 1 week. She reports it has been gradually worsening, constant, stabbing, throbbing, 10/10. She reports associated fever (tmax 101), chills, fatigue, and lower back pain. She states that her migraine is similar to her previous migraines but more intense. She describes her low back pain as improving, throbbing, intermittent, 4/10 starting sunday.  She reports trying Excedrin migraine yesterday evening with no relief. She states dark and quiet makes it better, light and noisy area makes it worse  She denies aura, vomiting, diarrhea, urinary symptoms, chest pain, SOB, trauma.  She states she stopped breast feeding about 2-3 months ago. LMP 3/14 with normal flow. She states she has seen a neurologist and has been given topamax. She states she does not take it any more.  The history is provided by the patient. No language interpreter was used.  Fever   Associated symptoms include headaches. Pertinent negatives include no chest pain, no diarrhea and no vomiting.    Past Medical History:  Diagnosis Date  . Allergy   . Anemia   . GERD (gastroesophageal reflux disease)   . Migraine     Patient Active Problem List   Diagnosis Date Noted  . Hypertension in pregnancy, preeclampsia/eclampsia/prior HTN/postp cond 02/09/2016  . Normal delivery 01/27/2016  . Gestational hypertension w/o significant proteinuria in 3rd trimester 01/25/2016  . Encounter for supervision of other normal pregnancy in third trimester 01/19/2016  . Ringing in right ear 06/15/2014  . Inattention 01/23/2013  . Migraine, unspecified, without mention of intractable migraine without mention of status migrainosus  01/23/2013  . Chlamydia 12/22/2011  . GERD (gastroesophageal reflux disease) 10/29/2011  . Sinusitis 08/21/2011  . Allergic rhinitis, seasonal 08/21/2011  . UTI (lower urinary tract infection) 08/21/2011  . Screening for malignant neoplasm of the cervix 05/22/2011  . Routine gynecological examination 05/22/2011  . Family history of breast cancer 05/22/2011  . Contact with or exposure to other viral diseases(V01.79) 12/01/2010  . Anxiety 12/01/2010  . RINGWORM 08/01/2010  . VAGINITIS 08/01/2010  . ANEMIA-NOS 05/19/2010    Past Surgical History:  Procedure Laterality Date  . TONSILLECTOMY      OB History    Gravida Para Term Preterm AB Living   2 1 1   1 1    SAB TAB Ectopic Multiple Live Births         0 1       Home Medications    Prior to Admission medications   Medication Sig Start Date End Date Taking? Authorizing Provider  aspirin-acetaminophen-caffeine (EXCEDRIN MIGRAINE) [phone] MG tablet Take 1 tablet by mouth every 6 (six) hours as needed for headache.   Yes Historical Provider, MD  ibuprofen (ADVIL,MOTRIN) 200 MG tablet Take 800 mg by mouth every 6 (six) hours as needed for headache.   Yes Historical Provider, MD  norethindrone (MICRONOR,CAMILA,ERRIN) 0.35 MG tablet Take 1 tablet (0.35 mg total) by mouth daily. 03/08/16  Yes Rachelle A Denney, CNM  amLODipine (NORVASC) 10 MG tablet Take 1 tablet (10 mg total) by mouth daily. Patient not taking: Reported on 09/19/2016 02/01/16   Rachelle A Denney, CNM  ibuprofen (ADVIL,MOTRIN) 600 MG tablet Take 1 tablet (600 mg total) by  mouth every 6 (six) hours as needed. Patient not taking: Reported on 09/19/2016 01/29/16   Arabella Merles, CNM    Family History Family History  Problem Relation Age of Onset  . Diabetes Father   . Hyperlipidemia Father   . Hypertension Father   . Hypertension Maternal Grandmother   . Cancer Maternal Grandfather   . Heart disease Maternal Grandfather   . Hypertension Maternal Grandfather   .  Cancer Paternal Grandmother   . Diabetes      grandparent  . Breast cancer      grandmother    Social History Social History  Substance Use Topics  . Smoking status: Never Smoker  . Smokeless tobacco: Never Used  . Alcohol use No     Allergies   Banana and Sulfonamide derivatives   Review of Systems Review of Systems  Constitutional: Positive for chills and fever.  Eyes: Positive for photophobia.  Respiratory: Negative for shortness of breath.   Cardiovascular: Negative for chest pain.  Gastrointestinal: Positive for nausea. Negative for abdominal pain, diarrhea and vomiting.  Genitourinary: Negative for difficulty urinating and dysuria.  Musculoskeletal: Negative for neck pain and neck stiffness.  Skin: Negative for wound.  Neurological: Positive for headaches. Negative for numbness.  All other systems reviewed and are negative.    Physical Exam Updated Vital Signs BP (!) 106/48 (BP Location: Left Arm)   Pulse 86   Temp 98.7 F (37.1 C) (Oral)   Resp 16   LMP 09/13/2016 (Exact Date)   SpO2 100%   Breastfeeding? No   Physical Exam  Constitutional: She is oriented to person, place, and time. She appears well-developed and well-nourished.  Appears uncomfortable and sensitive to light.   HENT:  Head: Normocephalic and atraumatic.  Nose: Nose normal.  Mouth/Throat: Oropharynx is clear and moist.  No pain at temporal region.   Eyes: Conjunctivae and EOM are normal.  Neck: Normal range of motion. Neck supple.  Normal ROM of neck. No nuchal rigidity. No tenderness to neck.  Cardiovascular: Normal rate, normal heart sounds and intact distal pulses.   Pulmonary/Chest: Effort normal and breath sounds normal. No respiratory distress. She has no wheezes. She has no rales.  Normal work of breathing. No respiratory distress noted.   Abdominal: Soft. There is no tenderness. There is no rebound and no guarding.  Musculoskeletal: Normal range of motion.  Neurological: She  is alert and oriented to person, place, and time.  Cranial Nerves:  III,IV, VI: ptosis not present, extra-ocular movements intact bilaterally, direct and consensual pupillary light reflexes intact bilaterally V: facial sensation, jaw opening, and bite strength equal bilaterally VII: eyebrow raise, eyelid close, smile, frown, pucker equal bilaterally VIII: hearing grossly normal bilaterally  IX,X: palate elevation and swallowing intact XI: bilateral shoulder shrug and lateral head rotation equal and strong XII: midline tongue extension  Negative pronator drift, negative Romberg, negative RAM's, negative heel-to-shin, negative finger to nose.    Sensory intact.  Muscle strength 5/5 Patient able to ambulate without difficulty.   Negative brudzinki sign. Negative Kernig sign  Skin: Skin is warm. No rash noted.  Psychiatric: She has a normal mood and affect. Her behavior is normal.  Nursing note and vitals reviewed.    ED Treatments / Results  Labs (all labs ordered are listed, but only abnormal results are displayed) Labs Reviewed  COMPREHENSIVE METABOLIC PANEL - Abnormal; Notable for the following:       Result Value   Potassium 3.3 (*)  Glucose, Bld 101 (*)    All other components within normal limits  CBC - Abnormal; Notable for the following:    Hemoglobin 11.2 (*)    HCT 33.5 (*)    All other components within normal limits  URINALYSIS, ROUTINE W REFLEX MICROSCOPIC  POC URINE PREG, ED    EKG  EKG Interpretation None       Radiology No results found.  Procedures Procedures (including critical care time)  Medications Ordered in ED Medications  sodium chloride 0.9 % bolus 1,000 mL (1,000 mLs Intravenous New Bag/Given 09/19/16 0806)  prochlorperazine (COMPAZINE) injection 10 mg (10 mg Intravenous Given 09/19/16 0806)  diphenhydrAMINE (BENADRYL) injection 25 mg (25 mg Intravenous Given 09/19/16 0806)     Initial Impression / Assessment and Plan / ED Course  I  have reviewed the triage vital signs and the nursing notes.  Pertinent labs & imaging results that were available during my care of the patient were reviewed by me and considered in my medical decision making (see chart for details).     Pt HA treated and improved significantly while in ED.  Presentation is like pts typical HA and non concerning [facility], ICH, Meningitis, or temporal arteritis. Pt is afebrile here with no focal neuro deficits, nuchal rigidity, or change in vision. Pt is to follow up with PCP to discuss prophylactic medication. Pt in NAD, hemodynamically stable, afebrile. Pt verbalizes understanding and is agreeable with plan to dc.    Final Clinical Impressions(s) / ED Diagnoses   Final diagnoses:  Acute nonintractable headache, unspecified headache type    New Prescriptions New Prescriptions   No medications on file     [address]Ziyah Cordoba Manuel [place], [place] 09/19/16 [id number]    Benjiman CoreNathan Pickering, MD 09/19/16 [phone]

## 2016-10-18 ENCOUNTER — Ambulatory Visit (INDEPENDENT_AMBULATORY_CARE_PROVIDER_SITE_OTHER): Payer: 59 | Admitting: Family Medicine

## 2016-10-18 ENCOUNTER — Encounter: Payer: Self-pay | Admitting: Family Medicine

## 2016-10-18 VITALS — BP 129/86 | HR 73 | Resp 16 | Ht 67.0 in | Wt 142.8 lb

## 2016-10-18 DIAGNOSIS — J301 Allergic rhinitis due to pollen: Secondary | ICD-10-CM | POA: Diagnosis not present

## 2016-10-18 DIAGNOSIS — Z0000 Encounter for general adult medical examination without abnormal findings: Secondary | ICD-10-CM | POA: Diagnosis not present

## 2016-10-18 DIAGNOSIS — F411 Generalized anxiety disorder: Secondary | ICD-10-CM

## 2016-10-18 DIAGNOSIS — D508 Other iron deficiency anemias: Secondary | ICD-10-CM | POA: Diagnosis not present

## 2016-10-18 MED ORDER — FLUTICASONE PROPIONATE 50 MCG/ACT NA SUSP: 2.0000 | Freq: Every day | NASAL | 6 refills | Status: DC

## 2016-10-18 NOTE — Patient Instructions (Addendum)
Slow Fe once daily for anemia  Please check on UMR website for specialist for cognitive assessment  Avoid dextromorphan  You can try allegra or claritin or zyrtec   IF you received an x-ray today, you will receive an invoice from [facility]. Please contact [facility] at [phone] with questions or concerns regarding your invoice.   IF you received labwork today, you will receive an invoice from [place] [facility]. Please contact [facility] at [phone] with questions or concerns regarding your invoice.   Our billing staff will not be able to assist you with questions regarding bills from these companies.  You will be contacted with the lab results as soon as they are available. The fastest way to get your results is to activate your My Chart account. Instructions are located on the last page of this paperwork. If you have not heard from [place] regarding the results in 2 weeks, please contact this office.      Generalized Anxiety Disorder, Adult Generalized anxiety disorder (GAD) is a mental health disorder. People with this condition constantly worry about everyday events. Unlike normal anxiety, worry related to GAD is not triggered by a specific event. These worries also do not fade or get better with time. GAD interferes with life functions, including relationships, work, and school. GAD can vary from mild to severe. People with severe GAD can have intense waves of anxiety with physical symptoms (panic attacks). What are the causes? The exact cause of GAD is not known. What increases the risk? This condition is more likely to develop in:  Women.  People who have a family history of anxiety disorders.  People who are very shy.  People who experience very stressful life events, such as the death of a loved one.  People who have a very stressful family environment. What are the signs or symptoms? People with GAD often worry excessively about many things in their lives,  such as their health and family. They may also be overly concerned about:  Doing well at work.  Being on time.  Natural disasters.  Friendships. Physical symptoms of GAD include:  Fatigue.  Muscle tension or having muscle twitches.  Trembling or feeling shaky.  Being easily startled.  Feeling like your heart is pounding or racing.  Feeling out of breath or like you cannot take a deep breath.  Having trouble falling asleep or staying asleep.  Sweating.  Nausea, diarrhea, or irritable bowel syndrome (IBS).  Headaches.  Trouble concentrating or remembering facts.  Restlessness.  Irritability. How is this diagnosed? Your health care provider can diagnose GAD based on your symptoms and medical history. You will also have a physical exam. The health care provider will ask specific questions about your symptoms, including how severe they are, when they started, and if they come and go. Your health care provider may ask you about your use of alcohol or drugs, including prescription medicines. Your health care provider may refer you to a mental health specialist for further evaluation. Your health care provider will do a thorough examination and may perform additional tests to rule out other possible causes of your symptoms. To be diagnosed with GAD, a person must have anxiety that:  Is out of his or her control.  Affects several different aspects of his or her life, such as work and relationships.  Causes distress that makes him or her unable to take part in normal activities.  Includes at least three physical symptoms of GAD, such as restlessness, fatigue, trouble concentrating,  irritability, muscle tension, or sleep problems. Before your health care provider can confirm a diagnosis of GAD, these symptoms must be present more days than they are not, and they must last for six months or longer. How is this treated? The following therapies are usually used to treat  GAD:  Medicine. Antidepressant medicine is usually prescribed for long-term daily control. Antianxiety medicines may be added in severe cases, especially when panic attacks occur.  Talk therapy (psychotherapy). Certain types of talk therapy can be helpful in treating GAD by providing support, education, and guidance. Options include:  Cognitive behavioral therapy (CBT). People learn coping skills and techniques to ease their anxiety. They learn to identify unrealistic or negative thoughts and behaviors and to replace them with positive ones.  Acceptance and commitment therapy (ACT). This treatment teaches people how to be mindful as a way to cope with unwanted thoughts and feelings.  Biofeedback. This process trains you to manage your body's response (physiological response) through breathing techniques and relaxation methods. You will work with a therapist while machines are used to monitor your physical symptoms.  Stress management techniques. These include yoga, meditation, and exercise. A mental health specialist can help determine which treatment is best for you. Some people see improvement with one type of therapy. However, other people require a combination of therapies. Follow these instructions at home:  Take over-the-counter and prescription medicines only as told by your health care provider.  Try to maintain a normal routine.  Try to anticipate stressful situations and allow extra time to manage them.  Practice any stress management or self-calming techniques as taught by your health care provider.  Do not punish yourself for setbacks or for not making progress.  Try to recognize your accomplishments, even if they are small.  Keep all follow-up visits as told by your health care provider. This is important. Contact a health care provider if:  Your symptoms do not get better.  Your symptoms get worse.  You have signs of depression, such as:  A persistently sad, cranky,  or irritable mood.  Loss of enjoyment in activities that used to bring you joy.  Change in weight or eating.  Changes in sleeping habits.  Avoiding friends or family members.  Loss of energy for normal tasks.  Feelings of guilt or worthlessness. Get help right away if:  You have serious thoughts about hurting yourself or others. If you ever feel like you may hurt yourself or others, or have thoughts about taking your own life, get help right away. You can go to your nearest emergency department or call:  Your local emergency services (911 in the [place]  A suicide crisis helpline, such as the National Suicide Prevention Lifeline at 503-799-9312. This is open 24 hours a day. Summary  Generalized anxiety disorder (GAD) is a mental health disorder that involves worry that is not triggered by a specific event.  People with GAD often worry excessively about many things in their lives, such as their health and family.  GAD may cause physical symptoms such as restlessness, trouble concentrating, sleep problems, frequent sweating, nausea, diarrhea, headaches, and trembling or muscle twitching.  A mental health specialist can help determine which treatment is best for you. Some people see improvement with one type of therapy. However, other people require a combination of therapies. This information is not intended to replace advice given to you by your health care provider. Make sure you discuss any questions you have with your health care provider. Document  Released: 10/14/2012 Document Revised: 05/09/2016 Document Reviewed: 05/09/2016 Elsevier Interactive Patient Education  2017 [employer].

## 2016-10-18 NOTE — Progress Notes (Deleted)
  No chief complaint on file.   HPI  Past Medical History:  Diagnosis Date  . Allergy   . Anemia   . GERD (gastroesophageal reflux disease)   . Migraine     Current Outpatient Prescriptions  Medication Sig Dispense Refill  . amLODipine (NORVASC) 10 MG tablet Take 1 tablet (10 mg total) by mouth daily. (Patient not taking: Reported on 09/19/2016) 30 tablet 3  . aspirin-acetaminophen-caffeine (EXCEDRIN MIGRAINE) 250-250-65 MG tablet Take 1 tablet by mouth every 6 (six) hours as needed for headache.    . ibuprofen (ADVIL,MOTRIN) 200 MG tablet Take 800 mg by mouth every 6 (six) hours as needed for headache.    . ibuprofen (ADVIL,MOTRIN) 600 MG tablet Take 1 tablet (600 mg total) by mouth every 6 (six) hours as needed. (Patient not taking: Reported on 09/19/2016) 30 tablet 0  . norethindrone (MICRONOR,CAMILA,ERRIN) 0.35 MG tablet Take 1 tablet (0.35 mg total) by mouth daily. 1 Package 11   No current facility-administered medications for this visit.     Allergies:  Allergies  Allergen Reactions  . Banana Anaphylaxis  . Sulfonamide Derivatives Hives and Shortness Of Breath    Past Surgical History:  Procedure Laterality Date  . TONSILLECTOMY      Social History   Social History  . Marital status: Single    Spouse name: N/A  . Number of children: N/A  . Years of education: N/A   Social History Main Topics  . Smoking status: Never Smoker  . Smokeless tobacco: Never Used  . Alcohol use No  . Drug use: No  . Sexual activity: Not Currently    Birth control/ protection: Abstinence   Other Topics Concern  . Not on file   Social History Narrative  . No narrative on file    ROS  Objective: There were no vitals filed for this visit.  Physical Exam  Assessment and Plan There are no diagnoses linked to this encounter.   Kasee Hantz A Jacqulyne Gladue

## 2016-10-18 NOTE — Progress Notes (Signed)
Chief Complaint  Patient presents with  . Annual Exam    already had a pap  . Sinus Problem  . Anxiety Meds    Subjective:  Connie Cobb is a 26 y.o. female here for a health maintenance visit.  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   Allergic rhinitis and sinus headache She started zyrtec for sneezing and itchy eyes She reports that she does not have congestion and runny nose No fevers or chills She denies rashes Reports sinus pressure and increase in her migraines   Patient Active Problem List   Diagnosis Date Noted  . Hypertension in pregnancy, preeclampsia/eclampsia/prior HTN/postp cond 02/09/2016  . Normal delivery 01/27/2016  . Gestational hypertension w/o significant proteinuria in 3rd trimester 01/25/2016  . Encounter for supervision of other normal pregnancy in third trimester 01/19/2016  . Ringing in right ear 06/15/2014  . Inattention 01/23/2013  . Migraine, unspecified, without mention of intractable migraine without mention of status migrainosus 01/23/2013  . Chlamydia 12/22/2011  . GERD (gastroesophageal reflux disease) 10/29/2011  . Sinusitis 08/21/2011  . Allergic rhinitis, seasonal 08/21/2011  . UTI (lower urinary tract infection) 08/21/2011  . Screening for malignant neoplasm of the cervix 05/22/2011  . Routine gynecological examination 05/22/2011  . Family history of breast cancer 05/22/2011  . Contact with or exposure to other viral diseases(V01.79) 12/01/2010  . Anxiety 12/01/2010  . RINGWORM 08/01/2010  . VAGINITIS 08/01/2010  . ANEMIA-NOS 05/19/2010    Past Medical History:  Diagnosis Date  . Allergy   . Anemia   . GERD (gastroesophageal  reflux disease)   . Migraine     Past Surgical History:  Procedure Laterality Date  . TONSILLECTOMY       Outpatient Medications Prior to Visit  Medication Sig Dispense Refill  . aspirin-acetaminophen-caffeine (EXCEDRIN MIGRAINE) 250-250-65 MG tablet Take 1 tablet by mouth every 6 (six) hours as needed for headache.    . ibuprofen (ADVIL,MOTRIN) 200 MG tablet Take 800 mg by mouth every 6 (six) hours as needed for headache.    . ibuprofen (ADVIL,MOTRIN) 600 MG tablet Take 1 tablet (600 mg total) by mouth every 6 (six) hours as needed. 30 tablet 0  . norethindrone (MICRONOR,CAMILA,ERRIN) 0.35 MG tablet Take 1 tablet (0.35 mg total) by mouth daily. 1 Package 11  . amLODipine (NORVASC) 10 MG tablet Take 1 tablet (10 mg total) by mouth daily. (Patient not taking: Reported on 09/19/2016) 30 tablet 3   No facility-administered medications prior to visit.     Allergies  Allergen Reactions  . Banana Anaphylaxis  . Sulfonamide Derivatives Hives and Shortness Of Breath     Family History  Problem Relation Age of Onset  . Diabetes Father   . Hyperlipidemia Father   . Hypertension Father   . Hypertension Maternal Grandmother   . Cancer Maternal Grandfather   . Heart disease Maternal Grandfather   . Hypertension Maternal Grandfather   . Cancer Paternal Grandmother   . Diabetes      grandparent  . Breast cancer      grandmother     Health Habits: Dental Exam: up to date Eye Exam: not up to date   Social History   Social  History  . Marital status: Single    Spouse name: N/A  . Number of children: N/A  . Years of education: N/A   Occupational History  . Not on file.   Social History Main Topics  . Smoking status: Never Smoker  . Smokeless tobacco: Never Used  . Alcohol use No  . Drug use: No  . Sexual activity: Not Currently    Birth control/ protection: Abstinence   Other Topics Concern  . Not on file   Social History Narrative  . No narrative on file    History  Alcohol Use No   History  Smoking Status  . Never Smoker  Smokeless Tobacco  . Never Used   History  Drug Use No    GYN: Sexual Health Menstrual status: regular menses LMP: No LMP recorded. Last pap smear: see HM section History of abnormal pap smears:  Sexually active: with female partner Current contraception: OCP  Health Maintenance: See under health Maintenance activity for review of completion dates as well. Immunization History  Administered Date(s) Administered  . Influenza Split 03/27/2011  . Tdap 01/28/2016      Depression Screen-PHQ2/9 Depression screen [facility] 2/9 10/18/2016 12/08/2014 06/15/2014  Decreased Interest 0 0 0  Down, Depressed, Hopeless 0 0 0  PHQ - 2 Score 0 0 0      Depression Severity and Treatment Recommendations:  0-4= None  5-9= Mild / Treatment: Support, educate to call if worse; return in one month  10-14= Moderate / Treatment: Support, watchful waiting; Antidepressant or Psycotherapy  15-19= Moderately severe / Treatment: Antidepressant OR Psychotherapy  >= 20 = Major depression, severe / Antidepressant AND Psychotherapy    Review of Systems   Review of Systems  Constitutional: Negative for chills, fever and weight loss.  HENT:       See hpi  Eyes: Negative for blurred vision and double vision.  Respiratory: Negative for cough, hemoptysis and sputum production.   Cardiovascular: Negative for chest pain and palpitations.  Gastrointestinal: Negative for abdominal pain, heartburn, nausea and vomiting.  Genitourinary: Negative for dysuria, frequency and urgency.  Musculoskeletal: Negative for back pain, myalgias and neck pain.  Skin: Negative for itching and rash.  Neurological: Negative for dizziness, tingling and headaches.  Psychiatric/Behavioral: Negative for depression. The patient is nervous/anxious. The patient does not have insomnia.     See HPI for ROS as well.    Objective:   Vitals:   10/18/16 1259  BP:  129/86  Pulse: 73  Resp: 16  SpO2: 96%  Weight: 142 lb 12.8 oz (64.8 kg)  Height:  (1.702 m)    Body mass index is 22.37 kg/m.  Physical Exam  Constitutional: She is oriented to person, place, and time. She appears well-developed and well-nourished.  HENT:  Head: Normocephalic and atraumatic.  Right Ear: External ear normal.  Left Ear: External ear normal.  Nose: Nose normal.  Mouth/Throat: Oropharynx is clear and moist.  Eyes: Conjunctivae and EOM are normal. Pupils are equal, round, and reactive to light. Right eye exhibits no discharge. Left eye exhibits no discharge. No scleral icterus.  Neck: Normal range of motion. Neck supple. No thyromegaly present.  Cardiovascular: Normal rate, regular rhythm and normal heart sounds.   No murmur heard. Pulmonary/Chest: Effort normal and breath sounds normal. No respiratory distress. She has no wheezes. She has no rales.  Abdominal: Soft. Bowel sounds are normal. She exhibits no distension. There is no tenderness. There is no rebound and no guarding.  Musculoskeletal: Normal range of motion. She exhibits no edema or tenderness.  Neurological: She is alert and oriented to person, place, and time. She has normal reflexes. No cranial nerve deficit.  Skin: No erythema.  Psychiatric: She has a normal mood and affect. Her behavior is normal. Judgment and thought content normal.       Assessment/Plan:   Patient was seen for a health maintenance exam.  Counseled the patient on health maintenance issues. Reviewed her health mainteance schedule and ordered appropriate tests (see orders.) Counseled on regular exercise and weight management. Recommend regular eye exams and dental cleaning.   The following issues were addressed today for health maintenance:   Jamielynn was seen today for annual exam, sinus problem and anxiety meds.  Diagnoses and all orders for this visit:  Encounter for health maintenance examination- updated med list,  reviewed age appropriate screenings  Iron deficiency anemia secondary to inadequate dietary iron intake- advised slow Fe daily  GAD (generalized anxiety disorder)- advised to call her insurance [employer] to see which groups are covered  Seasonal allergic rhinitis due to pollen Continue antihistamine but add on flonase -     fluticasone (FLONASE) 50 MCG/ACT nasal spray; Place 2 sprays into both nostrils daily.    No Follow-up on file.    Body mass index is 22.37 kg/m.:  Discussed the patient's BMI with patient. The BMI body mass index is 22.37 kg/m.     No future appointments.  Patient Instructions    Slow Fe once daily for anemia  Please check on [employer] website for specialist for cognitive assessment  Avoid dextromorphan  You can try allegra or claritin or zyrtec   IF you received an x-ray today, you will receive an invoice from [facility]. Please contact [facility] at [phone] with questions or concerns regarding your invoice.   IF you received labwork today, you will receive an invoice from [facility]. Please contact [facility] at [phone] with questions or concerns regarding your invoice.   Our billing staff will not be able to assist you with questions regarding bills from these companies.  You will be contacted with the lab results as soon as they are available. The fastest way to get your results is to activate your My Chart account. Instructions are located on the last page of this paperwork. If you have not heard from [place] regarding the results in 2 weeks, please contact this office.      Generalized Anxiety Disorder, Adult Generalized anxiety disorder (GAD) is a mental health disorder. People with this condition constantly worry about everyday events. Unlike normal anxiety, worry related to GAD is not triggered by a specific event. These worries also do not fade or get better with time. GAD interferes with life functions, including relationships,  work, and school. GAD can vary from mild to severe. People with severe GAD can have intense waves of anxiety with physical symptoms (panic attacks). What are the causes? The exact cause of GAD is not known. What increases the risk? This condition is more likely to develop in:  Women.  People who have a family history of anxiety disorders.  People who are very shy.  People who experience very stressful life events, such as the death of a loved one.  People who have a very stressful family environment. What are the signs or symptoms? People with GAD often worry excessively about many things in their lives, such as their health and family. They may also be overly concerned about:  Doing  well at work.  Being on time.  Natural disasters.  Friendships. Physical symptoms of GAD include:  Fatigue.  Muscle tension or having muscle twitches.  Trembling or feeling shaky.  Being easily startled.  Feeling like your heart is pounding or racing.  Feeling out of breath or like you cannot take a deep breath.  Having trouble falling asleep or staying asleep.  Sweating.  Nausea, diarrhea, or irritable bowel syndrome (IBS).  Headaches.  Trouble concentrating or remembering facts.  Restlessness.  Irritability. How is this diagnosed? Your health care provider can diagnose GAD based on your symptoms and medical history. You will also have a physical exam. The health care provider will ask specific questions about your symptoms, including how severe they are, when they started, and if they come and go. Your health care provider may ask you about your use of alcohol or drugs, including prescription medicines. Your health care provider may refer you to a mental health specialist for further evaluation. Your health care provider will do a thorough examination and may perform additional tests to rule out other possible causes of your symptoms. To be diagnosed with GAD, a person must have  anxiety that:  Is out of his or her control.  Affects several different aspects of his or her life, such as work and relationships.  Causes distress that makes him or her unable to take part in normal activities.  Includes at least three physical symptoms of GAD, such as restlessness, fatigue, trouble concentrating, irritability, muscle tension, or sleep problems. Before your health care provider can confirm a diagnosis of GAD, these symptoms must be present more days than they are not, and they must last for six months or longer. How is this treated? The following therapies are usually used to treat GAD:  Medicine. Antidepressant medicine is usually prescribed for long-term daily control. Antianxiety medicines may be added in severe cases, especially when panic attacks occur.  Talk therapy (psychotherapy). Certain types of talk therapy can be helpful in treating GAD by providing support, education, and guidance. Options include:  Cognitive behavioral therapy (CBT). People learn coping skills and techniques to ease their anxiety. They learn to identify unrealistic or negative thoughts and behaviors and to replace them with positive ones.  Acceptance and commitment therapy (ACT). This treatment teaches people how to be mindful as a way to cope with unwanted thoughts and feelings.  Biofeedback. This process trains you to manage your body's response (physiological response) through breathing techniques and relaxation methods. You will work with a therapist while machines are used to monitor your physical symptoms.  Stress management techniques. These include yoga, meditation, and exercise. A mental health specialist can help determine which treatment is best for you. Some people see improvement with one type of therapy. However, other people require a combination of therapies. Follow these instructions at home:  Take over-the-counter and prescription medicines only as told by your health care  provider.  Try to maintain a normal routine.  Try to anticipate stressful situations and allow extra time to manage them.  Practice any stress management or self-calming techniques as taught by your health care provider.  Do not punish yourself for setbacks or for not making progress.  Try to recognize your accomplishments, even if they are small.  Keep all follow-up visits as told by your health care provider. This is important. Contact a health care provider if:  Your symptoms do not get better.  Your symptoms get worse.  You have signs of  depression, such as:  A persistently sad, cranky, or irritable mood.  Loss of enjoyment in activities that used to bring you joy.  Change in weight or eating.  Changes in sleeping habits.  Avoiding friends or family members.  Loss of energy for normal tasks.  Feelings of guilt or worthlessness. Get help right away if:  You have serious thoughts about hurting yourself or others. If you ever feel like you may hurt yourself or others, or have thoughts about taking your own life, get help right away. You can go to your nearest emergency department or call:  Your local emergency services (911 in the [place]  A suicide crisis helpline, such as the National Suicide Prevention Lifeline at (667)441-2150. This is open 24 hours a day. Summary  Generalized anxiety disorder (GAD) is a mental health disorder that involves worry that is not triggered by a specific event.  People with GAD often worry excessively about many things in their lives, such as their health and family.  GAD may cause physical symptoms such as restlessness, trouble concentrating, sleep problems, frequent sweating, nausea, diarrhea, headaches, and trembling or muscle twitching.  A mental health specialist can help determine which treatment is best for you. Some people see improvement with one type of therapy. However, other people require a combination of therapies. This  information is not intended to replace advice given to you by your health care provider. Make sure you discuss any questions you have with your health care provider. Document Released: 10/14/2012 Document Revised: 05/09/2016 Document Reviewed: 05/09/2016 Elsevier Interactive Patient Education  2017 [employer].

## 2016-11-15 DIAGNOSIS — F4322 Adjustment disorder with anxiety: Secondary | ICD-10-CM | POA: Diagnosis not present

## 2016-11-24 DIAGNOSIS — F4322 Adjustment disorder with anxiety: Secondary | ICD-10-CM | POA: Diagnosis not present

## 2016-12-07 DIAGNOSIS — F4322 Adjustment disorder with anxiety: Secondary | ICD-10-CM | POA: Diagnosis not present

## 2016-12-15 ENCOUNTER — Ambulatory Visit (INDEPENDENT_AMBULATORY_CARE_PROVIDER_SITE_OTHER): Payer: 59 | Admitting: Family Medicine

## 2016-12-15 ENCOUNTER — Encounter: Payer: Self-pay | Admitting: Family Medicine

## 2016-12-15 VITALS — BP 119/71 | HR 93 | Temp 98.8°F | Resp 16 | Ht 67.0 in | Wt 144.0 lb

## 2016-12-15 DIAGNOSIS — F900 Attention-deficit hyperactivity disorder, predominantly inattentive type: Secondary | ICD-10-CM

## 2016-12-15 DIAGNOSIS — F4322 Adjustment disorder with anxiety: Secondary | ICD-10-CM

## 2016-12-15 MED ORDER — AMPHETAMINE-DEXTROAMPHETAMINE 5 MG PO TABS: 5.0000 mg | ORAL_TABLET | Freq: Every day | ORAL | 0 refills | Status: DC

## 2016-12-15 NOTE — Patient Instructions (Addendum)
IF you received an x-ray today, you will receive an invoice from [facility]. Please contact [facility] at [phone] with questions or concerns regarding your invoice.   IF you received labwork today, you will receive an invoice from [place]. Please contact [facility] at [phone] with questions or concerns regarding your invoice.   Our billing staff will not be able to assist you with questions regarding bills from these companies.  You will be contacted with the lab results as soon as they are available. The fastest way to get your results is to activate your My Chart account. Instructions are located on the last page of this paperwork. If you have not heard from [place] regarding the results in 2 weeks, please contact this office.    Amphetamine; Dextroamphetamine tablets What is this medicine? AMPHETAMINE; DEXTROAMPHETAMINE(am FET a meen; dex troe am FET a meen) is used to treat attention-deficit hyperactivity disorder (ADHD). It may also be used for narcolepsy. Federal law prohibits giving this medicine to any person other than the person for whom it was prescribed. Do not share this medicine with anyone else. This medicine may be used for other purposes; ask your health care provider or pharmacist if you have questions. COMMON BRAND NAME(S): Adderall What should I tell my health care provider before I take this medicine? They need to know if you have any of these conditions: -anxiety or panic attacks -circulation problems in fingers and toes -glaucoma -hardening or blockages of the arteries or heart blood vessels -heart disease or a heart defect -high blood pressure -history of a drug or alcohol abuse problem -history of stroke -kidney disease -liver disease -mental illness -seizures -suicidal thoughts, plans, or attempt; a previous suicide attempt by you or a family member -thyroid disease -Tourette's syndrome -an unusual or allergic reaction to  dextroamphetamine, other amphetamines, other medicines, foods, dyes, or preservatives -pregnant or trying to get pregnant -breast-feeding How should I use this medicine? Take this medicine by mouth with a glass of water. Follow the directions on the prescription label. Take your doses at regular intervals. Do not take your medicine more often than directed. Do not suddenly stop your medicine. You must gradually reduce the dose or you may feel withdrawal effects. Ask your doctor or health care professional for advice. Talk to your pediatrician regarding the use of this medicine in children. Special care may be needed. While this drug may be prescribed for children as young as 3 years for selected conditions, precautions do apply. Overdosage: If you think you have taken too much of this medicine contact a poison control center or emergency room at once. NOTE: This medicine is only for you. Do not share this medicine with others. What if I miss a dose? If you miss a dose, take it as soon as you can. If it is almost time for your next dose, take only that dose. Do not take double or extra doses. What may interact with this medicine? Do not take this medicine with any of the following medications: -MAOIS like Carbex, Eldepryl, Marplan, Nardil, and Parnate -other stimulant medicines for attention disorders, weight loss, or to stay awake This medicine may also interact with the following medications: -acetazolamide -ammonium chloride -antacids -ascorbic acid -atomoxetine -caffeine -certain medicines for blood pressure -certain medicines for depression, anxiety, or psychotic disturbances -certain medicines for seizures like carbamazepine, phenobarbital, phenytoin -certain medicines for stomach problems like cimetidine, famotidine, omeprazole, lansoprazole -cold or allergy medicines -glutamic acid -lithium -meperidine -methenamine; sodium  acid phosphate -narcotic medicines for  pain -norepinephrine -phenothiazines like chlorpromazine, mesoridazine, prochlorperazine, thioridazine -sodium acid phosphate -sodium bicarbonate This list may not describe all possible interactions. Give your health care provider a list of all the medicines, herbs, non-prescription drugs, or dietary supplements you use. Also tell them if you smoke, drink alcohol, or use illegal drugs. Some items may interact with your medicine. What should I watch for while using this medicine? Visit your doctor or health care professional for regular checks on your progress. This prescription requires that you follow special procedures with your doctor and pharmacy. You will need to have a new written prescription from your doctor every time you need a refill. This medicine may affect your concentration, or hide signs of tiredness. Until you know how this medicine affects you, do not drive, ride a bicycle, use machinery, or do anything that needs mental alertness. Tell your doctor or health care professional if this medicine loses its effects, or if you feel you need to take more than the prescribed amount. Do not change the dosage without talking to your doctor or health care professional. Decreased appetite is a common side effect when starting this medicine. Eating small, frequent meals or snacks can help. Talk to your doctor if you continue to have poor eating habits. Height and weight growth of a child taking this medicine will be monitored closely. Do not take this medicine close to bedtime. It may prevent you from sleeping. If you are going to need surgery, a MRI, CT scan, or other procedure, tell your doctor that you are taking this medicine. You may need to stop taking this medicine before the procedure. Tell your doctor or healthcare professional right away if you notice unexplained wounds on your fingers and toes while taking this medicine. You should also tell your healthcare provider if you experience  numbness or pain, changes in the skin color, or sensitivity to temperature in your fingers or toes. What side effects may I notice from receiving this medicine? Side effects that you should report to your doctor or health care professional as soon as possible: -allergic reactions like skin rash, itching or hives, swelling of the face, lips, or tongue -changes in vision -chest pain or chest tightness -confusion, trouble speaking or understanding -fast, irregular heartbeat -fingers or toes feel numb, cool, painful -hallucination, loss of contact with reality -high blood pressure -males: prolonged or painful erection -seizures -severe headaches -shortness of breath -suicidal thoughts or other mood changes -trouble walking, dizziness, loss of balance or coordination -uncontrollable head, mouth, neck, arm, or leg movements Side effects that usually do not require medical attention (report to your doctor or health care professional if they continue or are bothersome): -anxious -headache -loss of appetite -nausea, vomiting -trouble sleeping -weight loss This list may not describe all possible side effects. Call your doctor for medical advice about side effects. You may report side effects to FDA at 1-800-FDA-1088. Where should I keep my medicine? Keep out of the reach of children. This medicine can be abused. Keep your medicine in a safe place to protect it from theft. Do not share this medicine with anyone. Selling or giving away this medicine is dangerous and against the law. Store at room temperature between 15 and 30 degrees C (59 and 86 degrees F). Keep container tightly closed. Throw away any unused medicine after the expiration date. Dispose of properly. This medicine may cause accidental overdose and death if it is taken by other adults, children, or  pets. Mix any unused medicine with a substance like cat litter or coffee grounds. Then throw the medicine away in a sealed container like a  sealed bag or a coffee can with a lid. Do not use the medicine after the expiration date. NOTE: This sheet is a summary. It may not cover all possible information. If you have questions about this medicine, talk to your doctor, pharmacist, or health care provider.  2018 Elsevier/Gold Standard (2014-04-22 18:44:41)

## 2016-12-15 NOTE — Progress Notes (Signed)
Chief Complaint  Patient presents with  . Headache    "awful", not getting any better  . 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   Past Medical History:  Diagnosis Date  . Allergy   . Anemia   . GERD (gastroesophageal reflux disease)   . Migraine     Current Outpatient Prescriptions  Medication Sig Dispense Refill  . aspirin-acetaminophen-caffeine (EXCEDRIN MIGRAINE) 250-250-65 MG tablet Take 1 tablet by mouth every 6 (six) hours as needed for headache.    . fluticasone (FLONASE) 50 MCG/ACT nasal spray Place 2 sprays into both nostrils daily. 16 g 6  . norethindrone (MICRONOR,CAMILA,ERRIN) 0.35 MG tablet Take 1 tablet (0.35 mg total) by mouth daily. 1 Package 11  . amphetamine-dextroamphetamine (ADDERALL) 5 MG tablet Take 1 tablet (5 mg total) by mouth daily. 30 tablet 0  . ibuprofen (ADVIL,MOTRIN) 200 MG tablet Take 800 mg by mouth every 6 (six) hours as needed for headache.     No current facility-administered medications for this visit.     Allergies:  Allergies  Allergen Reactions  . Banana Anaphylaxis  . Sulfonamide Derivatives Hives and Shortness Of Breath    Past Surgical History:  Procedure Laterality Date  . TONSILLECTOMY      Social History   Social History  . Marital status: Single    Spouse name: N/A  . Number of children: N/A  . Years of education: N/A   Social History Main Topics  . Smoking status: Never Smoker  . Smokeless tobacco: Never Used  . Alcohol use No  . Drug use: No  . Sexual activity:  Not Currently    Birth control/ protection: Abstinence   Other Topics Concern  . None   Social History Narrative  . None    Review of Systems  Constitutional: Negative for chills and fever.  Respiratory: Negative for cough and wheezing.   Cardiovascular: Negative for chest pain and palpitations.  Psychiatric/Behavioral: The patient is nervous/anxious.      Objective: Vitals:   12/15/16 1623  BP: 119/71  Pulse: 93  Resp: 16  Temp: 98.8 F (37.1 C)  TempSrc: Oral  SpO2: 99%  Weight: 144 lb (65.3 kg)  Height: 5\' 7"  (1.702 m)    Physical Exam  Constitutional: She is oriented to person, place, and time. She appears well-developed and well-nourished.  HENT:  Head: Normocephalic and atraumatic.  Cardiovascular: Normal rate, regular rhythm and normal heart sounds.   No murmur heard. Pulmonary/Chest: Effort normal and breath sounds normal. No respiratory distress. She has no wheezes.  Neurological: She is alert and oriented to person, place, and time.  Psychiatric: She has a normal mood and affect. Her behavior is normal. Judgment and thought content normal.    Assessment and Plan Clarita CraneKeely was seen today for headache and anxiety.  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  In one month for her medication monitoring will check an ECG Pt had a baseline ecg in 2015   Dionicia Cerritos A Creta Levin

## 2017-01-16 ENCOUNTER — Other Ambulatory Visit: Payer: Self-pay | Admitting: Family Medicine

## 2017-01-16 ENCOUNTER — Telehealth: Payer: Self-pay | Admitting: Family Medicine

## 2017-01-16 NOTE — Telephone Encounter (Signed)
Please advise 

## 2017-01-16 NOTE — Telephone Encounter (Signed)
Pt is calling to request a refill of her adderall 5mg  tablet.  Please advise if OV is needed or when script is ready to be picked up. [phone]

## 2017-01-20 ENCOUNTER — Telehealth: Payer: Self-pay

## 2017-01-20 MED ORDER — AMPHETAMINE-DEXTROAMPHETAMINE 5 MG PO TABS: 5.0000 mg | ORAL_TABLET | Freq: Every day | ORAL | 0 refills | Status: DC

## 2017-01-20 NOTE — Telephone Encounter (Signed)
Please notify the patient she needs an appt in September for med check. Please notify her to pick up July's script

## 2017-01-20 NOTE — Telephone Encounter (Signed)
Pt has rx for adderall at 102. Pt mailbox full and unable to leave voicemail.

## 2017-02-04 ENCOUNTER — Other Ambulatory Visit: Payer: Self-pay | Admitting: Certified Nurse Midwife

## 2017-02-04 DIAGNOSIS — Z30011 Encounter for initial prescription of contraceptive pills: Secondary | ICD-10-CM

## 2017-02-28 ENCOUNTER — Other Ambulatory Visit: Payer: Self-pay | Admitting: Obstetrics

## 2017-02-28 ENCOUNTER — Encounter: Payer: Self-pay | Admitting: Obstetrics

## 2017-02-28 ENCOUNTER — Ambulatory Visit (INDEPENDENT_AMBULATORY_CARE_PROVIDER_SITE_OTHER): Payer: 59 | Admitting: Obstetrics

## 2017-02-28 VITALS — BP 129/73 | HR 73 | Wt 135.0 lb

## 2017-02-28 DIAGNOSIS — Z30011 Encounter for initial prescription of contraceptive pills: Secondary | ICD-10-CM

## 2017-02-28 DIAGNOSIS — Z3041 Encounter for surveillance of contraceptive pills: Secondary | ICD-10-CM

## 2017-02-28 DIAGNOSIS — Z01419 Encounter for gynecological examination (general) (routine) without abnormal findings: Secondary | ICD-10-CM

## 2017-02-28 DIAGNOSIS — Z1389 Encounter for screening for other disorder: Secondary | ICD-10-CM | POA: Diagnosis not present

## 2017-02-28 DIAGNOSIS — R87612 Low grade squamous intraepithelial lesion on cytologic smear of cervix (LGSIL): Secondary | ICD-10-CM

## 2017-02-28 DIAGNOSIS — Z113 Encounter for screening for infections with a predominantly sexual mode of transmission: Secondary | ICD-10-CM

## 2017-02-28 DIAGNOSIS — Z0000 Encounter for general adult medical examination without abnormal findings: Secondary | ICD-10-CM

## 2017-02-28 MED ORDER — NORETHINDRONE 0.35 MG PO TABS: 1.0000 | ORAL_TABLET | Freq: Every day | ORAL | 12 refills | Status: DC

## 2017-02-28 MED ORDER — VITAFOL ULTRA 29-0.6-0.4-200 MG PO CAPS: 1.0000 | ORAL_CAPSULE | Freq: Every day | ORAL | 11 refills | Status: DC

## 2017-02-28 NOTE — Progress Notes (Signed)
Subjective:        Connie Cobb is a 26 y.o. female here for a routine exam.  Current complaints: None.    Personal health questionnaire:  Is patient Ashkenazi Jewish, have a family history of breast and/or ovarian cancer: yes Is there a family history of uterine cancer diagnosed at age < 4550, gastrointestinal cancer, urinary tract cancer, family member who is a Personnel officerLynch syndrome-associated carrier: no Is the patient overweight and hypertensive, family history of diabetes, personal history of gestational diabetes, preeclampsia or PCOS: no Is patient over 3955, have PCOS,  family history of premature CHD under age 27, diabetes, smoke, have hypertension or peripheral artery disease:  no At any time, has a partner hit, kicked or otherwise hurt or frightened you?: no Over the past 2 weeks, have you felt down, depressed or hopeless?: no Over the past 2 weeks, have you felt little interest or pleasure in doing things?:no   Gynecologic History Patient's last menstrual period was 02/08/2017 (exact date). Contraception: oral progesterone-only contraceptive Last Pap: 2017. Results were: normal Last mammogram: n/a. Results were: n/a  Obstetric History OB History  Gravida Para Term Preterm AB Living  2 1 1   1 1   SAB TAB Ectopic Multiple Live Births        0 1    # Outcome Date GA Lbr Len/2nd Weight Sex Delivery Anes PTL Lv  2 Term 01/27/16 3040w2d 19:09 / 00:30 8 lb 4.5 oz (3.755 kg) M Vag-Spont None  LIV     Birth Comments: Skin tear on scalp  1 AB 06/02/08              Past Medical History:  Diagnosis Date  . Allergy   . Anemia   . GERD (gastroesophageal reflux disease)   . Migraine     Past Surgical History:  Procedure Laterality Date  . TONSILLECTOMY       Current Outpatient Prescriptions:  .  amphetamine-dextroamphetamine (ADDERALL) 5 MG tablet, Take 1 tablet (5 mg total) by mouth daily., Disp: 30 tablet, Rfl: 0 .  aspirin-acetaminophen-caffeine (EXCEDRIN MIGRAINE) 250-250-65  MG tablet, Take 1 tablet by mouth every 6 (six) hours as needed for headache., Disp: , Rfl:  .  fluticasone (FLONASE) 50 MCG/ACT nasal spray, Place 2 sprays into both nostrils daily., Disp: 16 g, Rfl: 6 .  ibuprofen (ADVIL,MOTRIN) 200 MG tablet, Take 800 mg by mouth every 6 (six) hours as needed for headache., Disp: , Rfl:  .  NORLYDA 0.35 MG tablet, TAKE 1 TABLET BY MOUTH EVERY DAY, Disp: 28 tablet, Rfl: 0 Allergies  Allergen Reactions  . Banana Anaphylaxis  . Sulfonamide Derivatives Hives and Shortness Of Breath    Social History  Substance Use Topics  . Smoking status: Never Smoker  . Smokeless tobacco: Never Used  . Alcohol use No    Family History  Problem Relation Age of Onset  . Diabetes Father   . Hyperlipidemia Father   . Hypertension Father   . Hypertension Maternal Grandmother   . Cancer Maternal Grandfather   . Heart disease Maternal Grandfather   . Hypertension Maternal Grandfather   . Cancer Paternal Grandmother   . Diabetes Unknown        grandparent  . Breast cancer Unknown        grandmother      Review of Systems  Constitutional: negative for fatigue and weight loss Respiratory: negative for cough and wheezing Cardiovascular: negative for chest pain, fatigue and palpitations Gastrointestinal: negative for  abdominal pain and change in bowel habits Musculoskeletal:negative for myalgias Neurological: negative for gait problems and tremors Behavioral/Psych: negative for abusive relationship, depression Endocrine: negative for temperature intolerance    Genitourinary:negative for abnormal menstrual periods, genital lesions, hot flashes, sexual problems and vaginal discharge Integument/breast: negative for breast lump, breast tenderness, nipple discharge and skin lesion(s)    Objective:       BP 129/73   Pulse 73   Wt 135 lb (61.2 kg)   LMP 02/08/2017 (Exact Date)   Breastfeeding? No   BMI 21.14 kg/m  General:   alert  Skin:   no rash or  abnormalities  Lungs:   clear to auscultation bilaterally  Heart:   regular rate and rhythm, S1, S2 normal, no murmur, click, rub or gallop  Breasts:   normal without suspicious masses, skin or nipple changes or axillary nodes  Abdomen:  normal findings: no organomegaly, soft, non-tender and no hernia  Pelvis:  External genitalia: normal general appearance Urinary system: urethral meatus normal and bladder without fullness, nontender Vaginal: normal without tenderness, induration or masses Cervix: normal appearance Adnexa: normal bimanual exam Uterus: anteverted and non-tender, normal size   Lab Review Urine pregnancy test Labs reviewed yes Radiologic studies reviewed no  50% of 20 min visit spent on counseling and coordination of care.    Assessment and Plan:     1. Encounter for routine gynecological examination with Papanicolaou smear of cervix Rx: - Cytology - PAP - Cervicovaginal ancillary only  2. Screen for STD (sexually transmitted disease) Rx: - HIV antibody - Hepatitis B surface antigen - RPR - Hepatitis C antibody  3. Encounter for surveillance of contraceptive pills Rx: - norethindrone (NORLYDA) 0.35 MG tablet; Take 1 tablet (0.35 mg total) by mouth daily.  Dispense: 28 tablet; Refill: 12  4. Routine adult health maintenance Rx: - Prenat-Fe Poly-Methfol-FA-DHA (VITAFOL ULTRA) 29-0.6-0.4-200 MG CAPS; Take 1 capsule by mouth daily before breakfast.  Dispense: 30 capsule; Refill: 11  5. LGSIL on Pap smear of cervix - colposcopy done and agrees with paps - yearly paps   Plan:    Education reviewed: calcium supplements, depression evaluation, low fat, low cholesterol diet, safe sex/STD prevention, self breast exams and weight bearing exercise. Contraception: oral progesterone-only contraceptive. Follow up in: 1 year.   No orders of the defined types were placed in this encounter.  No orders of the defined types were placed in this encounter.

## 2017-02-28 NOTE — Progress Notes (Signed)
Patient is in the office today for annual exam. She also needs refill of her birth control.

## 2017-03-01 LAB — CERVICOVAGINAL ANCILLARY ONLY
Bacterial vaginitis: NEGATIVE
CANDIDA VAGINITIS: NEGATIVE
Chlamydia: NEGATIVE
Neisseria Gonorrhea: NEGATIVE
Trichomonas: NEGATIVE

## 2017-03-02 LAB — RPR, QUANT+TP ABS (REFLEX): TREPONEMA PALLIDUM AB: NEGATIVE

## 2017-03-02 LAB — HEPATITIS B SURFACE ANTIGEN: Hepatitis B Surface Ag: NEGATIVE

## 2017-03-02 LAB — HIV ANTIBODY (ROUTINE TESTING W REFLEX): HIV Screen 4th Generation wRfx: NONREACTIVE

## 2017-03-02 LAB — HEPATITIS C ANTIBODY: HEP C VIRUS AB: 0.1 {s_co_ratio} (ref 0.0–0.9)

## 2017-03-02 LAB — RPR: RPR: REACTIVE — AB

## 2017-03-06 ENCOUNTER — Ambulatory Visit: Payer: 59 | Admitting: Obstetrics

## 2017-03-08 LAB — CYTOLOGY - PAP
DIAGNOSIS: UNDETERMINED — AB
HPV: NOT DETECTED

## 2017-04-09 ENCOUNTER — Telehealth: Payer: Self-pay | Admitting: *Deleted

## 2017-04-09 NOTE — Telephone Encounter (Signed)
Prescription never pick up.   shredded

## 2017-06-15 ENCOUNTER — Telehealth: Payer: Self-pay | Admitting: Family Medicine

## 2017-06-15 NOTE — Telephone Encounter (Signed)
Copied from CRM [id number]. Topic: Quick Communication - See Telephone Encounter >> Jun 15, 2017 12:20 PM Arlyss Gandyichardson, Izaya Netherton N, NT wrote: CRM for notification. See Telephone encounter for: Pt requesting a refill of her Adderall.   06/15/17.

## 2017-06-15 NOTE — Telephone Encounter (Signed)
Controlled substance 

## 2017-06-20 ENCOUNTER — Telehealth: Payer: Self-pay

## 2017-06-20 MED ORDER — AMPHETAMINE-DEXTROAMPHETAMINE 5 MG PO TABS: 5.0000 mg | ORAL_TABLET | Freq: Every day | ORAL | 0 refills | Status: DC

## 2017-06-20 MED FILL — DEXTROAMP-AMPHETAMINE 5 MG: 5 | 30 days supply | Qty: 30 | Fill #0

## 2017-06-20 NOTE — Telephone Encounter (Signed)
LVM that rx was sent  

## 2017-06-20 NOTE — Telephone Encounter (Signed)
Pt came in to the office needing hard copy of adderall. Insurance will only pay for her to fill [facility] or [place]. Called to cancel rx at [facility]. Pt would like printed rx.

## 2017-06-20 NOTE — Telephone Encounter (Signed)
Printed and signed and left for patient.

## 2017-06-20 NOTE — Telephone Encounter (Signed)
Refill sent electronically Notify the patient please.

## 2017-06-20 NOTE — Addendum Note (Signed)
Addended by: Collie SiadSTALLINGS, Eilidh Marcano A on: 06/20/2017 09:51 AM   Modules accepted: Orders

## 2017-08-08 ENCOUNTER — Telehealth: Payer: Self-pay | Admitting: Family Medicine

## 2017-08-08 NOTE — Telephone Encounter (Signed)
Copied from CRM [phone]. Topic: Quick Communication - Rx Refill/Question >> Aug 08, 2017 10:31 AM Diana EvesHoyt, Maryann B wrote: Medication: adderall  Preferred Pharmacy (with phone number or street name): [facility] - [place], [place] - [address]   Agent: Please be advised that RX refills may take up to 3 business days. We ask that you follow-up with your pharmacy.

## 2017-08-09 NOTE — Telephone Encounter (Signed)
Please advise. dgaddy  

## 2017-08-09 NOTE — Telephone Encounter (Signed)
Patient needs an office visit for adderall med refill.

## 2017-08-16 ENCOUNTER — Ambulatory Visit: Payer: 59 | Admitting: Family Medicine

## 2017-08-16 ENCOUNTER — Other Ambulatory Visit: Payer: Self-pay

## 2017-08-16 ENCOUNTER — Encounter: Payer: Self-pay | Admitting: Family Medicine

## 2017-08-16 ENCOUNTER — Ambulatory Visit (INDEPENDENT_AMBULATORY_CARE_PROVIDER_SITE_OTHER): Payer: 59 | Admitting: Family Medicine

## 2017-08-16 VITALS — BP 100/62 | HR 86 | Temp 98.5°F | Resp 16 | Ht 67.5 in | Wt 140.4 lb

## 2017-08-16 DIAGNOSIS — F900 Attention-deficit hyperactivity disorder, predominantly inattentive type: Secondary | ICD-10-CM

## 2017-08-16 DIAGNOSIS — G43009 Migraine without aura, not intractable, without status migrainosus: Secondary | ICD-10-CM | POA: Diagnosis not present

## 2017-08-16 MED ORDER — AMPHETAMINE-DEXTROAMPHETAMINE 5 MG PO TABS: 5.0000 mg | ORAL_TABLET | Freq: Every day | ORAL | 0 refills | Status: DC

## 2017-08-16 MED ORDER — TOPIRAMATE 25 MG PO TABS
25.0000 mg | ORAL_TABLET | Freq: Every day | ORAL | 3 refills | Status: DC
Start: 2017-08-16 — End: 2017-11-07

## 2017-08-16 MED ORDER — ONDANSETRON HCL 4 MG PO TABS: 4.0000 mg | ORAL_TABLET | Freq: Three times a day (TID) | ORAL | 0 refills | Status: DC | PRN

## 2017-08-16 MED FILL — DEXTROAMP-AMPHETAMINE 5 MG: 5 | 30 days supply | Qty: 30 | Fill #0

## 2017-08-16 MED FILL — TOPIRAMATE 25 MG TAB: 25 | 30 days supply | Qty: 30 | Fill #0

## 2017-08-16 MED FILL — ONDANSETRON HCL 4 MG TABLET: 4 | 7 days supply | Qty: 20 | Fill #0

## 2017-08-16 NOTE — Progress Notes (Signed)
Chief Complaint  Patient presents with  . Medication Refill    ADDERALL    HPI   Migraine- new problem She is going to sleep and waking up with migraines She has nausea and photophobia She reports that she stopped the topamax when she was pregnant She is taking excedrin ha She reports that she thought they were sinus headaches but they are occurring daily She was previously on topamax once daily She is on ocps  ADD -  Follow up Pt has Connie diagnosis of ADD/ADHD She is taking her dose intermittently based on her study and work schedule SHe does not use other stimulants such as energy drinks or caffeine SHe is able to sleep at night and denies unintentional weight loss SHe denies mood swings    Past Medical History:  Diagnosis Date  . Allergy   . Anemia   . GERD (gastroesophageal reflux disease)   . Migraine     Current Outpatient Medications  Medication Sig Dispense Refill  . amphetamine-dextroamphetamine (ADDERALL) 5 MG tablet Take 1 tablet (5 mg total) by mouth daily. 30 tablet 0  . aspirin-acetaminophen-caffeine (EXCEDRIN MIGRAINE) 250-250-65 MG tablet Take 1 tablet by mouth every 6 (six) hours as needed for headache.    . ibuprofen (ADVIL,MOTRIN) 200 MG tablet Take 800 mg by mouth every 6 (six) hours as needed for headache.    . norethindrone (NORLYDA) 0.35 MG tablet Take 1 tablet (0.35 mg total) by mouth daily. 28 tablet 12  . Prenat-Fe Poly-Methfol-FA-DHA (VITAFOL ULTRA) 29-0.6-0.4-200 MG CAPS Take 1 capsule by mouth daily before breakfast. 30 capsule 11  . fluticasone (FLONASE) 50 MCG/ACT nasal spray Place 2 sprays into both nostrils daily. (Patient not taking: Reported on 08/16/2017) 16 g 6  . NORLYDA 0.35 MG tablet TAKE 1 TABLET BY MOUTH EVERY DAY 28 tablet 0  . ondansetron (ZOFRAN) 4 MG tablet Take 1 tablet (4 mg total) by mouth every 8 (eight) hours as needed for nausea or vomiting. 20 tablet 0  . topiramate (TOPAMAX) 25 MG tablet Take 1 tablet (25 mg total) by  mouth daily. Start with 1/2 tablet for Connie week 30 tablet 3   No current facility-administered medications for this visit.     Allergies:  Allergies  Allergen Reactions  . Banana Anaphylaxis  . Sulfonamide Derivatives Hives and Shortness Of Breath    Past Surgical History:  Procedure Laterality Date  . TONSILLECTOMY      Social History   Socioeconomic History  . Marital status: Single    Spouse name: None  . Number of children: None  . Years of education: None  . Highest education level: None  Social Needs  . Financial resource strain: None  . Food insecurity - worry: None  . Food insecurity - inability: None  . Transportation needs - medical: None  . Transportation needs - non-medical: None  Occupational History  . None  Tobacco Use  . Smoking status: Never Smoker  . Smokeless tobacco: Never Used  Substance and Sexual Activity  . Alcohol use: No    Alcohol/week: 0.0 oz  . Drug use: No  . Sexual activity: Yes    Partners: Male    Birth control/protection: Pill  Other Topics Concern  . None  Social History Narrative  . None    Family History  Problem Relation Age of Onset  . Diabetes Father   . Hyperlipidemia Father   . Hypertension Father   . Hypertension Maternal Grandmother   . Cancer Maternal Grandfather   .  Heart disease Maternal Grandfather   . Hypertension Maternal Grandfather   . Cancer Paternal Grandmother   . Diabetes Unknown        grandparent  . Breast cancer Unknown        grandmother     ROS Review of Systems See HPI Constitution: No fevers or chills No malaise No diaphoresis Skin: No rash or itching Eyes: no blurry vision, no double vision GU: no dysuria or hematuria Neuro: no dizziness or slurred speech all others reviewed and negative   Objective: Vitals:   08/16/17 1545  BP: 100/62  Pulse: 86  Resp: 16  Temp: 98.5 F (36.9 C)  TempSrc: Oral  SpO2: 99%  Weight: 140 lb 6.4 oz (63.7 kg)  Height: 5' 7.5" (1.715 m)     Physical Exam Physical Exam  Constitutional: She is oriented to person, place, and time. She appears well-developed and well-nourished.  HENT:  Head: Normocephalic and atraumatic.  Eyes: Conjunctivae and EOM are normal.  Cardiovascular: Normal rate, regular rhythm and normal heart sounds.   Pulmonary/Chest: Effort normal and breath sounds normal. No respiratory distress. She has no wheezes.  Abdominal: Normal appearance and bowel sounds are normal. There is no tenderness. There is no CVA tenderness.  Neurological: She is alert and oriented to person, place, and time. CN II-XII grossly intact. Motor in all extremities 5/5    Assessment and Plan Connie Cobb was seen today for medication refill.  Diagnoses and all orders for this visit:  Attention deficit hyperactivity disorder (ADHD), predominantly inattentive type- usage reviewed and deemed appropriate. PMP[place] database checked Refilled meds today -     amphetamine-dextroamphetamine (ADDERALL) 5 MG tablet; Take 1 tablet (5 mg total) by mouth daily.   Migraine without aura and without status migrainosus, not intractable- discussed prophylaxis to prevent daily symptoms zofran for nausea -     topiramate (TOPAMAX) 25 MG tablet; Take 1 tablet (25 mg total) by mouth daily. Start with 1/2 tablet for Connie week -     ondansetron (ZOFRAN) 4 MG tablet; Take 1 tablet (4 mg total) by mouth every 8 (eight) hours as needed for nausea or vomiting.      Connie Cobb Connie Cobb

## 2017-08-16 NOTE — Patient Instructions (Addendum)
   IF you received an x-ray today, you will receive an invoice from [facility]. Please contact [facility] at [phone] with questions or concerns regarding your invoice.   IF you received labwork today, you will receive an invoice from [facility]. Please contact [facility] at [phone] with questions or concerns regarding your invoice.   Our billing staff will not be able to assist you with questions regarding bills from these companies.  You will be contacted with the lab results as soon as they are available. The fastest way to get your results is to activate your My Chart account. Instructions are located on the last page of this paperwork. If you have not heard from us regarding the results in 2 weeks, please contact this office.      Migraine Headache A migraine headache is an intense, throbbing pain on one side or both sides of the head. Migraines may also cause other symptoms, such as nausea, vomiting, and sensitivity to light and noise. What are the causes? Doing or taking certain things may also trigger migraines, such as:  Alcohol.  Smoking.  Medicines, such as: ? Medicine used to treat chest pain (nitroglycerine). ? Birth control pills. ? Estrogen pills. ? Certain blood pressure medicines.  Aged cheeses, chocolate, or caffeine.  Foods or drinks that contain nitrates, glutamate, aspartame, or tyramine.  Physical activity.  Other things that may trigger a migraine include:  Menstruation.  Pregnancy.  Hunger.  Stress, lack of sleep, too much sleep, or fatigue.  Weather changes.  What increases the risk? The following factors may make you more likely to experience migraine headaches:  Age. Risk increases with age.  Family history of migraine headaches.  Being Caucasian.  Depression and anxiety.  Obesity.  Being a woman.  Having a hole in the heart (patent foramen ovale) or other heart problems.  What are the signs or  symptoms? The main symptom of this condition is pulsating or throbbing pain. Pain may:  Happen in any area of the head, such as on one side or both sides.  Interfere with daily activities.  Get worse with physical activity.  Get worse with exposure to bright lights or loud noises.  Other symptoms may include:  Nausea.  Vomiting.  Dizziness.  General sensitivity to bright lights, loud noises, or smells.  Before you get a migraine, you may get warning signs that a migraine is developing (aura). An aura may include:  Seeing flashing lights or having blind spots.  Seeing bright spots, halos, or zigzag lines.  Having tunnel vision or blurred vision.  Having numbness or a tingling feeling.  Having trouble talking.  Having muscle weakness.  How is this diagnosed? A migraine headache can be diagnosed based on:  Your symptoms.  A physical exam.  Tests, such as CT scan or MRI of the head. These imaging tests can help rule out other causes of headaches.  Taking fluid from the spine (lumbar puncture) and analyzing it (cerebrospinal fluid analysis, or CSF analysis).  How is this treated? A migraine headache is usually treated with medicines that:  Relieve pain.  Relieve nausea.  Prevent migraines from coming back.  Treatment may also include:  Acupuncture.  Lifestyle changes like avoiding foods that trigger migraines.  Follow these instructions at home: Medicines  Take over-the-counter and prescription medicines only as told by your health care provider.  Do not drive or use heavy machinery while taking prescription pain medicine.  To prevent or treat constipation while you   are taking prescription pain medicine, your health care provider may recommend that you: ? Drink enough fluid to keep your urine clear or pale yellow. ? Take over-the-counter or prescription medicines. ? Eat foods that are high in fiber, such as fresh fruits and vegetables, whole grains,  and beans. ? Limit foods that are high in fat and processed sugars, such as fried and sweet foods. Lifestyle  Avoid alcohol use.  Do not use any products that contain nicotine or tobacco, such as cigarettes and e-cigarettes. If you need help quitting, ask your health care provider.  Get at least 8 hours of sleep every night.  Limit your stress. General instructions   Keep a journal to find out what may trigger your migraine headaches. For example, write down: ? What you eat and drink. ? How much sleep you get. ? Any change to your diet or medicines.  If you have a migraine: ? Avoid things that make your symptoms worse, such as bright lights. ? It may help to lie down in a dark, quiet room. ? Do not drive or use heavy machinery. ? Ask your health care provider what activities are safe for you while you are experiencing symptoms.  Keep all follow-up visits as told by your health care provider. This is important. Contact a health care provider if:  You develop symptoms that are different or more severe than your usual migraine symptoms. Get help right away if:  Your migraine becomes severe.  You have a fever.  You have a stiff neck.  You have vision loss.  Your muscles feel weak or like you cannot control them.  You start to lose your balance often.  You develop trouble walking.  You faint. This information is not intended to replace advice given to you by your health care provider. Make sure you discuss any questions you have with your health care provider. Document Released: 06/19/2005 Document Revised: 01/07/2016 Document Reviewed: 12/06/2015 Elsevier Interactive Patient Education  2017 [employer]  

## 2017-10-08 ENCOUNTER — Ambulatory Visit (INDEPENDENT_AMBULATORY_CARE_PROVIDER_SITE_OTHER): Payer: Self-pay | Admitting: Family Medicine

## 2017-10-08 VITALS — BP 115/70 | HR 88 | Temp 98.7°F | Resp 16 | Wt 141.4 lb

## 2017-10-08 DIAGNOSIS — R05 Cough: Secondary | ICD-10-CM

## 2017-10-08 DIAGNOSIS — J309 Allergic rhinitis, unspecified: Secondary | ICD-10-CM

## 2017-10-08 DIAGNOSIS — R059 Cough, unspecified: Secondary | ICD-10-CM

## 2017-10-08 MED ORDER — CETIRIZINE HCL 10 MG PO TABS: 10.0000 mg | ORAL_TABLET | Freq: Every day | ORAL | 0 refills | Status: DC

## 2017-10-08 MED ORDER — FLUTICASONE PROPIONATE 50 MCG/ACT NA SUSP: 2.0000 | Freq: Every day | NASAL | 6 refills | Status: DC

## 2017-10-08 MED ORDER — MONTELUKAST SODIUM 10 MG PO TABS
10.0000 mg | ORAL_TABLET | Freq: Every day | ORAL | 0 refills | Status: DC
Start: 2017-10-08 — End: 2017-11-07

## 2017-10-08 MED FILL — FLUTICASONE PROP 50 MCG SPR: 50 | 30 days supply | Qty: 16 | Fill #0

## 2017-10-08 MED FILL — MONTELUKAST SOD 10 MG TAB: 10 | 30 days supply | Qty: 30 | Fill #0

## 2017-10-08 NOTE — Progress Notes (Signed)
Connie Cobb is a 27 y.o. female who present with 2 weeks of cough she has a history of allergic rhinitis and has been self treating her symptoms with advil and benadryl without relief. She reports zyrtec causes her migraines and does not like the flonase so has not been especially compliant with this regimen prescribed by PCP.   Review of Systems  Constitutional: Negative.  Negative for chills, fever and weight loss.  HENT: Negative.  Negative for sinus pain and sore throat.   Eyes: Negative.   Respiratory: Positive for cough. Negative for stridor.   Cardiovascular: Negative.   Gastrointestinal: Negative.   Genitourinary: Negative.   Musculoskeletal: Negative.   Skin: Negative.   Neurological: Negative.   Endo/Heme/Allergies: Negative.   Psychiatric/Behavioral: Negative.     O: Vitals:   10/08/17 1318  BP: 115/70  Pulse: 88  Resp: 16  Temp: 98.7 F (37.1 C)  SpO2: 99%   Physical Exam  Constitutional: She is oriented to person, place, and time. Vital signs are normal. She appears well-developed and well-nourished. She is active. No distress.  HENT:  Head: Normocephalic.  Right Ear: External ear normal. A middle ear effusion is present.  Left Ear: External ear normal.  Nose: Rhinorrhea present.  Mouth/Throat: Uvula is midline and oropharynx is clear and moist. No oropharyngeal exudate.  Neck: Normal range of motion. Neck supple.  Cardiovascular: Normal rate and regular rhythm.  Pulmonary/Chest: Effort normal and breath sounds normal.  Dry cough on exam- frequent  Abdominal: Soft. Bowel sounds are normal.  Musculoskeletal: Normal range of motion.  Lymphadenopathy:       Head (right side): Tonsillar adenopathy present.       Head (left side): Tonsillar adenopathy present.    She has cervical adenopathy.       Right cervical: Superficial cervical and deep cervical adenopathy present.       Left cervical: Superficial cervical and deep cervical adenopathy present.   Neurological: She is alert and oriented to person, place, and time.  Skin: Skin is warm and dry. She is not diaphoretic.    A: 1. Allergic rhinitis, unspecified seasonality, unspecified trigger   2. Cough    P: Advised to d/c Benadryl to treat allergic rhinitis and try to utilize PCP prescribed regimen and f/u with PCP if ineffective. Reviewed efficacy and use and indications of zyrtec/flonase combination and success at treating seasonal allergies.  1. Allergic rhinitis, unspecified seasonality, unspecified trigger - fluticasone (FLONASE) 50 MCG/ACT nasal spray; Place 2 sprays into both nostrils daily. - cetirizine (ZYRTEC) 10 MG tablet; Take 1 tablet (10 mg total) by mouth daily.  2. Cough Medication use and indications discussed and patient verbalized understanding of information provided and agrees with POC at this time. - montelukast (SINGULAIR) 10 MG tablet; Take 1 tablet (10 mg total) by mouth at bedtime.

## 2017-10-08 NOTE — Patient Instructions (Addendum)
Cough, Adult A cough helps to clear your throat and lungs. A cough may last only 2-3 weeks (acute), or it may last longer than 8 weeks (chronic). Many different things can cause a cough. A cough may be a sign of an illness or another medical condition. Follow these instructions at home:  Pay attention to any changes in your cough.  Take medicines only as told by your doctor. ? If you were prescribed an antibiotic medicine, take it as told by your doctor. Do not stop taking it even if you start to feel better. ? Talk with your doctor before you try using a cough medicine.  Drink enough fluid to keep your pee (urine) clear or pale yellow.  If the air is dry, use a cold steam vaporizer or humidifier in your home.  Stay away from things that make you cough at work or at home.  If your cough is worse at night, try using extra pillows to raise your head up higher while you sleep.  Do not smoke, and try not to be around smoke. If you need help quitting, ask your doctor.  Do not have caffeine.  Do not drink alcohol.  Rest as needed. Contact a doctor if:  You have new problems (symptoms).  You cough up yellow fluid (pus).  Your cough does not get better after 2-3 weeks, or your cough gets worse.  Medicine does not help your cough and you are not sleeping well.  You have pain that gets worse or pain that is not helped with medicine.  You have a fever.  You are losing weight and you do not know why.  You have night sweats. Get help right away if:  You cough up blood.  You have trouble breathing.  Your heartbeat is very fast. This information is not intended to replace advice given to you by your health care provider. Make sure you discuss any questions you have with your health care provider. Document Released: 03/02/2011 Document Revised: 11/25/2015 Document Reviewed: 08/26/2014 Else[employer] Interactive Patient Education  2018 [employer] Allergic Rhinitis, Adult Allergic  rhinitis is an allergic reaction that affects the mucous membrane inside the nose. It causes sneezing, a runny or stuffy nose, and the feeling of mucus going down the back of the throat (postnasal drip). Allergic rhinitis can be mild to severe. There are two types of allergic rhinitis:  Seasonal. This type is also called hay fever. It happens only during certain seasons.  Perennial. This type can happen at any time of the year.  What are the causes? This condition happens when the body's defense system (immune system) responds to certain harmless substances called allergens as though they were germs.  Seasonal allergic rhinitis is triggered by pollen, which can come from grasses, trees, and weeds. Perennial allergic rhinitis may be caused by:  House dust mites.  Pet dander.  Mold spores.  What are the signs or symptoms? Symptoms of this condition include:  Sneezing.  Runny or stuffy nose (nasal congestion).  Postnasal drip.  Itchy nose.  Tearing of the eyes.  Trouble sleeping.  Daytime sleepiness.  How is this diagnosed? This condition may be diagnosed based on:  Your medical history.  A physical exam.  Tests to check for related conditions, such as: ? Asthma. ? Pink eye. ? Ear infection. ? Upper respiratory infection.  Tests to find out which allergens trigger your symptoms. These may include skin or blood tests.  How is this treated? There is no   cure for this condition, but treatment can help control symptoms. Treatment may include:  Taking medicines that block allergy symptoms, such as antihistamines. Medicine may be given as a shot, nasal spray, or pill.  Avoiding the allergen.  Desensitization. This treatment involves getting ongoing shots until your body becomes less sensitive to the allergen. This treatment may be done if other treatments do not help.  If taking medicine and avoiding the allergen does not work, new, stronger medicines may be  prescribed.  Follow these instructions at home:  Find out what you are allergic to. Common allergens include smoke, dust, and pollen.  Avoid the things you are allergic to. These are some things you can do to help avoid allergens: ? Replace carpet with wood, tile, or vinyl flooring. Carpet can trap dander and dust. ? Do not smoke. Do not allow smoking in your home. ? Change your heating and air conditioning filter at least once a month. ? During allergy season:  Keep windows closed as much as possible.  Plan outdoor activities when pollen counts are lowest. This is usually during the evening hours.  When coming indoors, change clothing and shower before sitting on furniture or bedding.  Take over-the-counter and prescription medicines only as told by your health care provider.  Keep all follow-up visits as told by your health care provider. This is important. Contact a health care provider if:  You have a fever.  You develop a persistent cough.  You make whistling sounds when you breathe (you wheeze).  Your symptoms interfere with your normal daily activities. Get help right away if:  You have shortness of breath. Summary  This condition can be managed by taking medicines as directed and avoiding allergens.  Contact your health care provider if you develop a persistent cough or fever.  During allergy season, keep windows closed as much as possible. This information is not intended to replace advice given to you by your health care provider. Make sure you discuss any questions you have with your health care provider. Document Released: 03/14/2001 Document Revised: 07/27/2016 Document Reviewed: 07/27/2016 [employer] Interactive Patient Education  2018 [employer]   

## 2017-10-15 MED FILL — TOPIRAMATE 25 MG TAB: 25 | 30 days supply | Qty: 30 | Fill #1

## 2017-11-07 ENCOUNTER — Other Ambulatory Visit: Payer: Self-pay

## 2017-11-07 ENCOUNTER — Ambulatory Visit: Payer: 59 | Admitting: Family Medicine

## 2017-11-07 ENCOUNTER — Encounter: Payer: Self-pay | Admitting: Family Medicine

## 2017-11-07 ENCOUNTER — Ambulatory Visit (INDEPENDENT_AMBULATORY_CARE_PROVIDER_SITE_OTHER): Payer: 59 | Admitting: Family Medicine

## 2017-11-07 VITALS — BP 108/70 | HR 95 | Temp 98.3°F | Resp 16 | Ht 67.5 in | Wt 142.4 lb

## 2017-11-07 DIAGNOSIS — A539 Syphilis, unspecified: Secondary | ICD-10-CM

## 2017-11-07 DIAGNOSIS — Z131 Encounter for screening for diabetes mellitus: Secondary | ICD-10-CM | POA: Diagnosis not present

## 2017-11-07 DIAGNOSIS — Z0000 Encounter for general adult medical examination without abnormal findings: Secondary | ICD-10-CM | POA: Diagnosis not present

## 2017-11-07 DIAGNOSIS — G43009 Migraine without aura, not intractable, without status migrainosus: Secondary | ICD-10-CM | POA: Diagnosis not present

## 2017-11-07 DIAGNOSIS — Z113 Encounter for screening for infections with a predominantly sexual mode of transmission: Secondary | ICD-10-CM

## 2017-11-07 DIAGNOSIS — Z13220 Encounter for screening for lipoid disorders: Secondary | ICD-10-CM

## 2017-11-07 DIAGNOSIS — Z87898 Personal history of other specified conditions: Secondary | ICD-10-CM | POA: Diagnosis not present

## 2017-11-07 MED ORDER — TOPIRAMATE 25 MG PO TABS: 25.0000 mg | ORAL_TABLET | Freq: Two times a day (BID) | ORAL | 3 refills | Status: DC

## 2017-11-07 MED ORDER — ONDANSETRON HCL 4 MG PO TABS: 4.0000 mg | ORAL_TABLET | Freq: Three times a day (TID) | ORAL | 0 refills | Status: DC | PRN

## 2017-11-07 MED ORDER — SCOPOLAMINE 1 MG/3DAYS TD PT72: 1.0000 | MEDICATED_PATCH | TRANSDERMAL | 0 refills | Status: DC

## 2017-11-07 MED FILL — ONDANSETRON HCL 4 MG TABLET: 4 | 7 days supply | Qty: 20 | Fill #0

## 2017-11-07 MED FILL — TRANSDERM-SCOP 1.5 MG/72HR: 1 | 12 days supply | Qty: 4 | Fill #0

## 2017-11-07 NOTE — Patient Instructions (Signed)

## 2017-11-07 NOTE — Progress Notes (Signed)
Chief Complaint  Patient presents with  . review current medications  . Annual Exam    no pap, pt is going to [employer] and needs a patch.    Subjective:  Connie Cobb is a 27 y.o. female here for a health maintenance visit.  Patient is established pt  Headaches She reports that she notices that her headaches are a little improved but continues to have headaches a few times a week. 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  Patient Active Problem List   Diagnosis Date Noted  . Ringing in right ear 06/15/2014  . Inattention 01/23/2013  . Migraine, unspecified, without mention of intractable migraine without mention of status migrainosus 01/23/2013  . Chlamydia 12/22/2011  . GERD (gastroesophageal reflux disease) 10/29/2011  . Sinusitis 08/21/2011  . Allergic rhinitis, seasonal 08/21/2011  . UTI (lower urinary tract infection) 08/21/2011  . Screening for malignant neoplasm of the cervix 05/22/2011  . Routine gynecological examination 05/22/2011  . Family history of breast cancer 05/22/2011  . Contact with or exposure to other viral diseases(V01.79) 12/01/2010  . Anxiety 12/01/2010  . RINGWORM 08/01/2010  . VAGINITIS 08/01/2010  . ANEMIA-NOS 05/19/2010    Past Medical History:  Diagnosis Date  . Allergy   . Anemia   . GERD (gastroesophageal reflux disease)   . Migraine     Past Surgical History:  Procedure Laterality Date  . TONSILLECTOMY       Outpatient Medications Prior to Visit  Medication Sig Dispense Refill  . amphetamine-dextroamphetamine (ADDERALL) 5 MG tablet Take 1  tablet (5 mg total) by mouth daily. 30 tablet 0  . cetirizine (ZYRTEC) 10 MG tablet Take 1 tablet (10 mg total) by mouth daily. 30 tablet 0  . diphenhydrAMINE (BENADRYL) 25 mg capsule Take 25 mg by mouth every 6 (six) hours as needed.    . fluticasone (FLONASE) 50 MCG/ACT nasal spray Place 2 sprays into both nostrils daily. 16 g 6  . guaiFENesin (ROBITUSSIN) 100 MG/5ML liquid Take 200 mg by mouth 3 (three) times daily as needed for cough.    Marland Kitchen ibuprofen (ADVIL,MOTRIN) 200 MG tablet Take 800 mg by mouth every 6 (six) hours as needed for headache.    . norethindrone (NORLYDA) 0.35 MG tablet Take 1 tablet (0.35 mg total) by mouth daily. 28 tablet 12  . ondansetron (ZOFRAN) 4 MG tablet Take 1 tablet (4 mg total) by mouth every 8 (eight) hours as needed for nausea or vomiting. 20 tablet 0  . topiramate (TOPAMAX) 25 MG tablet Take 1 tablet (25 mg total) by mouth daily. Start with 1/2 tablet for a week 30 tablet 3  . aspirin-acetaminophen-caffeine (EXCEDRIN MIGRAINE) 250-250-65 MG tablet Take 1 tablet by mouth every 6 (six) hours as needed for headache.    . Prenat-Fe Poly-Methfol-FA-DHA (VITAFOL ULTRA) 29-0.6-0.4-200 MG CAPS Take 1 capsule by mouth daily before breakfast. (Patient not taking: Reported on 11/07/2017) 30 capsule 11  . montelukast (SINGULAIR) 10 MG tablet Take 1 tablet (10 mg total) by mouth at bedtime. 30 tablet 0  . NORLYDA 0.35 MG tablet TAKE 1 TABLET BY  MOUTH EVERY DAY 28 tablet 0   No facility-administered medications prior to visit.     Allergies  Allergen Reactions  . Banana Anaphylaxis  . Sulfonamide Derivatives Hives and Shortness Of Breath     Family History  Problem Relation Age of Onset  . Diabetes Father   . Hyperlipidemia Father   . Hypertension Father   . Hypertension Maternal Grandmother   . Cancer Maternal Grandfather   . Heart disease Maternal Grandfather   . Hypertension Maternal Grandfather   . Cancer Paternal Grandmother   . Diabetes Unknown         grandparent  . Breast cancer Unknown        grandmother     Health Habits: Dental Exam: up to date Eye Exam: up to date Exercise: 0 times/week on average   Social History   Socioeconomic History  . Marital status: Single    Spouse name: Not on file  . Number of children: Not on file  . Years of education: Not on file  . Highest education level: Not on file  Occupational History  . Not on file  Social Needs  . Financial resource strain: Not on file  . Food insecurity:    Worry: Not on file    Inability: Not on file  . Transportation needs:    Medical: Not on file    Non-medical: Not on file  Tobacco Use  . Smoking status: Never Smoker  . Smokeless tobacco: Never Used  Substance and Sexual Activity  . Alcohol use: No    Alcohol/week: 0.0 oz  . Drug use: No  . Sexual activity: Yes    Partners: Male    Birth control/protection: Pill  Lifestyle  . Physical activity:    Days per week: Not on file    Minutes per session: Not on file  . Stress: Not on file  Relationships  . Social connections:    Talks on phone: Not on file    Gets together: Not on file    Attends religious service: Not on file    Active member of club or organization: Not on file    Attends meetings of clubs or organizations: Not on file    Relationship status: Not on file  . Intimate partner violence:    Fear of current or ex partner: Not on file    Emotionally abused: Not on file    Physically abused: Not on file    Forced sexual activity: Not on file  Other Topics Concern  . Not on file  Social History Narrative  . Not on file   Social History   Substance and Sexual Activity  Alcohol Use No  . Alcohol/week: 0.0 oz   Social History   Tobacco Use  Smoking Status Never Smoker  Smokeless Tobacco Never Used   Social History   Substance and Sexual Activity  Drug Use No    GYN: Sexual Health Menstrual status: regular menses LMP: Patient's last menstrual period was  11/03/2017. Last pap smear: see HM section History of abnormal pap smears:  Sexually active: with female partner Current contraception: ocp  Health Maintenance: See under health Maintenance activity for review of completion dates as well. Immunization History  Administered Date(s) Administered  . Influenza Split 03/27/2011  . Tdap 01/28/2016      Depression Screen-PHQ2/9 Depression screen [facility] 2/9 11/07/2017 08/16/2017 12/15/2016 10/18/2016 12/08/2014  Decreased Interest 0 0 0 0 0  Down, Depressed, Hopeless 0 0 0 0 0  PHQ -  2 Score 0 0 0 0 0       Depression Severity and Treatment Recommendations:  0-4= None  5-9= Mild / Treatment: Support, educate to call if worse; return in one month  10-14= Moderate / Treatment: Support, watchful waiting; Antidepressant or Psycotherapy  15-19= Moderately severe / Treatment: Antidepressant OR Psychotherapy  >= 20 = Major depression, severe / Antidepressant AND Psychotherapy    Review of Systems   Review of Systems  Constitutional: Negative for chills, fever and weight loss.  HENT: Negative for ear pain, hearing loss and tinnitus.   Eyes: Negative for blurred vision, double vision and photophobia.  Respiratory: Negative for cough, shortness of breath and wheezing.   Cardiovascular: Negative for chest pain, palpitations and leg swelling.  Gastrointestinal: Positive for nausea and vomiting. Negative for abdominal pain and diarrhea.  Genitourinary: Negative for dysuria, frequency and urgency.  Musculoskeletal: Negative for back pain, joint pain and myalgias.  Skin: Negative for itching and rash.  Neurological: Negative for dizziness, tingling and headaches.  Psychiatric/Behavioral: Negative for depression. The patient is not nervous/anxious and does not have insomnia.     See HPI for ROS as well.    Objective:   Vitals:   11/07/17 1352  BP: 108/70  Pulse: 95  Resp: 16  Temp: 98.3 F (36.8 C)  TempSrc: Oral  SpO2: 100%  Weight: 142  lb 6.4 oz (64.6 kg)  Height: 5' 7.5" (1.715 m)    Body mass index is 21.97 kg/m.  Physical Exam  Constitutional: She is oriented to person, place, and time. She appears well-developed and well-nourished.  HENT:  Head: Normocephalic and atraumatic.  Right Ear: External ear normal.  Left Ear: External ear normal.  Eyes: Conjunctivae and EOM are normal.  Neck: Normal range of motion. Neck supple. No thyromegaly present.  Cardiovascular: Normal rate, regular rhythm and normal heart sounds.  No murmur heard. Pulmonary/Chest: Effort normal and breath sounds normal. No stridor. No respiratory distress. She has no wheezes.  Abdominal: Soft. Bowel sounds are normal. She exhibits no distension and no mass. There is no tenderness. There is no rebound and no guarding. No hernia.  Musculoskeletal: Normal range of motion. She exhibits no edema.  Neurological: She is alert and oriented to person, place, and time.  Skin: Skin is warm. Capillary refill takes less than 2 seconds.  Psychiatric: She has a normal mood and affect. Her behavior is normal. Judgment and thought content normal.       Assessment/Plan:   Patient was seen for a health maintenance exam.  Counseled the patient on health maintenance issues. Reviewed her health mainteance schedule and ordered appropriate tests (see orders.) Counseled on regular exercise and weight management. Recommend regular eye exams and dental cleaning.   The following issues were addressed today for health maintenance:   Abygail was seen today for review current medications and annual exam.  Diagnoses and all orders for this visit:  Encounter for health maintenance examination in adult- age appropriate screenings reviewed  Migraine without aura and without status migrainosus, not intractable- will treat with increasing dose of topamax to 51m bid and titrate up -     topiramate (TOPAMAX) 25 MG tablet; Take 1 tablet (25 mg total) by mouth 2 (two) times  daily. -     ondansetron (ZOFRAN) 4 MG tablet; Take 1 tablet (4 mg total) by mouth every 8 (eight) hours as needed for nausea or vomiting. -     TSH -     CBC  Screen for STD (sexually transmitted disease) -     HIV antibody -     Hepatitis B surface antigen -     RPR  Screening for diabetes mellitus -     Hemoglobin A1c  Screening, lipid -     Lipid panel  H/O motion sickness- discussed motion sickness Discuss how to use scopolamine patch  -     scopolamine (TRANSDERM-SCOP, 1.5 MG,) 1 MG/3DAYS; Place 1 patch (1.5 mg total) onto the skin every 3 (three) days.    No follow-ups on file.    Body mass index is 21.97 kg/m.:  Discussed the patient's BMI with patient. The BMI body mass index is 21.97 kg/m.     No future appointments.  Patient Instructions  Health Maintenance, Female Adopting a healthy lifestyle and getting preventive care can go a long way to promote health and wellness. Talk with your health care provider about what schedule of regular examinations is right for you. This is a good chance for you to check in with your provider about disease prevention and staying healthy. In between checkups, there are plenty of things you can do on your own. Experts have done a lot of research about which lifestyle changes and preventive measures are most likely to keep you healthy. Ask your health care provider for more information. Weight and diet Eat a healthy diet  Be sure to include plenty of vegetables, fruits, low-fat dairy products, and lean protein.  Do not eat a lot of foods high in solid fats, added sugars, or salt.  Get regular exercise. This is one of the most important things you can do for your health. ? Most adults should exercise for at least 150 minutes each week. The exercise should increase your heart rate and make you sweat (moderate-intensity exercise). ? Most adults should also do strengthening exercises at least twice a week. This is in addition to the  moderate-intensity exercise.  Maintain a healthy weight  Body mass index (BMI) is a measurement that can be used to identify possible weight problems. It estimates body fat based on height and weight. Your health care provider can help determine your BMI and help you achieve or maintain a healthy weight.  For females 71 years of age and older: ? A BMI below 18.5 is considered underweight. ? A BMI of 18.5 to 24.9 is normal. ? A BMI of 25 to 29.9 is considered overweight. ? A BMI of 30 and above is considered obese.  Watch levels of cholesterol and blood lipids  You should start having your blood tested for lipids and cholesterol at 28 years of age, then have this test every 5 years.  You may need to have your cholesterol levels checked more often if: ? Your lipid or cholesterol levels are high. ? You are older than 28 years of age. ? You are at high risk for heart disease.  Cancer screening Lung Cancer  Lung cancer screening is recommended for adults 62-23 years old who are at high risk for lung cancer because of a history of smoking.  A yearly low-dose CT scan of the lungs is recommended for people who: ? Currently smoke. ? Have quit within the past 15 years. ? Have at least a 30-pack-year history of smoking. A pack year is smoking an average of one pack of cigarettes a day for 1 year.  Yearly screening should continue until it has been 15 years since you quit.  Yearly screening should stop if you  develop a health problem that would prevent you from having lung cancer treatment.  Breast Cancer  Practice breast self-awareness. This means understanding how your breasts normally appear and feel.  It also means doing regular breast self-exams. Let your health care provider know about any changes, no matter how small.  If you are in your 20s or 30s, you should have a clinical breast exam (CBE) by a health care provider every 1-3 years as part of a regular health exam.  If you  are 74 or older, have a CBE every year. Also consider having a breast X-ray (mammogram) every year.  If you have a family history of breast cancer, talk to your health care provider about genetic screening.  If you are at high risk for breast cancer, talk to your health care provider about having an MRI and a mammogram every year.  Breast cancer gene (BRCA) assessment is recommended for women who have family members with BRCA-related cancers. BRCA-related cancers include: ? Breast. ? Ovarian. ? Tubal. ? Peritoneal cancers.  Results of the assessment will determine the need for genetic counseling and BRCA1 and BRCA2 testing.  Cervical Cancer Your health care provider may recommend that you be screened regularly for cancer of the pelvic organs (ovaries, uterus, and vagina). This screening involves a pelvic examination, including checking for microscopic changes to the surface of your cervix (Pap test). You may be encouraged to have this screening done every 3 years, beginning at age 67.  For women ages 58-65, health care providers may recommend pelvic exams and Pap testing every 3 years, or they may recommend the Pap and pelvic exam, combined with testing for human papilloma virus (HPV), every 5 years. Some types of HPV increase your risk of cervical cancer. Testing for HPV may also be done on women of any age with unclear Pap test results.  Other health care providers may not recommend any screening for nonpregnant women who are considered low risk for pelvic cancer and who do not have symptoms. Ask your health care provider if a screening pelvic exam is right for you.  If you have had past treatment for cervical cancer or a condition that could lead to cancer, you need Pap tests and screening for cancer for at least 20 years after your treatment. If Pap tests have been discontinued, your risk factors (such as having a new sexual partner) need to be reassessed to determine if screening should  resume. Some women have medical problems that increase the chance of getting cervical cancer. In these cases, your health care provider may recommend more frequent screening and Pap tests.  Colorectal Cancer  This type of cancer can be detected and often prevented.  Routine colorectal cancer screening usually begins at 28 years of age and continues through 28 years of age.  Your health care provider may recommend screening at an earlier age if you have risk factors for colon cancer.  Your health care provider may also recommend using home test kits to check for hidden blood in the stool.  A small camera at the end of a tube can be used to examine your colon directly (sigmoidoscopy or colonoscopy). This is done to check for the earliest forms of colorectal cancer.  Routine screening usually begins at age 28.  Direct examination of the colon should be repeated every 5-10 years through 28 years of age. However, you may need to be screened more often if early forms of precancerous polyps or small growths are found.  Skin Cancer  Check your skin from head to toe regularly.  Tell your health care provider about any new moles or changes in moles, especially if there is a change in a mole's shape or color.  Also tell your health care provider if you have a mole that is larger than the size of a pencil eraser.  Always use sunscreen. Apply sunscreen liberally and repeatedly throughout the day.  Protect yourself by wearing long sleeves, pants, a wide-brimmed hat, and sunglasses whenever you are outside.  Heart disease, diabetes, and high blood pressure  High blood pressure causes heart disease and increases the risk of stroke. High blood pressure is more likely to develop in: ? People who have blood pressure in the high end of the normal range (130-139/85-89 mm Hg). ? People who are overweight or obese. ? People who are African American.  If you are 21-42 years of age, have your blood  pressure checked every 3-5 years. If you are 64 years of age or older, have your blood pressure checked every year. You should have your blood pressure measured twice-once when you are at a hospital or clinic, and once when you are not at a hospital or clinic. Record the average of the two measurements. To check your blood pressure when you are not at a hospital or clinic, you can use: ? An automated blood pressure machine at a pharmacy. ? A home blood pressure monitor.  If you are between 66 years and 42 years old, ask your health care provider if you should take aspirin to prevent strokes.  Have regular diabetes screenings. This involves taking a blood sample to check your fasting blood sugar level. ? If you are at a normal weight and have a low risk for diabetes, have this test once every three years after 28 years of age. ? If you are overweight and have a high risk for diabetes, consider being tested at a younger age or more often. Preventing infection Hepatitis B  If you have a higher risk for hepatitis B, you should be screened for this virus. You are considered at high risk for hepatitis B if: ? You were born in a country where hepatitis B is common. Ask your health care provider which countries are considered high risk. ? Your parents were born in a high-risk country, and you have not been immunized against hepatitis B (hepatitis B vaccine). ? You have HIV or AIDS. ? You use needles to inject street drugs. ? You live with someone who has hepatitis B. ? You have had sex with someone who has hepatitis B. ? You get hemodialysis treatment. ? You take certain medicines for conditions, including cancer, organ transplantation, and autoimmune conditions.  Hepatitis C  Blood testing is recommended for: ? Everyone born from [birth year] through 1966. ? Anyone with known risk factors for hepatitis C.  Sexually transmitted infections (STIs)  You should be screened for sexually transmitted  infections (STIs) including gonorrhea and chlamydia if: ? You are sexually active and are younger than 28 years of age. ? You are older than 28 years of age and your health care provider tells you that you are at risk for this type of infection. ? Your sexual activity has changed since you were last screened and you are at an increased risk for chlamydia or gonorrhea. Ask your health care provider if you are at risk.  If you do not have HIV, but are at risk, it may be recommended that you  take a prescription medicine daily to prevent HIV infection. This is called pre-exposure prophylaxis (PrEP). You are considered at risk if: ? You are sexually active and do not regularly use condoms or know the HIV status of your partner(s). ? You take drugs by injection. ? You are sexually active with a partner who has HIV.  Talk with your health care provider about whether you are at high risk of being infected with HIV. If you choose to begin PrEP, you should first be tested for HIV. You should then be tested every 3 months for as long as you are taking PrEP. Pregnancy  If you are premenopausal and you may become pregnant, ask your health care provider about preconception counseling.  If you may become pregnant, take 400 to 800 micrograms (mcg) of folic acid every day.  If you want to prevent pregnancy, talk to your health care provider about birth control (contraception). Osteoporosis and menopause  Osteoporosis is a disease in which the bones lose minerals and strength with aging. This can result in serious bone fractures. Your risk for osteoporosis can be identified using a bone density scan.  If you are 6 years of age or older, or if you are at risk for osteoporosis and fractures, ask your health care provider if you should be screened.  Ask your health care provider whether you should take a calcium or vitamin D supplement to lower your risk for osteoporosis.  Menopause may have certain physical  symptoms and risks.  Hormone replacement therapy may reduce some of these symptoms and risks. Talk to your health care provider about whether hormone replacement therapy is right for you. Follow these instructions at home:  Schedule regular health, dental, and eye exams.  Stay current with your immunizations.  Do not use any tobacco products including cigarettes, chewing tobacco, or electronic cigarettes.  If you are pregnant, do not drink alcohol.  If you are breastfeeding, limit how much and how often you drink alcohol.  Limit alcohol intake to no more than 1 drink per day for nonpregnant women. One drink equals 12 ounces of beer, 5 ounces of wine, or 1 ounces of hard liquor.  Do not use street drugs.  Do not share needles.  Ask your health care provider for help if you need support or information about quitting drugs.  Tell your health care provider if you often feel depressed.  Tell your health care provider if you have ever been abused or do not feel safe at home. This information is not intended to replace advice given to you by your health care provider. Make sure you discuss any questions you have with your health care provider. Document Released: 01/02/2011 Document Revised: 11/25/2015 Document Reviewed: 03/23/2015 Elsevier Interactive Patient Education  [employer].

## 2017-11-08 MED FILL — TOPIRAMATE 25 MG TAB: 25 | 30 days supply | Qty: 60 | Fill #0

## 2017-11-08 MED FILL — FLUTICASONE PROP 50 MCG SPR: 50 | 30 days supply | Qty: 16 | Fill #1

## 2017-11-09 LAB — CBC
HEMATOCRIT: 34.6 % (ref 34.0–46.6)
HEMOGLOBIN: 11.2 g/dL (ref 11.1–15.9)
MCH: 26.6 pg (ref 26.6–33.0)
MCHC: 32.4 g/dL (ref 31.5–35.7)
MCV: 82 fL (ref 79–97)
Platelets: 265 10*3/uL (ref 150–379)
RBC: 4.21 x10E6/uL (ref 3.77–5.28)
RDW: 13.6 % (ref 12.3–15.4)
WBC: 6 10*3/uL (ref 3.4–10.8)

## 2017-11-09 LAB — RPR: RPR Ser Ql: REACTIVE — AB

## 2017-11-09 LAB — LIPID PANEL
Chol/HDL Ratio: 2.8 ratio (ref 0.0–4.4)
Cholesterol, Total: 108 mg/dL (ref 100–199)
HDL: 39 mg/dL — ABNORMAL LOW (ref >39)
LDL CALC: 58 mg/dL (ref 0–99)
Triglycerides: 57 mg/dL (ref 0–149)
VLDL Cholesterol Cal: 11 mg/dL (ref 5–40)

## 2017-11-09 LAB — RPR, QUANT+TP ABS (REFLEX): TREPONEMA PALLIDUM AB: NEGATIVE

## 2017-11-09 LAB — HIV ANTIBODY (ROUTINE TESTING W REFLEX): HIV SCREEN 4TH GENERATION: NONREACTIVE

## 2017-11-09 LAB — HEPATITIS B SURFACE ANTIGEN: Hepatitis B Surface Ag: NEGATIVE

## 2017-11-09 LAB — HEMOGLOBIN A1C
Est. average glucose Bld gHb Est-mCnc: 114 mg/dL
HEMOGLOBIN A1C: 5.6 % (ref 4.8–5.6)

## 2017-11-09 LAB — TSH: TSH: 0.617 u[IU]/mL (ref 0.450–4.500)

## 2017-11-10 MED ORDER — PENICILLIN G BENZATHINE 1200000 UNIT/2ML IM SUSP
2.4000 10*6.[IU] | Freq: Once | INTRAMUSCULAR | Status: AC
  Administered 2017-11-12: 2.4 10*6.[IU] via INTRAMUSCULAR

## 2017-11-10 NOTE — Progress Notes (Signed)
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 Pt notified. Pen 2.2million units orderd. Pt to come in for treatment.

## 2017-11-10 NOTE — Addendum Note (Signed)
Addended by: Collie Siad A on: 11/10/2017 06:26 AM   Modules accepted: Orders

## 2017-11-12 ENCOUNTER — Telehealth: Payer: Self-pay | Admitting: Family Medicine

## 2017-11-12 ENCOUNTER — Ambulatory Visit (INDEPENDENT_AMBULATORY_CARE_PROVIDER_SITE_OTHER): Payer: 59 | Admitting: Family Medicine

## 2017-11-12 ENCOUNTER — Ambulatory Visit (INDEPENDENT_AMBULATORY_CARE_PROVIDER_SITE_OTHER): Payer: Self-pay | Admitting: Nurse Practitioner

## 2017-11-12 VITALS — BP 115/70 | HR 90 | Temp 97.5°F | Resp 16 | Wt 143.0 lb

## 2017-11-12 DIAGNOSIS — J0140 Acute pansinusitis, unspecified: Secondary | ICD-10-CM

## 2017-11-12 DIAGNOSIS — A539 Syphilis, unspecified: Secondary | ICD-10-CM | POA: Diagnosis not present

## 2017-11-12 DIAGNOSIS — A530 Latent syphilis, unspecified as early or late: Secondary | ICD-10-CM

## 2017-11-12 MED ORDER — AMOXICILLIN-POT CLAVULANATE 875-125 MG PO TABS: 1.0000 | ORAL_TABLET | Freq: Two times a day (BID) | ORAL | 0 refills | Status: AC

## 2017-11-12 MED ORDER — PSEUDOEPH-BROMPHEN-DM 30-2-10 MG/5ML PO SYRP: 5.0000 mL | ORAL_SOLUTION | Freq: Four times a day (QID) | ORAL | 0 refills | Status: AC | PRN

## 2017-11-12 MED FILL — BROMPHENIR-PSEUDOEPHED-DM S: 30-2-10 | 7 days supply | Qty: 150 | Fill #0

## 2017-11-12 MED FILL — AMOX-CLAV 875-125 MG TABLET: 875-125 | 10 days supply | Qty: 20 | Fill #0

## 2017-11-12 NOTE — Patient Instructions (Signed)
Sinusitis, Adult Sinusitis is soreness and inflammation of your sinuses. Sinuses are hollow spaces in the bones around your face. Your sinuses are located:  Around your eyes.  In the middle of your forehead.  Behind your nose.  In your cheekbones.  Your sinuses and nasal passages are lined with a stringy fluid (mucus). Mucus normally drains out of your sinuses. When your nasal tissues become inflamed or swollen, the mucus can become trapped or blocked so air cannot flow through your sinuses. This allows bacteria, viruses, and funguses to grow, which leads to infection. Sinusitis can develop quickly and last for 7?10 days (acute) or for more than 12 weeks (chronic). Sinusitis often develops after a cold. What are the causes? This condition is caused by anything that creates swelling in the sinuses or stops mucus from draining, including:  Allergies.  Asthma.  Bacterial or viral infection.  Abnormally shaped bones between the nasal passages.  Nasal growths that contain mucus (nasal polyps).  Narrow sinus openings.  Pollutants, such as chemicals or irritants in the air.  A foreign object stuck in the nose.  A fungal infection. This is rare.  What increases the risk? The following factors may make you more likely to develop this condition:  Having allergies or asthma.  Having had a recent cold or respiratory tract infection.  Having structural deformities or blockages in your nose or sinuses.  Having a weak immune system.  Doing a lot of swimming or diving.  Overusing nasal sprays.  Smoking.  What are the signs or symptoms? The main symptoms of this condition are pain and a feeling of pressure around the affected sinuses. Other symptoms include:  Upper toothache.  Earache.  Headache.  Bad breath.  Decreased sense of smell and taste.  A cough that may get worse at night.  Fatigue.  Fever.  Thick drainage from your nose. The drainage is often green and  it may contain pus (purulent).  Stuffy nose or congestion.  Postnasal drip. This is when extra mucus collects in the throat or back of the nose.  Swelling and warmth over the affected sinuses.  Sore throat.  Sensitivity to light.  How is this diagnosed? This condition is diagnosed based on symptoms, a medical history, and a physical exam. To find out if your condition is acute or chronic, your health care provider may:  Look in your nose for signs of nasal polyps.  Tap over the affected sinus to check for signs of infection.  View the inside of your sinuses using an imaging device that has a light attached (endoscope).  If your health care provider suspects that you have chronic sinusitis, you may also:  Be tested for allergies.  Have a sample of mucus taken from your nose (nasal culture) and checked for bacteria.  Have a mucus sample examined to see if your sinusitis is related to an allergy.  If your sinusitis does not respond to treatment and it lasts longer than 8 weeks, you may have an MRI or CT scan to check your sinuses. These scans also help to determine how severe your infection is. In rare cases, a bone biopsy may be done to rule out more serious types of fungal sinus disease. How is this treated? Treatment for sinusitis depends on the cause and whether your condition is chronic or acute. If a virus is causing your sinusitis, your symptoms will go away on their own within 10 days. You may be given medicines to relieve your symptoms,   including:  Topical nasal decongestants. They shrink swollen nasal passages and let mucus drain from your sinuses.  Antihistamines. These drugs block inflammation that is triggered by allergies. This can help to ease swelling in your nose and sinuses.  Topical nasal corticosteroids. These are nasal sprays that ease inflammation and swelling in your nose and sinuses.  Nasal saline washes. These rinses can help to get rid of thick mucus in  your nose.  If your condition is caused by bacteria, you will be given an antibiotic medicine. If your condition is caused by a fungus, you will be given an antifungal medicine. Surgery may be needed to correct underlying conditions, such as narrow nasal passages. Surgery may also be needed to remove polyps. Follow these instructions at home: Medicines  Take, use, or apply over-the-counter and prescription medicines only as told by your health care provider. These may include nasal sprays.  If you were prescribed an antibiotic medicine, take it as told by your health care provider. Do not stop taking the antibiotic even if you start to feel better. Hydrate and Humidify  Drink enough water to keep your urine clear or pale yellow. Staying hydrated will help to thin your mucus.  Use a cool mist humidifier to keep the humidity level in your home above 50%.  Inhale steam for 10-15 minutes, 3-4 times a day or as told by your health care provider. You can do this in the bathroom while a hot shower is running.  Limit your exposure to cool or dry air. Rest  Rest as much as possible.  Sleep with your head raised (elevated).  Make sure to get enough sleep each night. General instructions  Apply a warm, moist washcloth to your face 3-4 times a day or as told by your health care provider. This will help with discomfort.  Wash your hands often with soap and water to reduce your exposure to viruses and other germs. If soap and water are not available, use hand sanitizer.  Do not smoke. Avoid being around people who are smoking (secondhand smoke).  Keep all follow-up visits as told by your health care provider. This is important. Contact a health care provider if:  You have a fever.  Your symptoms get worse.  Your symptoms do not improve within 10 days. Get help right away if:  You have a severe headache.  You have persistent vomiting.  You have pain or swelling around your face or  eyes.  You have vision problems.  You develop confusion.  Your neck is stiff.  You have trouble breathing. This information is not intended to replace advice given to you by your health care provider. Make sure you discuss any questions you have with your health care provider. Document Released: 06/19/2005 Document Revised: 02/13/2016 Document Reviewed: 04/14/2015 Elsevier Interactive Patient Education  2018 [employer] Cough, Adult Coughing is a reflex that clears your throat and your airways. Coughing helps to heal and protect your lungs. It is normal to cough occasionally, but a cough that happens with other symptoms or lasts a long time may be a sign of a condition that needs treatment. A cough may last only 2-3 weeks (acute), or it may last longer than 8 weeks (chronic). What are the causes? Coughing is commonly caused by:  Breathing in substances that irritate your lungs.  A viral or bacterial respiratory infection.  Allergies.  Asthma.  Postnasal drip.  Smoking.  Acid backing up from the stomach into the esophagus (gastroesophageal reflux).    Certain medicines.  Chronic lung problems, including COPD (or rarely, lung cancer).  Other medical conditions such as heart failure.  Follow these instructions at home: Pay attention to any changes in your symptoms. Take these actions to help with your discomfort:  Take medicines only as told by your health care provider. ? If you were prescribed an antibiotic medicine, take it as told by your health care provider. Do not stop taking the antibiotic even if you start to feel better. ? Talk with your health care provider before you take a cough suppressant medicine.  Drink enough fluid to keep your urine clear or pale yellow.  If the air is dry, use a cold steam vaporizer or humidifier in your bedroom or your home to help loosen secretions.  Avoid anything that causes you to cough at work or at home.  If your cough is  worse at night, try sleeping in a semi-upright position.  Avoid cigarette smoke. If you smoke, quit smoking. If you need help quitting, ask your health care provider.  Avoid caffeine.  Avoid alcohol.  Rest as needed.  Contact a health care provider if:  You have new symptoms.  You cough up pus.  Your cough does not get better after 2-3 weeks, or your cough gets worse.  You cannot control your cough with suppressant medicines and you are losing sleep.  You develop pain that is getting worse or pain that is not controlled with pain medicines.  You have a fever.  You have unexplained weight loss.  You have night sweats. Get help right away if:  You cough up blood.  You have difficulty breathing.  Your heartbeat is very fast. This information is not intended to replace advice given to you by your health care provider. Make sure you discuss any questions you have with your health care provider. Document Released: 12/16/2010 Document Revised: 11/25/2015 Document Reviewed: 08/26/2014 Elsevier Interactive Patient Education  2018 [employer] Inc[employer]  

## 2017-11-12 NOTE — Progress Notes (Signed)
Administrations This Visit    penicillin g benzathine (BICILLIN LA) 1200000 UNIT/2ML injection 2.4 Million Units    Admin Date 11/12/2017 Action Given Dose 2.4 Million Units Route Intramuscular Administered By Oneida Alar, RN         Patient seen for injection-only appointment, did not see provider.

## 2017-11-12 NOTE — Progress Notes (Signed)
Subjective:  Connie Cobb is a 27 y.o. female who presents for evaluation of possible sinusitis.  Symptoms include bilateral ear pressure/pain, fever: fevers up to 104 degrees, headache described as dull, post nasal drip, productive cough with green colored sputum, sinus pressure, sinus pain and sore throat.  Onset of symptoms was 8 weeks ago, and has been unchanged since that time.  Patient was seen in our office on 4 8, at which time she was given Zyrtec and Flonase for her symptoms.  Patient states she also took Singulair during that time.  Patient states over the last week her symptoms returned with worsening.  Treatment to date:  antihistamines, ibuprofen, nasal steroids and Singulair.  High risk factors for influenza complications:  none.  The following portions of the patient's history were reviewed and updated as appropriate:  allergies, current medications and past medical history.  Constitutional: positive for anorexia, fatigue and fevers, negative for chills, malaise and sweats Eyes: negative Ears, nose, mouth, throat, and face: positive for nasal congestion, sore throat and ear fullness/pressure, negative for ear drainage, earaches and hoarseness Respiratory: positive for cough, sputum and wheezing, negative for asthma, chronic bronchitis, dyspnea on exertion and stridor Cardiovascular: negative Gastrointestinal: negative except for decreased appetite Neurological: positive for headaches, negative for coordination problems, dizziness, gait problems, paresthesia, tremors, vertigo and weakness Allergic/Immunologic: positive for hay fever Objective:  BP 115/70 (BP Location: Right Arm, Patient Position: Sitting, Cuff Size: Normal)   Pulse 90   Temp (!) 97.5 F (36.4 C) (Oral)   Resp 16   Wt 143 lb (64.9 kg)   LMP 11/03/2017   SpO2 99%   BMI 22.07 kg/m  General appearance: alert, cooperative, fatigued and no distress Head: Normocephalic, without obvious abnormality, atraumatic Eyes:  conjunctivae/corneas clear. PERRL, EOM's intact. Fundi benign. Ears: abnormal TM right ear - mucoid middle ear fluid and abnormal TM left ear - mucoid middle ear fluid Nose: clear discharge, moderate congestion, turbinates swollen, inflamed, severe maxillary sinus tenderness bilateral, severe frontal sinus tenderness bilateral Throat: lips, mucosa, and tongue normal; teeth and gums normal Lungs: clear to auscultation bilaterally Heart: regular rate and rhythm, S1, S2 normal, no murmur, click, rub or gallop Abdomen: soft, non-tender; bowel sounds normal; no masses,  no organomegaly Pulses: 2+ and symmetric Skin: Skin color, texture, turgor normal. No rashes or lesions Lymph nodes: cervical and submandibular nodes normal Neurologic: Grossly normal    Assessment:  Acute Pansinusitis    Plan:  Discussed diagnosis and treatment of sinusitis. Educational material distributed and questions answered. Suggested symptomatic OTC remedies. Suggested brief course of Afrin or similar. Supportive care with appropriate antipyretics and fluids. Nasal saline spray for congestion. Augmentin, Bromfed per orders. Nasal steroids per orders. Follow up as needed. Meds ordered this encounter  Medications  . amoxicillin-clavulanate (AUGMENTIN) 875-125 MG tablet    Sig: Take 1 tablet by mouth 2 (two) times daily for 10 days.    Dispense:  20 tablet    Refill:  0    Order Specific Question:   Supervising Provider    Answer:   Stacie Glaze [[id number]]  . brompheniramine-pseudoephedrine-DM 30-2-10 MG/5ML syrup    Sig: Take 5 mLs by mouth 4 (four) times daily as needed for up to 7 days.    Dispense:  150 mL    Refill:  0    Order Specific Question:   Supervising Provider    Answer:   Stacie Glaze [phone]

## 2017-11-12 NOTE — Telephone Encounter (Signed)
Patient seen for injection only visit today.  Provider, please sign pended labwork if agreeable with patient coming in for lab-only visit in 30 days.  If she needs office visit, please note that she will need to come in and forward to scheduling pool.

## 2017-11-13 NOTE — Telephone Encounter (Signed)
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 She understands this plan.

## 2017-11-14 ENCOUNTER — Telehealth: Payer: Self-pay

## 2017-11-14 NOTE — Telephone Encounter (Signed)
Patient states she is less congested and she is doing better.

## 2017-11-15 ENCOUNTER — Ambulatory Visit: Payer: 59 | Admitting: Family Medicine

## 2017-12-11 MED FILL — TOPIRAMATE 25 MG TAB: 25 | 30 days supply | Qty: 60 | Fill #1

## 2018-01-17 MED FILL — TOPIRAMATE 25 MG TAB: 25 | 30 days supply | Qty: 60 | Fill #2

## 2018-02-18 MED FILL — TOPIRAMATE 25 MG TAB: 25 | 30 days supply | Qty: 60 | Fill #3

## 2018-03-01 ENCOUNTER — Encounter: Payer: Self-pay | Admitting: Obstetrics

## 2018-03-01 ENCOUNTER — Ambulatory Visit (INDEPENDENT_AMBULATORY_CARE_PROVIDER_SITE_OTHER): Payer: 59 | Admitting: Obstetrics

## 2018-03-01 VITALS — BP 103/64 | HR 78 | Ht 66.0 in | Wt 135.6 lb

## 2018-03-01 DIAGNOSIS — N898 Other specified noninflammatory disorders of vagina: Secondary | ICD-10-CM

## 2018-03-01 DIAGNOSIS — Z124 Encounter for screening for malignant neoplasm of cervix: Secondary | ICD-10-CM | POA: Diagnosis not present

## 2018-03-01 DIAGNOSIS — Z113 Encounter for screening for infections with a predominantly sexual mode of transmission: Secondary | ICD-10-CM | POA: Diagnosis not present

## 2018-03-01 DIAGNOSIS — Z01419 Encounter for gynecological examination (general) (routine) without abnormal findings: Secondary | ICD-10-CM | POA: Diagnosis not present

## 2018-03-01 DIAGNOSIS — Z3041 Encounter for surveillance of contraceptive pills: Secondary | ICD-10-CM

## 2018-03-01 MED ORDER — NORETHINDRONE 0.35 MG PO TABS: 1.0000 | ORAL_TABLET | Freq: Every day | ORAL | 12 refills | Status: DC

## 2018-03-01 MED FILL — NORETHINDRONE 0.35 MG TAB: 0.35 | 84 days supply | Qty: 84 | Fill #0

## 2018-03-01 NOTE — Progress Notes (Signed)
Subjective:        Connie Cobb is a 27 y.o. female here for a routine exam.  Current complaints: nONE.    Personal health questionnaire:  Is patient Ashkenazi Jewish, have a family history of breast and/or ovarian cancer: no Is there a family history of uterine cancer diagnosed at age < 83, gastrointestinal cancer, urinary tract cancer, family member who is a Personnel officer syndrome-associated carrier: no Is the patient overweight and hypertensive, family history of diabetes, personal history of gestational diabetes, preeclampsia or PCOS: no Is patient over 62, have PCOS,  family history of premature CHD under age 20, diabetes, smoke, have hypertension or peripheral artery disease:  no At any time, has a partner hit, kicked or otherwise hurt or frightened you?: no Over the past 2 weeks, have you felt down, depressed or hopeless?: no Over the past 2 weeks, have you felt little interest or pleasure in doing things?:no   Gynecologic History Patient's last menstrual period was 02/01/2018. Contraception: oral progesterone-only contraceptive Last Pap: 2018. Results were: ASCUS with negative High Risk HPV Last mammogram: n/a. Results were: n/a  Obstetric History OB History  Gravida Para Term Preterm AB Living  2 1 1   1 1   SAB TAB Ectopic Multiple Live Births        0 1    # Outcome Date GA Lbr Len/2nd Weight Sex Delivery Anes PTL Lv  2 Term 01/27/16 [redacted]w[redacted]d 19:09 / 00:30 8 lb 4.5 oz (3.755 kg) M Vag-Spont None  LIV     Birth Comments: Skin tear on scalp  1 AB 06/02/08            Past Medical History:  Diagnosis Date  . Allergy   . Anemia   . GERD (gastroesophageal reflux disease)   . Migraine     Past Surgical History:  Procedure Laterality Date  . TONSILLECTOMY       Current Outpatient Medications:  .  norethindrone (NORLYDA) 0.35 MG tablet, Take 1 tablet (0.35 mg total) by mouth daily., Disp: 28 tablet, Rfl: 12 .  topiramate (TOPAMAX) 25 MG tablet, Take 1 tablet (25 mg total)  by mouth 2 (two) times daily., Disp: 60 tablet, Rfl: 3 .  amphetamine-dextroamphetamine (ADDERALL) 5 MG tablet, Take 1 tablet (5 mg total) by mouth daily. (Patient not taking: Reported on 11/12/2017), Disp: 30 tablet, Rfl: 0 .  aspirin-acetaminophen-caffeine (EXCEDRIN MIGRAINE) 250-250-65 MG tablet, Take 1 tablet by mouth every 6 (six) hours as needed for headache., Disp: , Rfl:  .  cetirizine (ZYRTEC) 10 MG tablet, Take 1 tablet (10 mg total) by mouth daily. (Patient not taking: Reported on 03/01/2018), Disp: 30 tablet, Rfl: 0 .  diphenhydrAMINE (BENADRYL) 25 mg capsule, Take 25 mg by mouth every 6 (six) hours as needed., Disp: , Rfl:  .  fluticasone (FLONASE) 50 MCG/ACT nasal spray, Place 2 sprays into both nostrils daily. (Patient not taking: Reported on 03/01/2018), Disp: 16 g, Rfl: 6 .  guaiFENesin (ROBITUSSIN) 100 MG/5ML liquid, Take 200 mg by mouth 3 (three) times daily as needed for cough., Disp: , Rfl:  .  ibuprofen (ADVIL,MOTRIN) 200 MG tablet, Take 800 mg by mouth every 6 (six) hours as needed for headache., Disp: , Rfl:  .  ondansetron (ZOFRAN) 4 MG tablet, Take 1 tablet (4 mg total) by mouth every 8 (eight) hours as needed for nausea or vomiting. (Patient not taking: Reported on 03/01/2018), Disp: 20 tablet, Rfl: 0 .  Prenat-Fe Poly-Methfol-FA-DHA (VITAFOL ULTRA) 29-0.6-0.4-200 MG CAPS,  Take 1 capsule by mouth daily before breakfast. (Patient not taking: Reported on 03/01/2018), Disp: 30 capsule, Rfl: 11 .  scopolamine (TRANSDERM-SCOP, 1.5 MG,) 1 MG/3DAYS, Place 1 patch (1.5 mg total) onto the skin every 3 (three) days. (Patient not taking: Reported on 03/01/2018), Disp: 4 patch, Rfl: 0 Allergies  Allergen Reactions  . Banana Anaphylaxis  . Sulfonamide Derivatives Hives and Shortness Of Breath    Social History   Tobacco Use  . Smoking status: Never Smoker  . Smokeless tobacco: Never Used  Substance Use Topics  . Alcohol use: No    Alcohol/week: 0.0 standard drinks    Family History   Problem Relation Age of Onset  . Diabetes Father   . Hyperlipidemia Father   . Hypertension Father   . Hypertension Maternal Grandmother   . Cancer Maternal Grandfather   . Heart disease Maternal Grandfather   . Hypertension Maternal Grandfather   . Cancer Paternal Grandmother   . Diabetes Unknown        grandparent  . Breast cancer Unknown        grandmother      Review of Systems  Constitutional: negative for fatigue and weight loss Respiratory: negative for cough and wheezing Cardiovascular: negative for chest pain, fatigue and palpitations Gastrointestinal: negative for abdominal pain and change in bowel habits Musculoskeletal:negative for myalgias Neurological: negative for gait problems and tremors Behavioral/Psych: negative for abusive relationship, depression Endocrine: negative for temperature intolerance    Genitourinary:negative for abnormal menstrual periods, genital lesions, hot flashes, sexual problems and vaginal discharge Integument/breast: negative for breast lump, breast tenderness, nipple discharge and skin lesion(s)    Objective:       BP 103/64   Pulse 78   Ht 5\' 6"  (1.676 m)   Wt 135 lb 9.6 oz (61.5 kg)   LMP 02/01/2018   BMI 21.89 kg/m  General:   alert  Skin:   no rash or abnormalities  Lungs:   clear to auscultation bilaterally  Heart:   regular rate and rhythm, S1, S2 normal, no murmur, click, rub or gallop  Breasts:   normal without suspicious masses, skin or nipple changes or axillary nodes  Abdomen:  normal findings: no organomegaly, soft, non-tender and no hernia  Pelvis:  External genitalia: normal general appearance Urinary system: urethral meatus normal and bladder without fullness, nontender Vaginal: normal without tenderness, induration or masses Cervix: normal appearance Adnexa: normal bimanual exam Uterus: anteverted and non-tender, normal size   Lab Review Urine pregnancy test Labs reviewed yes Radiologic studies reviewed  no  50% of 20 min visit spent on counseling and coordination of care.   Assessment:     1. Encounter for routine gynecological examination with Papanicolaou smear of cervix Rx: - Cytology - PAP  2. Vaginal discharge Rx: - Cervicovaginal ancillary only  3. Screening for STD (sexually transmitted disease) Rx: - HIV antibody - RPR - Hepatitis B Surface AntiGEN - Hepatitis C Antibody  4. Encounter for surveillance of contraceptive pills Rx: - norethindrone (NORLYDA) 0.35 MG tablet; Take 1 tablet (0.35 mg total) by mouth daily.  Dispense: 28 tablet; Refill: 12 - has a history of migraines with aura.  She is considering changing to ParaGuard IUD   Plan:    Education reviewed: calcium supplements, depression evaluation, low fat, low cholesterol diet, safe sex/STD prevention, self breast exams and weight bearing exercise. Contraception: oral progesterone-only contraceptive. Follow up in: 1 year.   Meds ordered this encounter  Medications  . norethindrone (NORLYDA) 0.35  MG tablet    Sig: Take 1 tablet (0.35 mg total) by mouth daily.    Dispense:  28 tablet    Refill:  12   Orders Placed This Encounter  Procedures  . HIV antibody  . RPR  . Hepatitis B Surface AntiGEN  . Hepatitis C Antibody    Brock BadHARLES A. HARPER MD 03-01-2018

## 2018-03-01 NOTE — Progress Notes (Signed)
Patient is in the office for annual, last pap 02-28-17. Pt is currently on BC pills.

## 2018-03-05 ENCOUNTER — Ambulatory Visit (INDEPENDENT_AMBULATORY_CARE_PROVIDER_SITE_OTHER): Payer: 59 | Admitting: Obstetrics

## 2018-03-05 ENCOUNTER — Other Ambulatory Visit: Payer: Self-pay | Admitting: Obstetrics

## 2018-03-05 ENCOUNTER — Encounter: Payer: Self-pay | Admitting: *Deleted

## 2018-03-05 ENCOUNTER — Encounter: Payer: Self-pay | Admitting: Obstetrics

## 2018-03-05 VITALS — BP 123/71 | HR 83 | Wt 136.6 lb

## 2018-03-05 DIAGNOSIS — Z3041 Encounter for surveillance of contraceptive pills: Secondary | ICD-10-CM

## 2018-03-05 DIAGNOSIS — Z30430 Encounter for insertion of intrauterine contraceptive device: Secondary | ICD-10-CM | POA: Diagnosis not present

## 2018-03-05 DIAGNOSIS — Z3202 Encounter for pregnancy test, result negative: Secondary | ICD-10-CM

## 2018-03-05 LAB — POCT URINE PREGNANCY: Preg Test, Ur: NEGATIVE

## 2018-03-05 MED ORDER — PARAGARD INTRAUTERINE COPPER IU IUD
1.0000 | INTRAUTERINE_SYSTEM | Freq: Once | INTRAUTERINE | Status: AC
  Administered 2018-03-05: 1 via INTRAUTERINE

## 2018-03-05 NOTE — Progress Notes (Addendum)
IUD Insertion Procedure Note  Pre-operative Diagnosis: Desire Long Term Reversible Contraception ( LARC )  Post-operative Diagnosis: same  Indications: contraception  Procedure Details  Urine pregnancy test was done in office and result was negative.  The risks (including infection, bleeding, pain, and uterine perforation) and benefits of the procedure were explained to the patient and Written informed consent was obtained.    Cervix cleansed with Betadine. Uterus sounded to 8 cm. IUD inserted without difficulty. String visible and trimmed. Patient tolerated procedure well.  IUD Information: ParaGard. Lot #:  [id number]           Exp Date:  JUL  2025  Condition: Stable  Complications: None  Plan:  The patient was advised to call for any fever or for prolonged or severe pain or bleeding. She was advised to use NSAID as needed for mild to moderate pain.   Attending Physician Documentation: I was present for or participated in the entire procedure, including opening and closing.  Brock Bad MD 03-05-2018

## 2018-03-05 NOTE — Progress Notes (Signed)
Pt is here for IUD insertion (paragard). UPT neg

## 2018-03-06 LAB — CERVICOVAGINAL ANCILLARY ONLY
BACTERIAL VAGINITIS: NEGATIVE
CANDIDA VAGINITIS: NEGATIVE
CHLAMYDIA, DNA PROBE: NEGATIVE
Neisseria Gonorrhea: NEGATIVE
Trichomonas: NEGATIVE

## 2018-03-06 LAB — RPR: RPR: REACTIVE — AB

## 2018-03-06 LAB — HEPATITIS B SURFACE ANTIGEN: Hepatitis B Surface Ag: NEGATIVE

## 2018-03-06 LAB — RPR, QUANT+TP ABS (REFLEX): T Pallidum Abs: NEGATIVE

## 2018-03-06 LAB — HEPATITIS C ANTIBODY: Hep C Virus Ab: <0.1 s/co ratio (ref 0.0–0.9)

## 2018-03-06 LAB — HIV ANTIBODY (ROUTINE TESTING W REFLEX): HIV SCREEN 4TH GENERATION: NONREACTIVE

## 2018-03-07 LAB — CYTOLOGY - PAP: Diagnosis: NEGATIVE

## 2018-03-13 ENCOUNTER — Other Ambulatory Visit: Payer: Self-pay | Admitting: Family Medicine

## 2018-03-18 MED ORDER — AMPHETAMINE-DEXTROAMPHETAMINE 5 MG PO TABS: 5.0000 mg | ORAL_TABLET | Freq: Every day | ORAL | 0 refills | Status: DC

## 2018-03-18 MED FILL — DEXTROAMP-AMPHETAMINE 5 MG: 5 | 30 days supply | Qty: 30 | Fill #0

## 2018-04-16 ENCOUNTER — Ambulatory Visit: Payer: 59 | Admitting: Obstetrics

## 2018-04-17 ENCOUNTER — Ambulatory Visit (INDEPENDENT_AMBULATORY_CARE_PROVIDER_SITE_OTHER): Payer: 59 | Admitting: Obstetrics

## 2018-04-17 ENCOUNTER — Encounter: Payer: Self-pay | Admitting: Obstetrics

## 2018-04-17 VITALS — BP 130/72 | HR 91 | Ht 67.0 in | Wt 136.8 lb

## 2018-04-17 DIAGNOSIS — Z30431 Encounter for routine checking of intrauterine contraceptive device: Secondary | ICD-10-CM

## 2018-04-17 NOTE — Progress Notes (Signed)
Subjective:    Connie Cobb is a 27 y.o. female who presents for contraception counseling. The patient has no complaints today. The patient is sexually active. Pertinent past medical history: none.  The information documented in the HPI was reviewed and verified.  Menstrual History: OB History    Gravida  2   Para  1   Term  1   Preterm      AB  1   Living  1     SAB      TAB      Ectopic      Multiple  0   Live Births  1            Patient's last menstrual period was 03/26/2018.   Patient Active Problem List   Diagnosis Date Noted  . Ringing in right ear 06/15/2014  . Inattention 01/23/2013  . Migraine, unspecified, without mention of intractable migraine without mention of status migrainosus 01/23/2013  . Chlamydia 12/22/2011  . GERD (gastroesophageal reflux disease) 10/29/2011  . Sinusitis 08/21/2011  . Allergic rhinitis, seasonal 08/21/2011  . UTI (lower urinary tract infection) 08/21/2011  . Screening for malignant neoplasm of the cervix 05/22/2011  . Routine gynecological examination 05/22/2011  . Family history of breast cancer 05/22/2011  . Contact with or exposure to other viral diseases(V01.79) 12/01/2010  . Anxiety 12/01/2010  . RINGWORM 08/01/2010  . VAGINITIS 08/01/2010  . ANEMIA-NOS 05/19/2010   Past Medical History:  Diagnosis Date  . Allergy   . Anemia   . GERD (gastroesophageal reflux disease)   . Migraine     Past Surgical History:  Procedure Laterality Date  . TONSILLECTOMY       Current Outpatient Medications:  .  amphetamine-dextroamphetamine (ADDERALL) 5 MG tablet, Take 1 tablet (5 mg total) by mouth daily., Disp: 30 tablet, Rfl: 0 .  aspirin-acetaminophen-caffeine (EXCEDRIN MIGRAINE) 250-250-65 MG tablet, Take 1 tablet by mouth every 6 (six) hours as needed for headache., Disp: , Rfl:  .  cetirizine (ZYRTEC) 10 MG tablet, Take 1 tablet (10 mg total) by mouth daily., Disp: 30 tablet, Rfl: 0 .  diphenhydrAMINE (BENADRYL) 25  mg capsule, Take 25 mg by mouth every 6 (six) hours as needed., Disp: , Rfl:  .  ibuprofen (ADVIL,MOTRIN) 200 MG tablet, Take 800 mg by mouth every 6 (six) hours as needed for headache., Disp: , Rfl:  .  ondansetron (ZOFRAN) 4 MG tablet, Take 1 tablet (4 mg total) by mouth every 8 (eight) hours as needed for nausea or vomiting., Disp: 20 tablet, Rfl: 0 .  scopolamine (TRANSDERM-SCOP, 1.5 MG,) 1 MG/3DAYS, Place 1 patch (1.5 mg total) onto the skin every 3 (three) days., Disp: 4 patch, Rfl: 0 .  topiramate (TOPAMAX) 25 MG tablet, Take 1 tablet (25 mg total) by mouth 2 (two) times daily., Disp: 60 tablet, Rfl: 3 Allergies  Allergen Reactions  . Banana Anaphylaxis  . Sulfonamide Derivatives Hives and Shortness Of Breath    Social History   Tobacco Use  . Smoking status: Never Smoker  . Smokeless tobacco: Never Used  Substance Use Topics  . Alcohol use: No    Alcohol/week: 0.0 standard drinks    Family History  Problem Relation Age of Onset  . Diabetes Father   . Hyperlipidemia Father   . Hypertension Father   . Hypertension Maternal Grandmother   . Cancer Maternal Grandfather   . Heart disease Maternal Grandfather   . Hypertension Maternal Grandfather   . Cancer Paternal Grandmother   .  Diabetes Unknown        grandparent  . Breast cancer Unknown        grandmother       Review of Systems Constitutional: negative for weight loss Genitourinary:negative for abnormal menstrual periods and vaginal discharge   Objective:   BP 130/72   Pulse 91   Ht 5\' 7"  (1.702 m)   Wt 136 lb 12.8 oz (62.1 kg)   LMP 03/26/2018   BMI 21.43 kg/m           PE:          General:  Alert and no distress Abdomen:  normal findings: no organomegaly, soft, non-tender and no hernia  Pelvis:  External genitalia: normal general appearance Urinary system: urethral meatus normal and bladder without fullness, nontender Vaginal: normal without tenderness, induration or masses Cervix: normal appearance.   IUD string visible and normal length Adnexa: normal bimanual exam Uterus: anteverted and non-tender, normal size   Lab Review Urine pregnancy test Labs reviewed yes Radiologic studies reviewed no  50% of 15 min visit spent on counseling and coordination of care.    Assessment:    27 y.o., continuing ParaGuard IUD, no contraindications.   Plan:    All questions answered. Contraception: IUD. Follow up as needed.   No orders of the defined types were placed in this encounter.  No orders of the defined types were placed in this encounter.   Brock Bad MD 04-17-2018

## 2018-04-17 NOTE — Progress Notes (Signed)
Pt presents for IUD string check. Pt has no concerns

## 2018-04-20 ENCOUNTER — Encounter: Payer: Self-pay | Admitting: Family Medicine

## 2018-04-24 ENCOUNTER — Other Ambulatory Visit: Payer: Self-pay | Admitting: Family Medicine

## 2018-04-24 DIAGNOSIS — G43009 Migraine without aura, not intractable, without status migrainosus: Secondary | ICD-10-CM

## 2018-04-24 DIAGNOSIS — Z87898 Personal history of other specified conditions: Secondary | ICD-10-CM

## 2018-04-24 NOTE — Telephone Encounter (Signed)
Requested medication (s) are due for refill today: yes  Requested medication (s) are on the active medication list: yes    Last refill: 11/07/17  Future visit scheduled no  Notes to clinic:Zofran not delegated    Transderm-Scop. off protocol  Requested Prescriptions  Pending Prescriptions Disp Refills   ondansetron (ZOFRAN) 4 MG tablet [Pharmacy Med Name: ONDANSETRON HCL 4 MG TABLET 4 TAB] 20 tablet 0    Sig: TAKE 1 TABLET BY MOUTH EVERY 8 HOURS AS NEEDED FOR NAUSEA OR VOMITING.     Not Delegated - Gastroenterology: Antiemetics Failed - 04/24/2018  4:37 PM      Failed - This refill cannot be delegated      Passed - Valid encounter within last 6 months    Recent Outpatient Visits          5 months ago Syphilis   Primary Care at [facility], Manus Rudd, MD   5 months ago Encounter for health maintenance examination in adult   Primary Care at [facility], [place] A, MD   8 months ago Attention deficit hyperactivity disorder (ADHD), predominantly inattentive type   Primary Care at [facility], Manus Rudd, MD   1 year ago Attention deficit hyperactivity disorder (ADHD), predominantly inattentive type   Primary Care at [facility], Manus Rudd, MD   1 year ago Encounter for health maintenance examination   Primary Care at [facility], Zoe A, MD            TRANSDERM-SCOP, 1.5 MG, 1 MG/3DAYS [Pharmacy Med Name: TRANSDERM-SCOP 1.5 MG/72HR 1 PTCH] 4 patch 0    Sig: PLACE 1 PATCH ONTO THE SKIN EVERY 3 DAYS.     Off-Protocol Failed - 04/24/2018  4:37 PM      Failed - Medication not assigned to a protocol, review manually.      Passed - Valid encounter within last 12 months    Recent Outpatient Visits          5 months ago Syphilis   Primary Care at [facility], Manus Rudd, MD   5 months ago Encounter for health maintenance examination in adult   Primary Care at [facility], [place] A, MD   8 months ago Attention deficit hyperactivity disorder (ADHD), predominantly  inattentive type   Primary Care at [facility], Manus Rudd, MD   1 year ago Attention deficit hyperactivity disorder (ADHD), predominantly inattentive type   Primary Care at [facility], Manus Rudd, MD   1 year ago Encounter for health maintenance examination   Primary Care at [facility], Manus Rudd, MD

## 2018-04-25 ENCOUNTER — Other Ambulatory Visit: Payer: Self-pay

## 2018-04-25 ENCOUNTER — Ambulatory Visit: Payer: 59 | Admitting: Physician Assistant

## 2018-04-25 ENCOUNTER — Encounter: Payer: Self-pay | Admitting: Physician Assistant

## 2018-04-25 DIAGNOSIS — G43009 Migraine without aura, not intractable, without status migrainosus: Secondary | ICD-10-CM | POA: Diagnosis not present

## 2018-04-25 DIAGNOSIS — Z87898 Personal history of other specified conditions: Secondary | ICD-10-CM | POA: Diagnosis not present

## 2018-04-25 MED ORDER — TOPIRAMATE 25 MG PO TABS: 25.0000 mg | ORAL_TABLET | Freq: Two times a day (BID) | ORAL | 1 refills | Status: DC

## 2018-04-25 MED ORDER — SCOPOLAMINE 1 MG/3DAYS TD PT72: 1.0000 | MEDICATED_PATCH | TRANSDERMAL | 0 refills | Status: DC

## 2018-04-25 MED ORDER — ONDANSETRON HCL 4 MG PO TABS: 4.0000 mg | ORAL_TABLET | Freq: Three times a day (TID) | ORAL | 0 refills | Status: DC | PRN

## 2018-04-25 MED FILL — ONDANSETRON HCL 4 MG TABLET: 4 | 7 days supply | Qty: 20 | Fill #0

## 2018-04-25 MED FILL — SCOPOLAMINE 1 MG/3DAYS PT72: 1 | 12 days supply | Qty: 4 | Fill #0

## 2018-04-25 MED FILL — TOPIRAMATE 25 MG TAB: 25 | 90 days supply | Qty: 180 | Fill #0

## 2018-04-25 NOTE — Patient Instructions (Addendum)
For topamax, start 25mg  daily x 1 week. Increase to 25mg  twice daily. After that can increase by 25mg  each week until symptoms controlled or you meet a max of 50 mg twice daily. Follow up with Dr. Creta Levin as planned.     If you have lab work done today you will be contacted with your lab results within the next 2 weeks.  If you have not heard from [place] then please contact us. The fastest way to get your results is to register for My Chart.   IF you received an x-ray today, you will receive an invoice from [facility]. Please contact [facility] at [phone] with questions or concerns regarding your invoice.   IF you received labwork today, you will receive an invoice from [place]. Please contact [facility] at [phone] with questions or concerns regarding your invoice.   Our billing staff will not be able to assist you with questions regarding bills from these companies.  You will be contacted with the lab results as soon as they are available. The fastest way to get your results is to activate your My Chart account. Instructions are located on the last page of this paperwork. If you have not heard from [place] regarding the results in 2 weeks, please contact this office.

## 2018-04-25 NOTE — Progress Notes (Signed)
Connie Cobb  MRN: [id number] DOB: 04/18/1991  Subjective:  Connie Cobb is a 27 y.o. female seen in office today for a chief complaint of headache. Has PMH of migraines but has been out of meds. Has been on topamax for years. Takes 25mg  BID. Has been out for a week. Has migraines daily, always has nausea with migraine. Has not followed up with neurology in quite sometime. Tolerates topamax well. Has not had a headache requiring ED visit in a very long time.  Associated Symptoms Nausea/vomiting: yes, nausea, no vomting.   Photophobia/phonophobia: yes  Tearing of eyes: no  Sinus pain/pressure: no  Family hx migraine: yes, mother  Personal stressors: no  Relation to menstrual cycle: no   Red Flags Fever: no  Neck pain/stiffness: no  Vision/speech/swallow/hearing difficulty: no  Focal weakness/numbness: no  Altered mental status: no  Trauma: no  New type of headache: no  Anticoagulant use: no  H/o cancer/HIV/Pregnancy: no   Needs refill for transderm patch, going on a cruise on November 5th-11th.   Review of Systems  Per HPI  Patient Active Problem List   Diagnosis Date Noted  . Ringing in right ear 06/15/2014  . Inattention 01/23/2013  . Migraine, unspecified, without mention of intractable migraine without mention of status migrainosus 01/23/2013  . Chlamydia 12/22/2011  . GERD (gastroesophageal reflux disease) 10/29/2011  . Sinusitis 08/21/2011  . Allergic rhinitis, seasonal 08/21/2011  . UTI (lower urinary tract infection) 08/21/2011  . Screening for malignant neoplasm of the cervix 05/22/2011  . Routine gynecological examination 05/22/2011  . Family history of breast cancer 05/22/2011  . Contact with or exposure to other viral diseases(V01.79) 12/01/2010  . Anxiety 12/01/2010  . RINGWORM 08/01/2010  . VAGINITIS 08/01/2010  . ANEMIA-NOS 05/19/2010    Current Outpatient Medications on File Prior to Visit  Medication Sig Dispense Refill  .  amphetamine-dextroamphetamine (ADDERALL) 5 MG tablet Take 1 tablet (5 mg total) by mouth daily. 30 tablet 0  . aspirin-acetaminophen-caffeine (EXCEDRIN MIGRAINE) 250-250-65 MG tablet Take 1 tablet by mouth every 6 (six) hours as needed for headache.    . cetirizine (ZYRTEC) 10 MG tablet Take 1 tablet (10 mg total) by mouth daily. 30 tablet 0  . diphenhydrAMINE (BENADRYL) 25 mg capsule Take 25 mg by mouth every 6 (six) hours as needed.    Marland Kitchen ibuprofen (ADVIL,MOTRIN) 200 MG tablet Take 800 mg by mouth every 6 (six) hours as needed for headache.    . ondansetron (ZOFRAN) 4 MG tablet Take 1 tablet (4 mg total) by mouth every 8 (eight) hours as needed for nausea or vomiting. 20 tablet 0  . scopolamine (TRANSDERM-SCOP, 1.5 MG,) 1 MG/3DAYS Place 1 patch (1.5 mg total) onto the skin every 3 (three) days. 4 patch 0  . topiramate (TOPAMAX) 25 MG tablet Take 1 tablet (25 mg total) by mouth 2 (two) times daily. 60 tablet 3   No current facility-administered medications on file prior to visit.     Allergies  Allergen Reactions  . Banana Anaphylaxis  . Sulfonamide Derivatives Hives and Shortness Of Breath     Objective:  BP 107/70   Pulse 69   Temp 97.9 F (36.6 C) (Oral)   Resp 18   Ht 5' 8.11" (1.73 m)   Wt 138 lb 9.6 oz (62.9 kg)   LMP 04/22/2018 (Exact Date)   SpO2 100%   BMI 21.01 kg/m   Physical Exam  Constitutional: She is oriented to person, place, and time. She appears well-developed and  well-nourished. She does not appear ill. No distress.  HENT:  Head: Normocephalic and atraumatic.  Mouth/Throat: Uvula is midline, oropharynx is clear and moist and mucous membranes are normal. No tonsillar exudate.  No pain with palpation of bilateral temporal regions.   Eyes: Pupils are equal, round, and reactive to light. Conjunctivae and EOM are normal.  Neck: Normal range of motion and full passive range of motion without pain. No Brudzinski's sign and no Kernig's sign noted.  Cardiovascular:  Normal rate, regular rhythm and normal heart sounds.  Pulmonary/Chest: Effort normal.  Neurological: She is alert and oriented to person, place, and time. She has normal strength and normal reflexes. No cranial nerve deficit or sensory deficit. She displays a negative Romberg sign. Gait normal.  Normal FNF and HTS test.  Normal Tandem walk.   Skin: Skin is warm and dry.  Psychiatric: She has a normal mood and affect.  Vitals reviewed.   Assessment and Plan :  1. Migraine without aura and without status migrainosus, not intractable Current headache consistent with pt's typical migraine. Suggest restarting topamax. Given advise on how to slowly taper up. Zofran prn for nausea. She is overall well appearing, NAD. Patient denied that this was the worst headache of her life.  Patient does not have risk factors for CVA or ICH.  Headache today is non concerning for [facility], ICH, Meningitis, or temporal arteritis. Pt is afebrile with no focal neuro deficits, nuchal rigidity, or change in vision. Given strict return/ED precautions. Pt voice understanding. Follow up with PCP as planned.  - topiramate (TOPAMAX) 25 MG tablet; Take 1 tablet (25 mg total) by mouth 2 (two) times daily.  Dispense: 180 tablet; Refill: 1 - ondansetron (ZOFRAN) 4 MG tablet; Take 1 tablet (4 mg total) by mouth every 8 (eight) hours as needed for nausea or vomiting.  Dispense: 20 tablet; Refill: 0  2. H/O motion sickness Educated on use of transderm-scop for cruise.  - scopolamine (TRANSDERM-SCOP, 1.5 MG,) 1 MG/3DAYS; Place 1 patch (1.5 mg total) onto the skin every 3 (three) days.  Dispense: 4 patch; Refill: 0  Side effects, risks, benefits, and alternatives of the medications and treatment plan prescribed today were discussed, and patient expressed understanding of the instructions given. No barriers to understanding were identified. Red flags discussed in detail. Pt expressed understanding regarding what to do in case of  emergency/urgent symptoms.  Benjiman Core PA-C  Primary Care at [facility] 04/25/2018 3:10 PM

## 2018-07-09 ENCOUNTER — Ambulatory Visit (INDEPENDENT_AMBULATORY_CARE_PROVIDER_SITE_OTHER): Payer: Self-pay | Admitting: Nurse Practitioner

## 2018-07-09 VITALS — BP 110/70 | HR 78 | Temp 98.3°F | Resp 16 | Wt 135.4 lb

## 2018-07-09 DIAGNOSIS — J329 Chronic sinusitis, unspecified: Secondary | ICD-10-CM

## 2018-07-09 DIAGNOSIS — B9789 Other viral agents as the cause of diseases classified elsewhere: Secondary | ICD-10-CM

## 2018-07-09 DIAGNOSIS — R6889 Other general symptoms and signs: Secondary | ICD-10-CM

## 2018-07-09 LAB — POCT INFLUENZA A/B
INFLUENZA A, POC: NEGATIVE
INFLUENZA B, POC: NEGATIVE

## 2018-07-09 MED ORDER — FLUTICASONE PROPIONATE 50 MCG/ACT NA SUSP: 2.0000 | Freq: Every day | NASAL | 0 refills | Status: DC

## 2018-07-09 MED ORDER — CETIRIZINE HCL 10 MG PO TABS: 10.0000 mg | ORAL_TABLET | Freq: Every day | ORAL | 0 refills | Status: DC

## 2018-07-09 MED ORDER — PSEUDOEPH-BROMPHEN-DM 30-2-10 MG/5ML PO SYRP: 5.0000 mL | ORAL_SOLUTION | Freq: Four times a day (QID) | ORAL | 0 refills | Status: AC | PRN

## 2018-07-09 MED FILL — FLUTICASONE PROP 50 MCG SPR: 50 | 30 days supply | Qty: 16 | Fill #0

## 2018-07-09 MED FILL — BROMPHENIR-PSEUDOEPHED-DM S: 30-2-10 | 7 days supply | Qty: 150 | Fill #0

## 2018-07-09 NOTE — Progress Notes (Signed)
Subjective:  Connie Cobb is a 28 y.o. female who presents for evaluation of possible sinusitis.  Symptoms include nasal congestion, sore throat and runny nose and headache.  Onset of symptoms was 1 day ago, and has been unchanged since that time.  Treatment to date:  none.  High risk factors for influenza complications:  none.  Patient's son was just diagnosed with influenza A.  Patient presents to ensure she does not have the flu as well. Patient denies any history of seasonal allergies.  Patient has an influenza vaccine this flu season.  The following portions of the patient's history were reviewed and updated as appropriate:  allergies, current medications and past medical history.  Constitutional: positive for fatigue, negative for anorexia, chills, fevers and malaise Eyes: negative Ears, nose, mouth, throat, and face: positive for nasal congestion and sore throat, negative for ear drainage, earaches and hoarseness Respiratory: negative Cardiovascular: negative Gastrointestinal: negative Neurological: positive for headaches, negative for coordination problems, dizziness, paresthesia, vertigo and weakness Objective:  BP 110/70 (BP Location: Right Arm, Patient Position: Sitting, Cuff Size: Normal)   Pulse 78   Temp 98.3 F (36.8 C) (Oral)   Resp 16   Wt 135 lb 6.4 oz (61.4 kg)   SpO2 99%   BMI 20.52 kg/m  General appearance: alert, cooperative, fatigued and no distress Head: Normocephalic, without obvious abnormality, atraumatic Eyes: conjunctivae/corneas clear. PERRL, EOM's intact. Fundi benign. Ears: normal TM and external ear canal left ear and abnormal TM right ear - mucoid middle ear fluid Nose: no discharge, mild congestion, sinus tenderness mild maxillary sinus tenderness bilaterally, mild frontal sinus tenderness bilaterally, turbinates swollen and inflammed Throat: abnormal findings: mild oropharyngeal erythema Lungs: clear to auscultation bilaterally Heart: regular rate and  rhythm, S1, S2 normal, no murmur, click, rub or gallop Abdomen: soft, non-tender; bowel sounds normal; no masses,  no organomegaly Pulses: 2+ and symmetric Skin: Skin color, texture, turgor normal. No rashes or lesions Lymph nodes: cervical and submandibular nodes normal Neurologic: Grossly normal, no cranial nerve deficits    Assessment:  Viral Sinusitis    Plan:   Exam findings, diagnosis etiology and medication use and indications reviewed with patient. Follow- Up and discharge instructions provided. No emergent/urgent issues found on exam.  Based on the patient's clinical presentation, symptoms, and physical exam, patient symptoms are congruent with a viral sinusitis.  Patient's influenza test was negative.  Will provide symptomatic treatment for the patient to include Flonase, Zyrtec, and will add Bromfed cough medicine to help with the upper respiratory symptoms.  Patient education was provided. Patient verbalized understanding of information provided and agrees with plan of care (POC), all questions answered. The patient is advised to call or return to clinic if condition does not see an improvement in symptoms, or to seek the care of the closest emergency department if condition worsens with the above plan.   1. Flu-like symptoms  - POCT Influenza A/B  2. Viral sinusitis  - brompheniramine-pseudoephedrine-DM 30-2-10 MG/5ML syrup; Take 5 mLs by mouth 4 (four) times daily as needed for up to 7 days.  Dispense: 150 mL; Refill: 0 - cetirizine (ZYRTEC) 10 MG tablet; Take 1 tablet (10 mg total) by mouth daily.  Dispense: 30 tablet; Refill: 0 - fluticasone (FLONASE) 50 MCG/ACT nasal spray; Place 2 sprays into both nostrils daily for 10 days.  Dispense: 16 g; Refill: 0 -Take medication as prescribed. -Ibuprofen or Tylenol for pain, fever, or general discomfort. -Increase fluids. -Use a humidifier or vaporizer when at home  and during sleep to help with nasal congestion. -May use a  teaspoon of honey or over-the-counter cough drops to help with sore throat. -May use normal saline nasal spray to help with nasal congestion throughout the day. -Follow-up if symptoms do not improve.

## 2018-07-09 NOTE — Patient Instructions (Signed)
Sinusitis, Adult -Take medication as prescribed. -Ibuprofen or Tylenol for pain, fever, or general discomfort. -Increase fluids. -Use a humidifier or vaporizer when at home and during sleep to help with nasal congestion. -May use a teaspoon of honey or over-the-counter cough drops to help with sore throat. -May use normal saline nasal spray to help with nasal congestion throughout the day. -Follow-up if symptoms do not improve.   Sinusitis is inflammation of your sinuses. Sinuses are hollow spaces in the bones around your face. Your sinuses are located:  Around your eyes.  In the middle of your forehead.  Behind your nose.  In your cheekbones. Mucus normally drains out of your sinuses. When your nasal tissues become inflamed or swollen, mucus can become trapped or blocked. This allows bacteria, viruses, and fungi to grow, which leads to infection. Most infections of the sinuses are caused by a virus. Sinusitis can develop quickly. It can last for up to 4 weeks (acute) or for more than 12 weeks (chronic). Sinusitis often develops after a cold. What are the causes? This condition is caused by anything that creates swelling in the sinuses or stops mucus from draining. This includes:  Allergies.  Asthma.  Infection from bacteria or viruses.  Deformities or blockages in your nose or sinuses.  Abnormal growths in the nose (nasal polyps).  Pollutants, such as chemicals or irritants in the air.  Infection from fungi (rare). What increases the risk? You are more likely to develop this condition if you:  Have a weak body defense system (immune system).  Do a lot of swimming or diving.  Overuse nasal sprays.  Smoke. What are the signs or symptoms? The main symptoms of this condition are pain and a feeling of pressure around the affected sinuses. Other symptoms include:  Stuffy nose or congestion.  Thick drainage from your nose.  Swelling and warmth over the affected  sinuses.  Headache.  Upper toothache.  A cough that may get worse at night.  Extra mucus that collects in the throat or the back of the nose (postnasal drip).  Decreased sense of smell and taste.  Fatigue.  A fever.  Sore throat.  Bad breath. How is this diagnosed? This condition is diagnosed based on:  Your symptoms.  Your medical history.  A physical exam.  Tests to find out if your condition is acute or chronic. This may include: ? Checking your nose for nasal polyps. ? Viewing your sinuses using a device that has a light (endoscope). ? Testing for allergies or bacteria. ? Imaging tests, such as an MRI or CT scan. In rare cases, a bone biopsy may be done to rule out more serious types of fungal sinus disease. How is this treated? Treatment for sinusitis depends on the cause and whether your condition is chronic or acute.  If caused by a virus, your symptoms should go away on their own within 10 days. You may be given medicines to relieve symptoms. They include: ? Medicines that shrink swollen nasal passages (topical intranasal decongestants). ? Medicines that treat allergies (antihistamines). ? A spray that eases inflammation of the nostrils (topical intranasal corticosteroids). ? Rinses that help get rid of thick mucus in your nose (nasal saline washes).  If caused by bacteria, your health care provider may recommend waiting to see if your symptoms improve. Most bacterial infections will get better without antibiotic medicine. You may be given antibiotics if you have: ? A severe infection. ? A weak immune system.  If caused  by narrow nasal passages or nasal polyps, you may need to have surgery. Follow these instructions at home: Medicines  Take, use, or apply over-the-counter and prescription medicines only as told by your health care provider. These may include nasal sprays.  If you were prescribed an antibiotic medicine, take it as told by your health care  provider. Do not stop taking the antibiotic even if you start to feel better. Hydrate and humidify   Drink enough fluid to keep your urine pale yellow. Staying hydrated will help to thin your mucus.  Use a cool mist humidifier to keep the humidity level in your home above 50%.  Inhale steam for 10-15 minutes, 3-4 times a day, or as told by your health care provider. You can do this in the bathroom while a hot shower is running.  Limit your exposure to cool or dry air. Rest  Rest as much as possible.  Sleep with your head raised (elevated).  Make sure you get enough sleep each night. General instructions   Apply a warm, moist washcloth to your face 3-4 times a day or as told by your health care provider. This will help with discomfort.  Wash your hands often with soap and water to reduce your exposure to germs. If soap and water are not available, use hand sanitizer.  Do not smoke. Avoid being around people who are smoking (secondhand smoke).  Keep all follow-up visits as told by your health care provider. This is important. Contact a health care provider if:  You have a fever.  Your symptoms get worse.  Your symptoms do not improve within 10 days. Get help right away if:  You have a severe headache.  You have persistent vomiting.  You have severe pain or swelling around your face or eyes.  You have vision problems.  You develop confusion.  Your neck is stiff.  You have trouble breathing. Summary  Sinusitis is soreness and inflammation of your sinuses. Sinuses are hollow spaces in the bones around your face.  This condition is caused by nasal tissues that become inflamed or swollen. The swelling traps or blocks the flow of mucus. This allows bacteria, viruses, and fungi to grow, which leads to infection.  If you were prescribed an antibiotic medicine, take it as told by your health care provider. Do not stop taking the antibiotic even if you start to feel  better.  Keep all follow-up visits as told by your health care provider. This is important. This information is not intended to replace advice given to you by your health care provider. Make sure you discuss any questions you have with your health care provider. Document Released: 06/19/2005 Document Revised: 11/19/2017 Document Reviewed: 11/19/2017 Elsevier Interactive Patient Education  2019 [employer].

## 2018-07-30 ENCOUNTER — Ambulatory Visit (INDEPENDENT_AMBULATORY_CARE_PROVIDER_SITE_OTHER): Payer: 59 | Admitting: Obstetrics

## 2018-07-30 ENCOUNTER — Encounter: Payer: Self-pay | Admitting: Obstetrics

## 2018-07-30 VITALS — BP 123/82 | HR 121 | Wt 140.0 lb

## 2018-07-30 DIAGNOSIS — Z113 Encounter for screening for infections with a predominantly sexual mode of transmission: Secondary | ICD-10-CM

## 2018-07-30 DIAGNOSIS — N760 Acute vaginitis: Secondary | ICD-10-CM | POA: Diagnosis not present

## 2018-07-30 DIAGNOSIS — B9689 Other specified bacterial agents as the cause of diseases classified elsewhere: Secondary | ICD-10-CM | POA: Diagnosis not present

## 2018-07-30 DIAGNOSIS — N898 Other specified noninflammatory disorders of vagina: Secondary | ICD-10-CM

## 2018-07-30 DIAGNOSIS — R102 Pelvic and perineal pain: Secondary | ICD-10-CM | POA: Diagnosis not present

## 2018-07-30 LAB — POCT URINALYSIS DIPSTICK
BILIRUBIN UA: NEGATIVE
Glucose, UA: NEGATIVE
Ketones, UA: NEGATIVE
Leukocytes, UA: NEGATIVE
Nitrite, UA: NEGATIVE
Protein, UA: NEGATIVE
RBC UA: NEGATIVE
Spec Grav, UA: 1.01 (ref 1.010–1.025)
Urobilinogen, UA: 0.2 E.U./dL
pH, UA: 7.5 (ref 5.0–8.0)

## 2018-07-30 MED ORDER — TINIDAZOLE 500 MG PO TABS: 1000.0000 mg | ORAL_TABLET | Freq: Every day | ORAL | 2 refills | Status: DC

## 2018-07-30 MED FILL — TINIDAZOLE 500 MG TABS: 500 | 5 days supply | Qty: 10 | Fill #0

## 2018-07-30 NOTE — Progress Notes (Signed)
[id number]

## 2018-07-30 NOTE — Progress Notes (Signed)
Subjective:    Connie Cobb  who presents for IUD string check. The patient has had pelvic pain for the past month and can't feel her IUD string.  She also complains of malodorous ( fishy ) vaginal discharge.The patient is sexually active. Pertinent past medical history: none.  Likes her ParaGuard IUD.   The information documented in the HPI was reviewed and verified.  Menstrual History: OB History    Gravida Para Term Preterm AB Living   1 1 1     1    SAB TAB Ectopic Multiple Live Births         0 1      No LMP recorded. has ParaGuard IUD   Patient Active Problem List   Diagnosis Date Noted   Patient Active Problem List   Diagnosis Date Noted  . Ringing in right ear 06/15/2014  . Inattention 01/23/2013  . Migraine, unspecified, without mention of intractable migraine without mention of status migrainosus 01/23/2013  . Chlamydia 12/22/2011  . GERD (gastroesophageal reflux disease) 10/29/2011  . Sinusitis 08/21/2011  . Allergic rhinitis, seasonal 08/21/2011  . UTI (lower urinary tract infection) 08/21/2011  . Screening for malignant neoplasm of the cervix 05/22/2011  . Routine gynecological examination 05/22/2011  . Family history of breast cancer 05/22/2011  . Contact with or exposure to other viral diseases(V01.79) 12/01/2010  . Anxiety 12/01/2010  . RINGWORM 08/01/2010  . VAGINITIS 08/01/2010  . ANEMIA-NOS 05/19/2010    No past medical history on file.  Past Surgical History:  Procedure Laterality Date   Past Surgical History:  Procedure Laterality Date  . TONSILLECTOMY        Current Outpatient Prescriptions:  No medication comments found. Current Outpatient Medications on File Prior to Visit  Medication Sig Dispense Refill  . amphetamine-dextroamphetamine (ADDERALL) 5 MG tablet Take 1 tablet (5 mg total) by mouth daily. 30 tablet 0  . aspirin-acetaminophen-caffeine (EXCEDRIN MIGRAINE) 250-250-65 MG tablet Take 1 tablet by mouth every 6 (six) hours as needed  for headache.    . cetirizine (ZYRTEC) 10 MG tablet Take 1 tablet (10 mg total) by mouth daily. 30 tablet 0  . cetirizine (ZYRTEC) 10 MG tablet Take 1 tablet (10 mg total) by mouth daily. 30 tablet 0  . diphenhydrAMINE (BENADRYL) 25 mg capsule Take 25 mg by mouth every 6 (six) hours as needed.    . fluticasone (FLONASE) 50 MCG/ACT nasal spray Place 2 sprays into both nostrils daily for 10 days. 16 g 0  . ibuprofen (ADVIL,MOTRIN) 200 MG tablet Take 800 mg by mouth every 6 (six) hours as needed for headache.    . ondansetron (ZOFRAN) 4 MG tablet Take 1 tablet (4 mg total) by mouth every 8 (eight) hours as needed for nausea or vomiting. (Patient not taking: Reported on 07/09/2018) 20 tablet 0  . scopolamine (TRANSDERM-SCOP, 1.5 MG,) 1 MG/3DAYS Place 1 patch (1.5 mg total) onto the skin every 3 (three) days. (Patient not taking: Reported on 07/09/2018) 4 patch 0  . topiramate (TOPAMAX) 25 MG tablet Take 1 tablet (25 mg total) by mouth 2 (two) times daily. 180 tablet 1   No current facility-administered medications on file prior to visit.     Allergies  Allergen Reactions   Allergies  Allergen Reactions  . Banana Anaphylaxis  . Sulfonamide Derivatives Hives and Shortness Of Breath     Social History  Substance Use Topics   Social History   Socioeconomic History  . Marital status: Single    Spouse name:  Not on file  . Number of children: Not on file  . Years of education: Not on file  . Highest education level: Not on file  Occupational History  . Not on file  Social Needs  . Financial resource strain: Not on file  . Food insecurity:    Worry: Not on file    Inability: Not on file  . Transportation needs:    Medical: Not on file    Non-medical: Not on file  Tobacco Use  . Smoking status: Never Smoker  . Smokeless tobacco: Never Used  Substance and Sexual Activity  . Alcohol use: No    Alcohol/week: 0.0 standard drinks  . Drug use: No  . Sexual activity: Yes    Partners: Male     Birth control/protection: I.U.D.  Lifestyle  . Physical activity:    Days per week: Not on file    Minutes per session: Not on file  . Stress: Not on file  Relationships  . Social connections:    Talks on phone: Not on file    Gets together: Not on file    Attends religious service: Not on file    Active member of club or organization: Not on file    Attends meetings of clubs or organizations: Not on file    Relationship status: Not on file  . Intimate partner violence:    Fear of current or ex partner: Not on file    Emotionally abused: Not on file    Physically abused: Not on file    Forced sexual activity: Not on file  Other Topics Concern  . Not on file  Social History Narrative  . Not on file     No family history on file.     Review of Systems Constitutional: negative for weight loss Genitourinary:negative for abnormal menstrual periods, but positive for malodorous vaginal discharge   Objective:   BP 115/77   Pulse 89   Wt 161 lb (73 kg)   BMI 25.22 kg/m    General:   alert  Skin:   no rash or abnormalities  Lungs:   clear to auscultation bilaterally  Heart:   regular rate and rhythm, S1, S2 normal, no murmur, click, rub or gallop  Breasts:   deferred  Abdomen:  normal findings: no organomegaly, soft, non-tender and no hernia  Pelvis:  External genitalia: normal general appearance Urinary system: urethral meatus normal and bladder without fullness, nontender Vaginal: normal without tenderness, induration or masses Cervix: normal appearance, IUD strings present Adnexa: normal bimanual exam Uterus: anteverted and non-tender, normal size   Lab Review Urine pregnancy test Labs reviewed yes Radiologic studies reviewed no  50% of 15 min visit spent on counseling and coordination of care.    Assessment:    [age] y.o., continuing IUD, no contraindications.   Plan:   1. Pelvic pain Rx: - POCT urinalysis dipstick - US PELVIC COMPLETE WITH  TRANSVAGINAL; Future  2. Vaginal discharge Rx: - Cervicovaginal ancillary only( [place])  3. BV (bacterial vaginosis) Rx: - tinidazole (TINDAMAX) 500 MG tablet; Take 2 tablets (1,000 mg total) by mouth daily with breakfast.  Dispense: 10 tablet; Refill: 2  All questions answered.  No orders of the defined types were placed in this encounter.  No orders of the defined types were placed in this encounter.  Need to obtain previous records Follow up as needed or in ~ 7 MONTHS for annual exam.   Brock BadHARLES A.  MD 07-30-18

## 2018-07-31 LAB — CERVICOVAGINAL ANCILLARY ONLY
Bacterial vaginitis: POSITIVE — AB
Candida vaginitis: NEGATIVE
Chlamydia: NEGATIVE
Neisseria Gonorrhea: NEGATIVE
Trichomonas: NEGATIVE

## 2018-08-01 ENCOUNTER — Other Ambulatory Visit: Payer: Self-pay | Admitting: Obstetrics

## 2018-08-02 ENCOUNTER — Other Ambulatory Visit: Payer: 59

## 2018-08-09 ENCOUNTER — Ambulatory Visit
Admission: RE | Admit: 2018-08-09 | Discharge: 2018-08-09 | Disposition: A | Discharge status: Home / Self Care | Payer: 59 | Source: Ambulatory Visit | Attending: Obstetrics | Admitting: Obstetrics

## 2018-08-09 DIAGNOSIS — R102 Pelvic and perineal pain: Secondary | ICD-10-CM | POA: Diagnosis not present

## 2018-08-27 ENCOUNTER — Telehealth: Payer: Self-pay | Admitting: Family Medicine

## 2018-08-27 NOTE — Telephone Encounter (Signed)
Please advise 

## 2018-08-27 NOTE — Telephone Encounter (Signed)
Adderall refill request  Pt of Dr. Creta Levin.

## 2018-08-27 NOTE — Telephone Encounter (Signed)
Copied from CRM [id number]. Topic: Quick Communication - Rx Refill/Question >> Aug 27, 2018 11:46 AM Richarda Blade wrote: Medication:  amphetamine-dextroamphetamine (ADDERALL) 5 MG tablet  Has the patient contacted their pharmacy? No. (Agent: If no, request that the patient contact the pharmacy for the refill.) Patient does not have any refills  Preferred Pharmacy (with phone number or street name):  [facility] - [place], [place] - [address]. [phone] (Phone) [phone] (Fax)     Agent: Please be advised that RX refills may take up to 3 business days. We ask that you follow-up with your pharmacy.

## 2018-09-02 ENCOUNTER — Other Ambulatory Visit: Payer: Self-pay | Admitting: Family Medicine

## 2018-09-03 NOTE — Telephone Encounter (Signed)
Patient is requesting a refill of the following medications: Requested Prescriptions   Pending Prescriptions Disp Refills  . amphetamine-dextroamphetamine (ADDERALL) 5 MG tablet 30 tablet 0    Sig: Take 1 tablet (5 mg total) by mouth daily.    Date of patient request: 09/02/2018 Last office visit: 04/25/2018 Date of last refill: 03/18/2018 Last refill amount: 30 Follow up time period per chart:

## 2018-09-04 ENCOUNTER — Telehealth: Payer: Self-pay | Admitting: Family Medicine

## 2018-09-04 NOTE — Telephone Encounter (Signed)
Patient is requesting Adderall

## 2018-09-04 NOTE — Telephone Encounter (Signed)
Copied from CRM [id number]. Topic: General - Other >> Sep 04, 2018  9:01 AM Connie Cobb A wrote: Reason for CRM: Patient called to get a refill on her medication say that she have a huge test coming up at school and really need this to help keep her calm. Please advise. Ph# [phone]

## 2018-09-06 MED ORDER — AMPHETAMINE-DEXTROAMPHETAMINE 5 MG PO TABS: 5.0000 mg | ORAL_TABLET | Freq: Every day | ORAL | 0 refills | Status: DC

## 2018-09-06 MED FILL — DEXTROAMP-AMPHETAMINE 5 MG: 5 | 30 days supply | Qty: 30 | Fill #0

## 2018-09-06 NOTE — Telephone Encounter (Signed)
mychart message sent to notify patient.

## 2018-10-08 ENCOUNTER — Other Ambulatory Visit: Payer: Self-pay | Admitting: Family Medicine

## 2018-10-14 ENCOUNTER — Other Ambulatory Visit: Payer: Self-pay | Admitting: Family Medicine

## 2018-10-17 MED ORDER — AMPHETAMINE-DEXTROAMPHETAMINE 5 MG PO TABS: 5.0000 mg | ORAL_TABLET | Freq: Every day | ORAL | 0 refills | Status: DC

## 2018-10-26 ENCOUNTER — Encounter: Payer: Self-pay | Admitting: Family Medicine

## 2018-10-30 ENCOUNTER — Telehealth: Payer: Self-pay | Admitting: Family Medicine

## 2018-10-30 NOTE — Telephone Encounter (Signed)
Copied from CRM [id number]. Topic: General - Other >> Oct 30, 2018 10:48 AM Herby Abraham C wrote: Reason for CRM: pt received her Rx for Adderall however she was advised by [facility] that due to Covid all Rx need to go to [facility] pharmacy instead. Pt would like assistance with getting Rx sent to [facility] instead.   Please assist.

## 2018-10-31 ENCOUNTER — Other Ambulatory Visit: Payer: Self-pay | Admitting: *Deleted

## 2018-11-07 ENCOUNTER — Encounter: Payer: Self-pay | Admitting: *Deleted

## 2018-11-07 MED ORDER — AMPHETAMINE-DEXTROAMPHETAMINE 5 MG PO TABS: 5.0000 mg | ORAL_TABLET | Freq: Every day | ORAL | 0 refills | Status: DC

## 2018-11-07 MED FILL — AMPHETAMINE-DEXTROAMPHETAMI: 5 | 30 days supply | Qty: 30 | Fill #0

## 2018-11-07 NOTE — Addendum Note (Signed)
Addended by: Collie Siad A on: 11/07/2018 10:21 AM   Modules accepted: Orders

## 2018-11-29 ENCOUNTER — Encounter: Payer: 59 | Admitting: Family Medicine

## 2018-12-06 ENCOUNTER — Other Ambulatory Visit (HOSPITAL_COMMUNITY)
Admission: RE | Admit: 2018-12-06 | Discharge: 2018-12-06 | Disposition: A | Discharge status: Home / Self Care | Payer: 59 | Source: Ambulatory Visit | Attending: Family Medicine | Admitting: Family Medicine

## 2018-12-06 ENCOUNTER — Other Ambulatory Visit: Payer: Self-pay

## 2018-12-06 ENCOUNTER — Ambulatory Visit (INDEPENDENT_AMBULATORY_CARE_PROVIDER_SITE_OTHER): Payer: 59 | Admitting: Family Medicine

## 2018-12-06 ENCOUNTER — Encounter: Payer: Self-pay | Admitting: Family Medicine

## 2018-12-06 VITALS — BP 112/70 | HR 99 | Temp 98.2°F | Ht 67.0 in | Wt 137.8 lb

## 2018-12-06 DIAGNOSIS — Z13220 Encounter for screening for lipoid disorders: Secondary | ICD-10-CM

## 2018-12-06 DIAGNOSIS — Z1329 Encounter for screening for other suspected endocrine disorder: Secondary | ICD-10-CM

## 2018-12-06 DIAGNOSIS — Z113 Encounter for screening for infections with a predominantly sexual mode of transmission: Secondary | ICD-10-CM | POA: Diagnosis not present

## 2018-12-06 DIAGNOSIS — Z13228 Encounter for screening for other metabolic disorders: Secondary | ICD-10-CM | POA: Diagnosis not present

## 2018-12-06 DIAGNOSIS — Z0001 Encounter for general adult medical examination with abnormal findings: Secondary | ICD-10-CM | POA: Diagnosis not present

## 2018-12-06 DIAGNOSIS — Z1389 Encounter for screening for other disorder: Secondary | ICD-10-CM | POA: Diagnosis not present

## 2018-12-06 DIAGNOSIS — G43009 Migraine without aura, not intractable, without status migrainosus: Secondary | ICD-10-CM

## 2018-12-06 DIAGNOSIS — F900 Attention-deficit hyperactivity disorder, predominantly inattentive type: Secondary | ICD-10-CM | POA: Diagnosis not present

## 2018-12-06 DIAGNOSIS — Z130 Encounter for screening for diseases of the blood and blood-forming organs and certain disorders involving the immune mechanism: Secondary | ICD-10-CM | POA: Diagnosis not present

## 2018-12-06 DIAGNOSIS — Z0000 Encounter for general adult medical examination without abnormal findings: Secondary | ICD-10-CM

## 2018-12-06 LAB — POCT URINALYSIS DIP (MANUAL ENTRY)
Blood, UA: NEGATIVE
Glucose, UA: NEGATIVE mg/dL
Leukocytes, UA: NEGATIVE
Nitrite, UA: NEGATIVE
Protein Ur, POC: 30 mg/dL — AB
Spec Grav, UA: 1.025 (ref 1.010–1.025)
Urobilinogen, UA: 0.2 E.U./dL
pH, UA: 6 (ref 5.0–8.0)

## 2018-12-06 MED ORDER — AMPHETAMINE-DEXTROAMPHETAMINE 5 MG PO TABS: 5.0000 mg | ORAL_TABLET | Freq: Every day | ORAL | 0 refills | Status: DC

## 2018-12-06 MED ORDER — TOPIRAMATE 25 MG PO TABS: 25.0000 mg | ORAL_TABLET | Freq: Two times a day (BID) | ORAL | 1 refills | Status: DC

## 2018-12-06 MED ORDER — ONDANSETRON HCL 4 MG PO TABS: 4.0000 mg | ORAL_TABLET | Freq: Three times a day (TID) | ORAL | 0 refills | Status: DC | PRN

## 2018-12-06 MED FILL — ONDANSETRON HCL 4 MG TABLET: 4 | 7 days supply | Qty: 20 | Fill #0

## 2018-12-06 MED FILL — TOPIRAMATE 25 MG TAB: 25 | 90 days supply | Qty: 180 | Fill #0

## 2018-12-06 MED FILL — DEXTROAMP-AMPHETAMINE 5 MG: 5 | 30 days supply | Qty: 30 | Fill #0

## 2018-12-06 NOTE — Patient Instructions (Signed)
° ° ° °  If you have lab work done today you will be contacted with your lab results within the next 2 weeks.  If you have not heard from us then please contact us. The fastest way to get your results is to register for My Chart. ° ° °IF you received an x-ray today, you will receive an invoice from [facility]. Please contact  Radiology at [phone] with questions or concerns regarding your invoice.  ° °IF you received labwork today, you will receive an invoice from [facility]. Please contact [facility] at [phone] with questions or concerns regarding your invoice.  ° °Our billing staff will not be able to assist you with questions regarding bills from these companies. ° °You will be contacted with the lab results as soon as they are available. The fastest way to get your results is to activate your My Chart account. Instructions are located on the last page of this paperwork. If you have not heard from us regarding the results in 2 weeks, please contact this office. °  ° ° ° °

## 2018-12-07 LAB — LIPID PANEL
Chol/HDL Ratio: 2.5 ratio (ref 0.0–4.4)
Cholesterol, Total: 132 mg/dL (ref 100–199)
HDL: 53 mg/dL (ref >39)
LDL Calculated: 67 mg/dL (ref 0–99)
Triglycerides: 59 mg/dL (ref 0–149)
VLDL Cholesterol Cal: 12 mg/dL (ref 5–40)

## 2018-12-07 LAB — CMP14+EGFR
ALT: 15 IU/L (ref 0–32)
AST: 19 IU/L (ref 0–40)
Albumin/Globulin Ratio: 2.6 — ABNORMAL HIGH (ref 1.2–2.2)
Albumin: 5 g/dL (ref 3.9–5.0)
Alkaline Phosphatase: 32 IU/L — ABNORMAL LOW (ref 39–117)
BUN/Creatinine Ratio: 12 (ref 9–23)
BUN: 11 mg/dL (ref 6–20)
Bilirubin Total: 0.4 mg/dL (ref 0.0–1.2)
CO2: 19 mmol/L — ABNORMAL LOW (ref 20–29)
Calcium: 9.5 mg/dL (ref 8.7–10.2)
Chloride: 105 mmol/L (ref 96–106)
Creatinine, Ser: 0.94 mg/dL (ref 0.57–1.00)
GFR calc Af Amer: 95 mL/min/{1.73_m2} (ref >59)
GFR calc non Af Amer: 83 mL/min/{1.73_m2} (ref >59)
Globulin, Total: 1.9 g/dL (ref 1.5–4.5)
Glucose: 93 mg/dL (ref 65–99)
Potassium: 4.2 mmol/L (ref 3.5–5.2)
Sodium: 139 mmol/L (ref 134–144)
Total Protein: 6.9 g/dL (ref 6.0–8.5)

## 2018-12-07 LAB — CBC
Hematocrit: 37.3 % (ref 34.0–46.6)
Hemoglobin: 12.7 g/dL (ref 11.1–15.9)
MCH: 28 pg (ref 26.6–33.0)
MCHC: 34 g/dL (ref 31.5–35.7)
MCV: 82 fL (ref 79–97)
Platelets: 238 10*3/uL (ref 150–450)
RBC: 4.53 x10E6/uL (ref 3.77–5.28)
RDW: 12.3 % (ref 11.7–15.4)
WBC: 4.3 10*3/uL (ref 3.4–10.8)

## 2018-12-07 LAB — TSH: TSH: 0.739 u[IU]/mL (ref 0.450–4.500)

## 2018-12-07 LAB — RPR: RPR Ser Ql: NONREACTIVE

## 2018-12-07 LAB — HEPATITIS B SURFACE ANTIGEN: Hepatitis B Surface Ag: NEGATIVE

## 2018-12-07 LAB — HIV ANTIBODY (ROUTINE TESTING W REFLEX): HIV Screen 4th Generation wRfx: NONREACTIVE

## 2018-12-09 LAB — GC/CHLAMYDIA PROBE AMP (~~LOC~~) NOT AT ARMC
Chlamydia: NEGATIVE
Neisseria Gonorrhea: NEGATIVE

## 2018-12-09 NOTE — Progress Notes (Signed)
Chief Complaint  Patient presents with  . Annual Exam    Subjective:  Connie Cobb is a 28 y.o. female here for a health maintenance visit.  Patient is established pt  Patient Active Problem List   Diagnosis Date Noted  . Ringing in right ear 06/15/2014  . Inattention 01/23/2013  . Migraine, unspecified, without mention of intractable migraine without mention of status migrainosus 01/23/2013  . Chlamydia 12/22/2011  . GERD (gastroesophageal reflux disease) 10/29/2011  . Sinusitis 08/21/2011  . Allergic rhinitis, seasonal 08/21/2011  . UTI (lower urinary tract infection) 08/21/2011  . Screening for malignant neoplasm of the cervix 05/22/2011  . Routine gynecological examination 05/22/2011  . Family history of breast cancer 05/22/2011  . Contact with or exposure to other viral diseases(V01.79) 12/01/2010  . Anxiety 12/01/2010  . RINGWORM 08/01/2010  . VAGINITIS 08/01/2010  . ANEMIA-NOS 05/19/2010    Past Medical History:  Diagnosis Date  . Allergy   . Anemia   . GERD (gastroesophageal reflux disease)   . Migraine     Past Surgical History:  Procedure Laterality Date  . TONSILLECTOMY       Outpatient Medications Prior to Visit  Medication Sig Dispense Refill  . diphenhydrAMINE (BENADRYL) 25 mg capsule Take 25 mg by mouth every 6 (six) hours as needed.    Marland Kitchen ibuprofen (ADVIL,MOTRIN) 200 MG tablet Take 800 mg by mouth every 6 (six) hours as needed for headache.    . amphetamine-dextroamphetamine (ADDERALL) 5 MG tablet Take 1 tablet (5 mg total) by mouth daily. 30 tablet 0  . ondansetron (ZOFRAN) 4 MG tablet Take 1 tablet (4 mg total) by mouth every 8 (eight) hours as needed for nausea or vomiting. 20 tablet 0  . topiramate (TOPAMAX) 25 MG tablet Take 1 tablet (25 mg total) by mouth 2 (two) times daily. 180 tablet 1  . cetirizine (ZYRTEC) 10 MG tablet Take 1 tablet (10 mg total) by mouth daily. 30 tablet 0  . fluticasone (FLONASE) 50 MCG/ACT nasal spray Place 2 sprays into  both nostrils daily for 10 days. 16 g 0  . aspirin-acetaminophen-caffeine (EXCEDRIN MIGRAINE) 250-250-65 MG tablet Take 1 tablet by mouth every 6 (six) hours as needed for headache.    . cetirizine (ZYRTEC) 10 MG tablet Take 1 tablet (10 mg total) by mouth daily. (Patient not taking: Reported on 12/06/2018) 30 tablet 0  . scopolamine (TRANSDERM-SCOP, 1.5 MG,) 1 MG/3DAYS Place 1 patch (1.5 mg total) onto the skin every 3 (three) days. (Patient not taking: Reported on 07/09/2018) 4 patch 0  . tinidazole (TINDAMAX) 500 MG tablet Take 2 tablets (1,000 mg total) by mouth daily with breakfast. (Patient not taking: Reported on 12/06/2018) 10 tablet 2   No facility-administered medications prior to visit.     Allergies  Allergen Reactions  . Banana Anaphylaxis  . Sulfonamide Derivatives Hives and Shortness Of Breath     Family History  Problem Relation Age of Onset  . Diabetes Father   . Hyperlipidemia Father   . Hypertension Father   . Cancer Maternal Grandmother   . Hypertension Maternal Grandmother   . Diabetes Paternal Grandmother   . Heart disease Paternal Grandmother   . Cancer Paternal Grandfather   . Diabetes Other        grandparent  . Breast cancer Other        grandmother     Health Habits: Dental Exam: up to date Eye Exam: up to date Exercise: 0 times/week on average Current exercise activities: walking/running  Diet: balanced  Social History   Socioeconomic History  . Marital status: Single    Spouse name: Not on file  . Number of children: 1  . Years of education: Not on file  . Highest education level: Not on file  Occupational History  . Not on file  Social Needs  . Financial resource strain: Not on file  . Food insecurity:    Worry: Not on file    Inability: Not on file  . Transportation needs:    Medical: Not on file    Non-medical: Not on file  Tobacco Use  . Smoking status: Never Smoker  . Smokeless tobacco: Never Used  Substance and Sexual Activity   . Alcohol use: No    Alcohol/week: 0.0 standard drinks  . Drug use: No  . Sexual activity: Yes    Partners: Male    Birth control/protection: I.U.D.  Lifestyle  . Physical activity:    Days per week: Not on file    Minutes per session: Not on file  . Stress: Not on file  Relationships  . Social connections:    Talks on phone: Not on file    Gets together: Not on file    Attends religious service: Not on file    Active member of club or organization: Not on file    Attends meetings of clubs or organizations: Not on file    Relationship status: Not on file  . Intimate partner violence:    Fear of current or ex partner: Not on file    Emotionally abused: Not on file    Physically abused: Not on file    Forced sexual activity: Not on file  Other Topics Concern  . Not on file  Social History Narrative  . Not on file   Social History   Substance and Sexual Activity  Alcohol Use No  . Alcohol/week: 0.0 standard drinks   Social History   Tobacco Use  Smoking Status Never Smoker  Smokeless Tobacco Never Used   Social History   Substance and Sexual Activity  Drug Use No    GYN: Sexual Health Menstrual status: regular menses LMP: No LMP recorded. (Menstrual status: IUD). Last pap smear: see HM section History of abnormal pap smears:  Sexually active:  with female partner Current contraception: IUD  Health Maintenance: See under health Maintenance activity for review of completion dates as well. Immunization History  Administered Date(s) Administered  . Influenza Split 03/27/2011  . Tdap 01/28/2016      Depression Screen-PHQ2/9 Depression screen [facility] 2/9 12/06/2018 12/08/2014 06/15/2014  Decreased Interest 0 0 0  Down, Depressed, Hopeless 0 0 0  PHQ - 2 Score 0 0 0  Some encounter information is confidential and restricted. Go to Review Flowsheets activity to see all data.       Depression Severity and Treatment Recommendations:  0-4= None  5-9= Mild /  Treatment: Support, educate to call if worse; return in one month  10-14= Moderate / Treatment: Support, watchful waiting; Antidepressant or Psycotherapy  15-19= Moderately severe / Treatment: Antidepressant OR Psychotherapy  >= 20 = Major depression, severe / Antidepressant AND Psychotherapy    Review of Systems   ROS  See HPI for ROS as well.   Review of Systems  Constitutional: Negative for activity change, appetite change, chills and fever.  HENT: Negative for congestion, nosebleeds, trouble swallowing and voice change.   Respiratory: Negative for cough, shortness of breath and wheezing.   Gastrointestinal: Negative for diarrhea,  nausea and vomiting.  Genitourinary: Negative for difficulty urinating, dysuria, flank pain and hematuria.  Musculoskeletal: Negative for back pain, joint swelling and neck pain.  Neurological: Negative for dizziness, speech difficulty, light-headedness and numbness.  See HPI. All other review of systems negative.   Objective:   Vitals:   12/06/18 0827  BP: 112/70  Pulse: 99  Temp: 98.2 F (36.8 C)  TempSrc: Oral  SpO2: 99%  Weight: 137 lb 12.8 oz (62.5 kg)  Height: _0  (1.702 m)    Body mass index is 21.58 kg/m.  Physical Exam Constitutional:      Appearance: Normal appearance. She is normal weight.  HENT:     Head: Normocephalic and atraumatic.     Right Ear: Tympanic membrane normal.     Left Ear: Tympanic membrane normal.  Eyes:     Extraocular Movements: Extraocular movements intact.     Conjunctiva/sclera: Conjunctivae normal.  Neck:     Musculoskeletal: Normal range of motion and neck supple.  Cardiovascular:     Rate and Rhythm: Normal rate and regular rhythm.     Pulses: Normal pulses.     Heart sounds: No murmur.  Pulmonary:     Effort: Pulmonary effort is normal. No respiratory distress.     Breath sounds: Normal breath sounds. No wheezing.  Abdominal:     General: Abdomen is flat. Bowel sounds are normal. There is  no distension.     Palpations: Abdomen is soft. There is no mass.     Tenderness: There is no abdominal tenderness. There is no guarding.  Musculoskeletal: Normal range of motion.        General: No swelling or tenderness.  Skin:    General: Skin is warm.     Capillary Refill: Capillary refill takes less than 2 seconds.     Findings: No erythema or rash.  Neurological:     General: No focal deficit present.     Mental Status: She is alert and oriented to person, place, and time.  Psychiatric:        Mood and Affect: Mood normal.        Behavior: Behavior normal.        Thought Content: Thought content normal.        Judgment: Judgment normal.        Assessment/Plan:   Patient was seen for a health maintenance exam.  Counseled the patient on health maintenance issues. Reviewed her health mainteance schedule and ordered appropriate tests (see orders.) Counseled on regular exercise and weight management. Recommend regular eye exams and dental cleaning.   The following issues were addressed today for health maintenance:   Sevin was seen today for annual exam.  Diagnoses and all orders for this visit:  Encounter for health maintenance examination in adult- Women's Health Maintenance Plan Advised monthly breast exam and annual mammogram Advised dental exam every six months Discussed stress management Discussed pap smear screening guidelines    Screening for metabolic disorder -     [id number]  Screening for hematuria or proteinuria -     POCT urinalysis dipstick  Screening for deficiency anemia -     CBC  Screening for cholesterol level -     Lipid panel  Screening for thyroid disorder -     TSH  Screen for STD (sexually transmitted disease)- verbal consent given -     GC/Chlamydia probe amp ([place])not at [facility] -     HIV Antibody (routine testing w rflx) -  Hepatitis B surface antigen -     RPR  Attention deficit hyperactivity disorder (ADHD),  predominantly inattentive type- stable on current dose Continued 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  Migraine without aura and without status migrainosus, not intractable- stable, refilled daily meds -     ondansetron (ZOFRAN) 4 MG tablet; Take 1 tablet (4 mg total) by mouth every 8 (eight) hours as needed for nausea or vomiting. -     topiramate (TOPAMAX) 25 MG tablet; Take 1 tablet (25 mg total) by mouth 2 (two) times daily.    No follow-ups on file.    Body mass index is 21.58 kg/m.:  Discussed the patient's BMI with patient. The BMI body mass index is 21.58 kg/m.     No future appointments.  Patient Instructions       If you have lab work done today you will be contacted with your lab results within the next 2 weeks.  If you have not heard from [place] then please contact us. The fastest way to get your results is to register for My Chart.   IF you received an x-ray today, you will receive an invoice from [facility]. Please contact [facility] at [phone] with questions or concerns regarding your invoice.   IF you received labwork today, you will receive an invoice from [facility]. Please contact [facility] at [phone] with questions or concerns regarding your invoice.   Our billing staff will not be able to assist you with questions regarding bills from these companies.  You will be contacted with the lab results as soon as they are available. The fastest way to get your results is to activate your My Chart account. Instructions are located on the last page of this paperwork. If you have not heard from [place] regarding the results in 2 weeks, please contact this office.

## 2018-12-12 ENCOUNTER — Encounter: Payer: Self-pay | Admitting: Family Medicine

## 2018-12-17 ENCOUNTER — Encounter: Payer: 59 | Admitting: Family Medicine

## 2019-01-27 ENCOUNTER — Other Ambulatory Visit: Payer: Self-pay | Admitting: Family Medicine

## 2019-01-27 DIAGNOSIS — F900 Attention-deficit hyperactivity disorder, predominantly inattentive type: Secondary | ICD-10-CM

## 2019-01-29 MED ORDER — AMPHETAMINE-DEXTROAMPHETAMINE 5 MG PO TABS: 5.0000 mg | ORAL_TABLET | Freq: Every day | ORAL | 0 refills | Status: DC

## 2019-01-29 MED FILL — DEXTROAMP-AMPHETAMINE 5 MG: 5 | 30 days supply | Qty: 30 | Fill #0

## 2019-04-25 ENCOUNTER — Other Ambulatory Visit: Payer: Self-pay | Admitting: Family Medicine

## 2019-04-25 DIAGNOSIS — G43009 Migraine without aura, not intractable, without status migrainosus: Secondary | ICD-10-CM

## 2019-04-25 DIAGNOSIS — F900 Attention-deficit hyperactivity disorder, predominantly inattentive type: Secondary | ICD-10-CM

## 2019-04-25 NOTE — Telephone Encounter (Signed)
Patient is requesting a refill of the following medications: Requested Prescriptions   Pending Prescriptions Disp Refills  . ondansetron (ZOFRAN) 4 MG tablet 20 tablet 0    Sig: Take 1 tablet (4 mg total) by mouth every 8 (eight) hours as needed for nausea or vomiting.  Marland Kitchen amphetamine-dextroamphetamine (ADDERALL) 5 MG tablet 30 tablet 0    Sig: Take 1 tablet (5 mg total) by mouth daily.    Date of patient request: 04/25/19 Last office visit: 12/06/18 Date of last refill: 01/29/19 Last refill amount: 30 Follow up time period per chart: none

## 2019-04-29 MED ORDER — AMPHETAMINE-DEXTROAMPHETAMINE 5 MG PO TABS: 5.0000 mg | ORAL_TABLET | Freq: Every day | ORAL | 0 refills | Status: DC

## 2019-04-29 MED ORDER — ONDANSETRON HCL 4 MG PO TABS: 4.0000 mg | ORAL_TABLET | Freq: Three times a day (TID) | ORAL | 0 refills | Status: DC | PRN

## 2019-04-29 MED FILL — ONDANSETRON HCL 4 MG TABLET: 4 | 6 days supply | Qty: 20 | Fill #0

## 2019-04-29 MED FILL — DEXTROAMP-AMPHETAMINE 5 MG: 5 | 30 days supply | Qty: 30 | Fill #0

## 2019-10-07 ENCOUNTER — Telehealth: Payer: Self-pay

## 2019-10-07 ENCOUNTER — Other Ambulatory Visit: Payer: Self-pay | Admitting: Family Medicine

## 2019-10-07 DIAGNOSIS — G43009 Migraine without aura, not intractable, without status migrainosus: Secondary | ICD-10-CM

## 2019-10-07 MED ORDER — ONDANSETRON HCL 4 MG PO TABS: 4.0000 mg | ORAL_TABLET | Freq: Three times a day (TID) | ORAL | 3 refills | Status: DC | PRN

## 2019-10-07 MED FILL — ONDANSETRON HCL 4 MG TABLET: 4 | 7 days supply | Qty: 20 | Fill #0

## 2019-10-07 NOTE — Telephone Encounter (Signed)
Patient is requesting a refill of the following medications: Requested Prescriptions    No prescriptions requested or ordered in this encounter  Zofran 4 mg  Date of patient request: 10/07/2019 Last office visit: 12/06/2018 Date of last refill: 04/29/2019 Last refill amount: 20 tab

## 2019-12-31 DIAGNOSIS — Z1159 Encounter for screening for other viral diseases: Secondary | ICD-10-CM | POA: Diagnosis not present

## 2019-12-31 DIAGNOSIS — N920 Excessive and frequent menstruation with regular cycle: Secondary | ICD-10-CM | POA: Diagnosis not present

## 2019-12-31 DIAGNOSIS — Z7689 Persons encountering health services in other specified circumstances: Secondary | ICD-10-CM | POA: Diagnosis not present

## 2019-12-31 DIAGNOSIS — Z136 Encounter for screening for cardiovascular disorders: Secondary | ICD-10-CM | POA: Diagnosis not present

## 2019-12-31 DIAGNOSIS — Z0000 Encounter for general adult medical examination without abnormal findings: Secondary | ICD-10-CM | POA: Diagnosis not present

## 2020-01-12 MED FILL — FLUCONAZOLE 150 MG TABS: 150 | 1 days supply | Qty: 1 | Fill #0

## 2020-01-12 MED FILL — traMADol HCL 50 MG TABS: 50 | 10 days supply | Qty: 10 | Fill #0

## 2020-01-28 DIAGNOSIS — N898 Other specified noninflammatory disorders of vagina: Secondary | ICD-10-CM | POA: Diagnosis not present

## 2020-01-28 DIAGNOSIS — N760 Acute vaginitis: Secondary | ICD-10-CM | POA: Diagnosis not present

## 2020-04-29 ENCOUNTER — Ambulatory Visit: Payer: 59 | Attending: Internal Medicine

## 2020-04-29 DIAGNOSIS — Z23 Encounter for immunization: Secondary | ICD-10-CM

## 2020-04-29 NOTE — Progress Notes (Signed)
   Covid-19 Vaccination Clinic  Name:  Connie Cobb    MRN: [id number] DOB: Nov 13, 1990  04/29/2020  [name] was observed post Covid-19 immunization for 15 minutes without incident. She was provided with Vaccine Information Sheet and instruction to access the V-Safe system.   [name] was instructed to call 911 with any severe reactions post vaccine: Marland Kitchen Difficulty breathing  . Swelling of face and throat  . A fast heartbeat  . A bad rash all over body  . Dizziness and weakness

## 2020-07-02 DIAGNOSIS — H5213 Myopia, bilateral: Secondary | ICD-10-CM | POA: Diagnosis not present

## 2021-01-10 ENCOUNTER — Encounter: Payer: Self-pay | Admitting: Obstetrics

## 2021-01-10 ENCOUNTER — Ambulatory Visit (INDEPENDENT_AMBULATORY_CARE_PROVIDER_SITE_OTHER): Payer: 59 | Admitting: Obstetrics

## 2021-01-10 ENCOUNTER — Other Ambulatory Visit (HOSPITAL_COMMUNITY)
Admission: RE | Admit: 2021-01-10 | Discharge: 2021-01-10 | Disposition: A | Discharge status: Home / Self Care | Payer: 59 | Source: Ambulatory Visit | Attending: Obstetrics | Admitting: Obstetrics

## 2021-01-10 ENCOUNTER — Other Ambulatory Visit (HOSPITAL_COMMUNITY): Payer: Self-pay

## 2021-01-10 ENCOUNTER — Other Ambulatory Visit: Payer: Self-pay

## 2021-01-10 VITALS — BP 111/74 | HR 76 | Ht 67.0 in | Wt 154.0 lb

## 2021-01-10 DIAGNOSIS — Z3169 Encounter for other general counseling and advice on procreation: Secondary | ICD-10-CM

## 2021-01-10 DIAGNOSIS — Z30431 Encounter for routine checking of intrauterine contraceptive device: Secondary | ICD-10-CM

## 2021-01-10 DIAGNOSIS — N898 Other specified noninflammatory disorders of vagina: Secondary | ICD-10-CM | POA: Insufficient documentation

## 2021-01-10 DIAGNOSIS — Z01419 Encounter for gynecological examination (general) (routine) without abnormal findings: Secondary | ICD-10-CM | POA: Diagnosis not present

## 2021-01-10 DIAGNOSIS — Z113 Encounter for screening for infections with a predominantly sexual mode of transmission: Secondary | ICD-10-CM | POA: Diagnosis not present

## 2021-01-10 MED ORDER — CONCEPT OB 130-92.4-1 MG PO CAPS
1.0000 | ORAL_CAPSULE | Freq: Every day | ORAL | 11 refills | Status: DC
  Filled 2021-01-10: qty 30, 30d supply, fill #0

## 2021-01-10 NOTE — Addendum Note (Signed)
Addended by: Coral Ceo A on: 01/10/2021 02:43 PM   Modules accepted: Orders

## 2021-01-10 NOTE — Progress Notes (Addendum)
Subjective:        Connie Cobb is a 30 y.o. female here for a routine exam.  Current complaints: Vaginal discharge.    Personal health questionnaire:  Is patient Ashkenazi Jewish, have a family history of breast and/or ovarian cancer: no Is there a family history of uterine cancer diagnosed at age < 21, gastrointestinal cancer, urinary tract cancer, family member who is a Personnel officer syndrome-associated carrier: no Is the patient overweight and hypertensive, family history of diabetes, personal history of gestational diabetes, preeclampsia or PCOS: no Is patient over 49, have PCOS,  family history of premature CHD under age 77, diabetes, smoke, have hypertension or peripheral artery disease:  no At any time, has a partner hit, kicked or otherwise hurt or frightened you?: no Over the past 2 weeks, have you felt down, depressed or hopeless?: no Over the past 2 weeks, have you felt little interest or pleasure in doing things?:no   Gynecologic History No LMP recorded. (Menstrual status: IUD). Contraception: IUD ( ParaGuard ) Last Pap: 03-01-2018. Results were: normal Last mammogram: n/a. Results were: n/a  Obstetric History OB History  Gravida Para Term Preterm AB Living  2 1 1   1 1   SAB IAB Ectopic Multiple Live Births        0 1    # Outcome Date GA Lbr Len/2nd Weight Sex Delivery Anes PTL Lv  2 Term 01/27/16 [redacted]w[redacted]d 19:09 / 00:30 8 lb 4.5 oz (3.755 kg) M Vag-Spont None  LIV     Birth Comments: Skin tear on scalp  1 AB 06/02/08            Past Medical History:  Diagnosis Date   Allergy    Anemia    GERD (gastroesophageal reflux disease)    Migraine     Past Surgical History:  Procedure Laterality Date   TONSILLECTOMY       Current Outpatient Medications:    Prenat w/o A Vit-FeFum-FePo-FA (CONCEPT OB) 130-92.4-1 MG CAPS, Take 1 capsule by mouth daily., Disp: 30 capsule, Rfl: 11   amphetamine-dextroamphetamine (ADDERALL) 5 MG tablet, Take 1 tablet (5 mg total) by mouth  daily. (Patient not taking: Reported on 01/10/2021), Disp: 30 tablet, Rfl: 0   cetirizine (ZYRTEC) 10 MG tablet, Take 1 tablet (10 mg total) by mouth daily., Disp: 30 tablet, Rfl: 0   diphenhydrAMINE (BENADRYL) 25 mg capsule, Take 25 mg by mouth every 6 (six) hours as needed. (Patient not taking: Reported on 01/10/2021), Disp: , Rfl:    fluticasone (FLONASE) 50 MCG/ACT nasal spray, Place 2 sprays into both nostrils daily for 10 days., Disp: 16 g, Rfl: 0   ibuprofen (ADVIL,MOTRIN) 200 MG tablet, Take 800 mg by mouth every 6 (six) hours as needed for headache. (Patient not taking: Reported on 01/10/2021), Disp: , Rfl:    ondansetron (ZOFRAN) 4 MG tablet, Take 1 tablet (4 mg total) by mouth every 8 (eight) hours as needed for nausea or vomiting. (Patient not taking: Reported on 01/10/2021), Disp: 20 tablet, Rfl: 3   topiramate (TOPAMAX) 25 MG tablet, Take 1 tablet (25 mg total) by mouth 2 (two) times daily. (Patient not taking: Reported on 01/10/2021), Disp: 180 tablet, Rfl: 1 Allergies  Allergen Reactions   Banana Anaphylaxis   Sulfonamide Derivatives Hives and Shortness Of Breath    Social History   Tobacco Use   Smoking status: Never   Smokeless tobacco: Never  Substance Use Topics   Alcohol use: No    Alcohol/week: 0.0 standard  drinks    Family History  Problem Relation Age of Onset   Diabetes Father    Hyperlipidemia Father    Hypertension Father    Cancer Maternal Grandmother    Hypertension Maternal Grandmother    Diabetes Paternal Grandmother    Heart disease Paternal Grandmother    Cancer Paternal Grandfather    Diabetes Other        grandparent   Breast cancer Other        grandmother      Review of Systems  Constitutional: negative for fatigue and weight loss Respiratory: negative for cough and wheezing Cardiovascular: negative for chest pain, fatigue and palpitations Gastrointestinal: negative for abdominal pain and change in bowel habits Musculoskeletal:negative for  myalgias Neurological: negative for gait problems and tremors Behavioral/Psych: negative for abusive relationship, depression Endocrine: negative for temperature intolerance    Genitourinary:positive for vaginal discharge.  negative for abnormal menstrual periods, genital lesions, hot flashes, sexual problems Integument/breast: negative for breast lump, breast tenderness, nipple discharge and skin lesion(s)    Objective:       BP 111/74   Pulse 76   Ht 5\' 7"  (1.702 m)   Wt 154 lb (69.9 kg)   BMI 24.12 kg/m  General:   Alert and no distress  Skin:   no rash or abnormalities  Lungs:   clear to auscultation bilaterally  Heart:   regular rate and rhythm, S1, S2 normal, no murmur, click, rub or gallop  Breasts:   normal without suspicious masses, skin or nipple changes or axillary nodes  Abdomen:  normal findings: no organomegaly, soft, non-tender and no hernia  Pelvis:  External genitalia: normal general appearance Urinary system: urethral meatus normal and bladder without fullness, nontender Vaginal: normal without tenderness, induration or masses Cervix: normal appearance.  IUD string visible Adnexa: normal bimanual exam Uterus: anteverted and non-tender, normal size   Lab Review Urine pregnancy test Labs reviewed yes Radiologic studies reviewed no  I have spent a total of 20 minutes of face-to-face time, excluding clinical staff time, reviewing notes and preparing to see patient, ordering tests and/or medications, and counseling the patient.   Assessment:   1. Encounter for gynecological examination with Papanicolaou smear of cervix Rx: - Cytology - PAP( [place])  2. Vaginal discharge Rx: - Cervicovaginal ancillary only( [place])  3. Screening for STD (sexually transmitted disease) Rx: - RPR+HBsAg+HCVAb+...  4. ParaGuard IUD check-up.  Pleased with IUD  5. Encounter for preconception consultation.  Considering another pregnancy in a few years - folic acid  recommended months before pregnancy for neuro tube protection  Rx: - Prenat w/o A Vit-FeFum-FePo-FA (CONCEPT OB) 130-92.4-1 MG CAPS; Take 1 capsule by mouth daily.  Dispense: 30 capsule; Refill: 11     Plan:    Education reviewed: calcium supplements, depression evaluation, low fat, low cholesterol diet, safe sex/STD prevention, self breast exams, and weight bearing exercise. Contraception: IUD. Follow up in: 1 year.   Meds ordered this encounter  Medications   Prenat w/o A Vit-FeFum-FePo-FA (CONCEPT OB) 130-92.4-1 MG CAPS    Sig: Take 1 capsule by mouth daily.    Dispense:  30 capsule    Refill:  11   Orders Placed This Encounter  Procedures   RPR+HBsAg+HCVAb+...     , MD 01/10/2021 11:48 AM

## 2021-01-11 LAB — RPR+HBSAG+HCVAB+...
HIV Screen 4th Generation wRfx: NONREACTIVE
Hep C Virus Ab: 0.2 s/co ratio (ref 0.0–0.9)
Hepatitis B Surface Ag: NEGATIVE
RPR Ser Ql: NONREACTIVE

## 2021-01-11 LAB — CERVICOVAGINAL ANCILLARY ONLY
Bacterial Vaginitis (gardnerella): NEGATIVE
Candida Glabrata: NEGATIVE
Candida Vaginitis: NEGATIVE
Chlamydia: NEGATIVE
Comment: NEGATIVE
Comment: NEGATIVE
Comment: NEGATIVE
Comment: NEGATIVE
Comment: NEGATIVE
Comment: NORMAL
Neisseria Gonorrhea: NEGATIVE
Trichomonas: NEGATIVE

## 2021-01-14 LAB — CYTOLOGY - PAP
Comment: NEGATIVE
Diagnosis: NEGATIVE
High risk HPV: NEGATIVE

## 2021-04-05 NOTE — Progress Notes (Signed)
Referring Provider Doristine Bosworth, MD [phone] [address] [place],  [place] [address]   CC:  Torn left earlobe    HPI: The patient is a 30 year old female with a history of left torn earlobe.  This was torn sometime ago and has completely healed.  The patient asked about keloids but when questioned she notes that she has never had a keloid.  She is interested in surgical repair of her left earlobe.  Allergies  Allergen Reactions   Banana Anaphylaxis   Sulfonamide Derivatives Hives and Shortness Of Breath    Outpatient Encounter Medications as of 04/06/2021  Medication Sig   amphetamine-dextroamphetamine (ADDERALL) 5 MG tablet Take 1 tablet (5 mg total) by mouth daily.   diphenhydrAMINE (BENADRYL) 25 mg capsule Take 25 mg by mouth every 6 (six) hours as needed.   ibuprofen (ADVIL,MOTRIN) 200 MG tablet Take 800 mg by mouth every 6 (six) hours as needed for headache.   topiramate (TOPAMAX) 25 MG tablet Take 1 tablet (25 mg total) by mouth 2 (two) times daily.   cetirizine (ZYRTEC) 10 MG tablet Take 1 tablet (10 mg total) by mouth daily.   fluticasone (FLONASE) 50 MCG/ACT nasal spray Place 2 sprays into both nostrils daily for 10 days.   ondansetron (ZOFRAN) 4 MG tablet Take 1 tablet (4 mg total) by mouth every 8 (eight) hours as needed for nausea or vomiting. (Patient not taking: No sig reported)   No facility-administered encounter medications on file as of 04/06/2021.     Past Medical History:  Diagnosis Date   Allergy    Anemia    GERD (gastroesophageal reflux disease)    Migraine     Past Surgical History:  Procedure Laterality Date   TONSILLECTOMY      Family History  Problem Relation Age of Onset   Diabetes Father    Hyperlipidemia Father    Hypertension Father    Cancer Maternal Grandmother    Hypertension Maternal Grandmother    Diabetes Paternal Grandmother    Heart disease Paternal Grandmother    Cancer Paternal Grandfather    Diabetes  Other        grandparent   Breast cancer Other        grandmother    Social History   Social History Narrative   Not on file     Review of Systems General: Denies fevers, chills, weight loss CV: Denies chest pain, shortness of breath, palpitations   Physical Exam Vitals with BMI 04/06/2021 01/10/2021 12/06/2018  Height 5\' 6"  5\' 7"  5\' 7"   Weight 150 lbs 154 lbs 137 lbs 13 oz  BMI 24.22 24.11 21.58  Systolic 120 111  Diastolic 77 74 70  Pulse 91 76 99  Some encounter information is confidential and restricted. Go to Review Flowsheets activity to see all data.    General:  No acute distress,  Alert and oriented, Non-Toxic, Normal speech and affect HEENT: Patient has a complete tear of the left earlobe.  No signs of abnormal scarring, completely healed edges.  Assessment/Plan We will plan for surgical repair of the earlobe by a V shaped incision.  I will then repair the defect and she may have her ear pierced in a couple months or return to me to have the ear pierced again.  We discussed that it is possible that she could have abnormal scarring or keloid but unlikely since she has never had a keloid.  04/06/2021,  9:30 AM

## 2021-04-06 ENCOUNTER — Encounter: Payer: Self-pay | Admitting: Plastic Surgery

## 2021-04-06 ENCOUNTER — Other Ambulatory Visit: Payer: Self-pay

## 2021-04-06 ENCOUNTER — Ambulatory Visit (INDEPENDENT_AMBULATORY_CARE_PROVIDER_SITE_OTHER): Payer: Self-pay | Admitting: Plastic Surgery

## 2021-04-06 VITALS — BP 120/77 | HR 91 | Ht 66.0 in | Wt 150.0 lb

## 2021-04-06 DIAGNOSIS — Z411 Encounter for cosmetic surgery: Secondary | ICD-10-CM

## 2021-04-06 DIAGNOSIS — N62 Hypertrophy of breast: Secondary | ICD-10-CM

## 2021-04-06 NOTE — Progress Notes (Signed)
Procedure note  Preoperative Dx: Left split ear lobe  Postoperative Dx: Left split ear lobe  Procedure: repair of left split ear lobe  Anesthesia: Lidocaine 1% with 1:100,000 epinepherine  Indication for Procedure: split ear lobe  Description of Procedure: Risks and complications were explained to the patient.  The patient consented to the procedure. Time out was called and all information was confirmed to be correct. The ear lobe was prepped with betadine and drapped.    Lidocaine 1% with epinepherine was injected in the subcutaneous area. After waiting several minutes for the lidocaine to take affect an #11 blade was used to make a V shaped incision which included cutting out the previous epithelialized hole. A 5-0 Prolene was used to reapproximate the skin edges on the front of the ear.  4-0 Prolene was used on the back of the earlobe.  After confirming satisfactory repair a Band-Aid was applied.  Patient is to follow up in 2 weeks. She tolerated the procedure well and there were no complications noted at the time of the procedure.

## 2021-04-18 ENCOUNTER — Ambulatory Visit (INDEPENDENT_AMBULATORY_CARE_PROVIDER_SITE_OTHER): Payer: Self-pay | Admitting: Plastic Surgery

## 2021-04-18 ENCOUNTER — Encounter: Payer: Self-pay | Admitting: Plastic Surgery

## 2021-04-18 ENCOUNTER — Other Ambulatory Visit: Payer: Self-pay

## 2021-04-18 DIAGNOSIS — Z411 Encounter for cosmetic surgery: Secondary | ICD-10-CM

## 2021-04-18 NOTE — Progress Notes (Signed)
Patient is status post left earlobe repair.  She is without complaints and is happy with her result.  Physical exam Incisions clean dry and intact, no drainage no erythema.  Assessment and plan Patient will follow-up in a couple months to have her ear pierced with [place].

## 2021-06-16 ENCOUNTER — Ambulatory Visit (INDEPENDENT_AMBULATORY_CARE_PROVIDER_SITE_OTHER): Payer: Self-pay | Admitting: Plastic Surgery

## 2021-06-16 ENCOUNTER — Other Ambulatory Visit: Payer: Self-pay

## 2021-06-16 ENCOUNTER — Ambulatory Visit: Payer: 59 | Admitting: Plastic Surgery

## 2021-06-16 DIAGNOSIS — Z411 Encounter for cosmetic surgery: Secondary | ICD-10-CM

## 2021-06-18 NOTE — Progress Notes (Signed)
Operative Note   DATE OF OPERATION: 06/18/2021  LOCATION:  clinic  SURGICAL DEPARTMENT: Plastic Surgery  PREOPERATIVE DIAGNOSES:  earlobe repaired  POSTOPERATIVE DIAGNOSES:  same  PROCEDURE:  Repiercing of left ear  SURGEON: Melton Alar. Malaiyah Achorn, MD  ANESTHESIA:  Local  COMPLICATIONS: None.   INDICATIONS FOR PROCEDURE:  The patient, Connie Cobb is a 30 y.o. female born on [birth year], is here for treatment of left earlobe MRN: [id number]  CONSENT:  Informed consent was obtained directly from the patient. Risks, benefits and alternatives were fully discussed. Specific risks including but not limited to bleeding, infection, hematoma, seroma, scarring, pain, infection, wound healing problems, and need for further surgery were all discussed. The patient did have an ample opportunity to have questions answered to satisfaction.   DESCRIPTION OF PROCEDURE:  Local anesthesia was administered. The patient's operative site was prepped and draped in a sterile fashion. A time out was performed and all information was confirmed to be correct.  The left ear was repierced without complicatons.  The patient tolerated the procedure well.  There were no complications.

## 2021-10-03 ENCOUNTER — Other Ambulatory Visit (HOSPITAL_COMMUNITY): Payer: Self-pay

## 2021-10-03 ENCOUNTER — Telehealth: Payer: 59 | Admitting: Physician Assistant

## 2021-10-03 DIAGNOSIS — T753XXA Motion sickness, initial encounter: Secondary | ICD-10-CM | POA: Diagnosis not present

## 2021-10-03 MED ORDER — SCOPOLAMINE 1 MG/3DAYS TD PT72
1.0000 | MEDICATED_PATCH | TRANSDERMAL | 0 refills | Status: DC
  Filled 2021-10-03: qty 2, 6d supply, fill #0

## 2021-10-03 NOTE — Progress Notes (Signed)
E Visit for Motion Sickness ? ?We are sorry that you are not feeling well. Here is how we plan to help! ? ?Based on what you have shared with me it looks like you have symptoms of motion sickness. ? ?I have prescribed a medication that will help prevent or alleviate your symptoms: ? ?Scopolamine Transdermal 1 mg patch behind ear at least 4 hours prior to travel (preferably 12 hours); Leave in place for 72 hours then change (apply behind opposite ear used prior) ? ? ?Prevention: ? ?You might feel better if you keep your eyes focused on outside while you are in motion. For example, if you are in a car, sit in the front and look in the direction you are moving; if you are on a boat, stay on the deck and look to the horizon. This helps make what you see match the movement you are feeling, and so you are less likely to feel sick. ? ?You should also avoid reading, watching a movie, texting or reading messages, or looking at things close to you inside the vehicle you are riding in. ? ?Use the seat head rest. Lean your head against the back of the seat or head rest when traveling in vehicles with seats to minimize head movements. ? ?On a ship: When making your reservations, choose a cabin in the middle of the ship and near the waterline. When on board, go up on deck and focus on the horizon. ? ?In an airplane: Request a window seat and look out the window. A seat over the front edge of the wing is the most preferable spot (the degree of motion is the lowest here). Direct the air vent to blow cool air on your face. ? ?On a train: Always face forward and sit near a window. ? ?In a vehicle: Sit in the front seat; if you are the passenger, look at the scenery in the distance. For some people, driving the vehicle (rather than being a passenger) is an instant remedy. ? ?Avoid others who have become nauseous with motion sickness. Seeing and smelling others who have motion sickness may cause you to become sick. ? ?GET HELP RIGHT  AWAY IF: ? ?Your symptoms do not improve or worsen within 2 days after treatment. ? ?You cannot keep down fluids after trying the medication. ? ?Other associated symptoms such as severe headache, visual field changes, fever, or intractable nausea and vomiting. ? ?MAKE SURE YOU: ? ?Understand these instructions. ?Will watch your condition. ?Will get help right away if you are not doing well or get worse. ? ?Thank you for choosing an e-visit. ? ?Your e-visit answers were reviewed by a board certified advanced clinical practitioner to complete your personal care plan. Depending upon the condition, your plan could have included both over the counter or prescription medications. ? ?Please review your pharmacy choice. Be sure that the pharmacy you have chosen is open so that you can pick up your prescription now.  If there is a problem you may message your provider in [place] to have the prescription routed to another pharmacy. ? ?Your safety is important to [place]. If you have drug allergies check your prescription carefully.  ? ?For the next 24 hours, you can use MyChart to ask questions about today's visit, request a non-urgent call back, or ask for a work or school excuse from your e-visit provider. ? ?You will get an e-mail in the next two days asking about your experience. I hope that your e-visit  has been valuable and will speed your recovery. ? ? ?References or for more information: ?ThenWeb.com.ee ?https://my.[employer].be ?https://www.uptodate.com ? ?I provided 5 minutes of non face-to-face time during this encounter for chart review and documentation.  ? ?

## 2021-10-07 ENCOUNTER — Other Ambulatory Visit (HOSPITAL_COMMUNITY): Payer: Self-pay

## 2022-01-26 ENCOUNTER — Ambulatory Visit: Payer: 59 | Admitting: Family Medicine

## 2022-01-26 ENCOUNTER — Encounter: Payer: Self-pay | Admitting: Family Medicine

## 2022-01-26 VITALS — BP 114/68 | HR 96 | Temp 98.5°F | Ht 67.0 in | Wt 163.8 lb

## 2022-01-26 DIAGNOSIS — G43809 Other migraine, not intractable, without status migrainosus: Secondary | ICD-10-CM | POA: Diagnosis not present

## 2022-01-26 DIAGNOSIS — Z975 Presence of (intrauterine) contraceptive device: Secondary | ICD-10-CM

## 2022-01-26 DIAGNOSIS — G479 Sleep disorder, unspecified: Secondary | ICD-10-CM

## 2022-01-26 DIAGNOSIS — Z803 Family history of malignant neoplasm of breast: Secondary | ICD-10-CM | POA: Diagnosis not present

## 2022-01-26 DIAGNOSIS — Z7689 Persons encountering health services in other specified circumstances: Secondary | ICD-10-CM

## 2022-01-26 NOTE — Progress Notes (Signed)
Patient presents to clinic today to establish care.  SUBJECTIVE: PMH: Patient is a 31 year old female with pmh sig for migraines was previously seen by Collie Siad, MD.  Migraines: -Started age 31 -Stress can trigger headache or decreased sleep. -Typically has pain in right frontal area of head or occipital area when related to stress. -Also has light sensitivity and nausea with headaches.  Denies aura or changes in vision. -Rx'd Topamax but not taking consistently. -May have 1 headache a week or less. -Currently taking ibuprofen migraine for headaches if needed. -Patient is not drinking caffeine.  May get 3-4 hours of sleep per night.  Working on hydration.  Sleep concerns: -Patient currently getting 3-4 hours of sleep per night.  States typically does not require much sleep. 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 -Patient endorses difficulty falling asleep.  Mind may race. -Typically does not get "good deep sleep", has phone on vibrate and wants to be able to hear her son if he needed something. -Has dinner by 7:15 pm.   -Practicing sleep hygiene by turning off TV, trying to relax prior to bed -tried OTC sleep aid without relief.  Benadryl no longer effective.  Contraception: -ParaGard IUD in place x5 years. -Patient considering having the IUD removed as she would like to conceive again prior to age 89. -Has PNV but not currently taking. -LMP 01/04/2022 -Previously seen by OB/GYN, Dr. Alwyn Ren -tried Micronor progesterone only OCPs after delivery of her son however had difficulty taking consistently.  Allergies:  Sulfa- eyedrops caused SOB, and eye edema, hives, and throat felt funny. 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  Patient has a son.  Patient denies alcohol, tobacco, drug use.  Health Maintenance: Dental --Theone Stanley, DDS [facility] -- Immunizations --influenza vaccine 2022, tetanus vaccine 2017 Colonoscopy --N/A Mammogram --N/A PAP --2022 Bone Density --N/A  Family medical history: MGM-history of breast cancer x2 status postmastectomy.  Cancer free x2 years. Paternal cousin-breast cancer diagnosed at age 74  Past Medical History:  Diagnosis Date   Allergy    Anemia    GERD (gastroesophageal reflux disease)    Migraine     Past Surgical History:  Procedure Laterality Date   TONSILLECTOMY      Current Outpatient Medications on File Prior to Visit  Medication Sig Dispense Refill   ibuprofen (ADVIL,MOTRIN) 200 MG tablet Take 800 mg by mouth every 6 (six) hours as needed for headache. PRN     ondansetron (ZOFRAN) 4 MG tablet Take 1 tablet (4 mg total) by mouth every 8 (eight) hours as needed for nausea or vomiting. (Patient taking differently: Take 4 mg by mouth every 8 (eight) hours as needed for nausea or vomiting. PRN) 20 tablet 3   topiramate (TOPAMAX) 25 MG tablet Take 1 tablet (25 mg total) by mouth 2 (two) times daily. (Patient taking differently: Take 25 mg by mouth 2 (two) times daily. PRN) 180 tablet 1   amphetamine-dextroamphetamine (ADDERALL) 5 MG tablet Take 1 tablet (5 mg total) by mouth daily. (Patient not taking: Reported on 01/26/2022) 30 tablet 0   cetirizine (ZYRTEC) 10 MG tablet Take 1 tablet (10 mg total) by mouth daily. 30 tablet 0   No current facility-administered medications on file prior to visit.  Allergies  Allergen Reactions   Banana Anaphylaxis   Sulfonamide Derivatives Hives and Shortness Of Breath    Family History  Problem Relation Age of Onset   Diabetes Father    Hyperlipidemia Father    Hypertension Father    Cancer Maternal Grandmother    Hypertension Maternal Grandmother    Diabetes Paternal Grandmother     Heart disease Paternal Grandmother    Cancer Paternal Grandfather    Diabetes Other        grandparent   Breast cancer Other        grandmother    Social History   Socioeconomic History   Marital status: Single    Spouse name: Not on file   Number of children: 1   Years of education: Not on file   Highest education level: Not on file  Occupational History   Not on file  Tobacco Use   Smoking status: Never   Smokeless tobacco: Never  Vaping Use   Vaping Use: Never used  Substance and Sexual Activity   Alcohol use: No    Alcohol/week: 0.0 standard drinks of alcohol   Drug use: No   Sexual activity: Yes    Partners: Male    Birth control/protection: I.U.D.  Other Topics Concern   Not on file  Social History Narrative   Not on file   Social Determinants of Health   Financial Resource Strain: Not on file  Food Insecurity: Not on file  Transportation Needs: Not on file  Physical Activity: Not on file  Stress: Not on file  Social Connections: Not on file  Intimate Partner Violence: Not on file    ROS General: Denies fever, chills, night sweats, changes in weight, changes in appetite HEENT: Denies headaches, ear pain, changes in vision, rhinorrhea, sore throat  + history of migraines CV: Denies CP, palpitations, SOB, orthopnea Pulm: Denies SOB, cough, wheezing GI: Denies abdominal pain, nausea, vomiting, diarrhea, constipation GU: Denies dysuria, hematuria, frequency, vaginal discharge Msk: Denies muscle cramps, joint pains Neuro: Denies weakness, numbness, tingling Skin: Denies rashes, bruising Psych: Denies depression, anxiety, hallucinations   BP 114/68 (BP Location: Right Arm, Patient Position: Sitting, Cuff Size: Normal)   Pulse 96   Temp 98.5 F (36.9 C) (Oral)   Ht 5\' 7"  (1.702 m)   Wt 163 lb 12.8 oz (74.3 kg)   LMP 01/04/2022 (Approximate)   SpO2 98%   BMI 25.65 kg/m   Physical Exam Gen. Pleasant, well developed, well-nourished, in NAD HEENT -  [place]/AT, PERRL, EOMI, conjunctive clear, no scleral icterus, no nasal drainage Lungs: no use of accessory muscles,CTAB, no wheezes, rales or rhonchi Cardiovascular: RRR,  No r/g/m, no peripheral edema Musculoskeletal: No deformities, moves all four extremities, no cyanosis or clubbing, normal tone Neuro:  A&Ox3, CN II-XII intact, normal gait Skin:  Warm, dry, intact, no lesions  No results found for this or any previous visit (from the past 2160 hour(s)).  Assessment/Plan: Other migraine without status migrainosus, not intractable -Stable -Discussed headache/migraine prevention -Consider magnesium supplements, getting plenty of rest, staying hydrated, limiting NSAID use to avoid rebound headaches. -Also discussed reducing stress -Consider restarting Topamax for increased headache frequency -Given history of migraines since age 50 also consider reestablishing with neurology.  Patient wishes to wait at this time but will place referral when ready.  Family history of breast cancer -MGM and maternal cousin with history of breast cancer -Paternal cousin diagnosed in early age, 51s discussed the importance of self exams. -Also discussed considering  starting mammograms early.  Encounter to establish care -We reviewed the PMH, PSH, FH, SH, Meds and Allergies. -We provided refills for any medications we will prescribe as needed. -We addressed current concerns per orders and patient instructions. -We have asked for records for pertinent exams, studies, vaccines and notes from previous providers. -We have advised patient to follow up per instructions below.  IUD (intrauterine device) in place -ParaGard in place x 5 yrs -Discussed starting PNV if considering removal -Discussed follow-up with OB/GYN this provider for removal when ready.  Sleep difficulties -Continue sleep hygiene -Discussed ways to reduce mind racing and decreasing stress which may help with ability to fall sleep -Consider  melatonin  F/u as needed for CPE and labs  Abbe Amsterdam, MD

## 2022-01-30 ENCOUNTER — Encounter: Payer: 59 | Admitting: Family Medicine

## 2022-02-03 ENCOUNTER — Ambulatory Visit (INDEPENDENT_AMBULATORY_CARE_PROVIDER_SITE_OTHER): Payer: 59 | Admitting: Family Medicine

## 2022-02-03 VITALS — BP 108/80 | HR 68 | Temp 98.3°F | Ht 67.0 in | Wt 160.6 lb

## 2022-02-03 DIAGNOSIS — Z13220 Encounter for screening for lipoid disorders: Secondary | ICD-10-CM

## 2022-02-03 DIAGNOSIS — R5383 Other fatigue: Secondary | ICD-10-CM

## 2022-02-03 DIAGNOSIS — Z0000 Encounter for general adult medical examination without abnormal findings: Secondary | ICD-10-CM

## 2022-02-03 DIAGNOSIS — Z30432 Encounter for removal of intrauterine contraceptive device: Secondary | ICD-10-CM | POA: Diagnosis not present

## 2022-02-03 NOTE — Progress Notes (Signed)
Subjective:     Connie Cobb is a 31 y.o. female and is here for a comprehensive physical exam and IUD removal. The patient reports no problems.  ParaGard IUD in place x5 years.  Patient still having menses with IUD in place, LMP 01/28/2022.  Patient states menses regular, every 20-21 days.  Social History   Socioeconomic History   Marital status: Single    Spouse name: Not on file   Number of children: 1   Years of education: Not on file   Highest education level: Not on file  Occupational History   Not on file  Tobacco Use   Smoking status: Never   Smokeless tobacco: Never  Vaping Use   Vaping Use: Never used  Substance and Sexual Activity   Alcohol use: No    Alcohol/week: 0.0 standard drinks of alcohol   Drug use: No   Sexual activity: Yes    Partners: Male    Birth control/protection: I.U.D.  Other Topics Concern   Not on file  Social History Narrative   Not on file   Social Determinants of Health   Financial Resource Strain: Not on file  Food Insecurity: Not on file  Transportation Needs: Not on file  Physical Activity: Not on file  Stress: Not on file  Social Connections: Not on file  Intimate Partner Violence: Not on file   Health Maintenance  Topic Date Due   COVID-19 Vaccine (2 - Booster for Janssen series) 06/24/2020   INFLUENZA VACCINE  01/31/2022   PAP SMEAR-Modifier  01/11/2024   TETANUS/TDAP  01/27/2026   Hepatitis C Screening  Completed   HIV Screening  Completed   HPV VACCINES  Aged Out    The following portions of the patient's history were reviewed and updated as appropriate: allergies, current medications, past family history, past medical history, past social history, past surgical history, and problem list.  Review of Systems Pertinent items noted in HPI and remainder of comprehensive ROS otherwise negative.   Objective:    BP 108/80 (BP Location: Right Arm, Patient Position: Sitting, Cuff Size: Normal)   Pulse 68   Temp 98.3 F (36.8  C) (Oral)   Ht 5\' 7"  (1.702 m)   Wt 160 lb 9.6 oz (72.8 kg)   LMP 01/28/2022 (Approximate)   SpO2 99%   BMI 25.15 kg/m  General appearance: alert, cooperative, and no distress Head: Normocephalic, without obvious abnormality, atraumatic Eyes: conjunctivae/corneas clear. PERRL, EOM's intact. Fundi benign. Ears: normal TM's and external ear canals both ears Nose: Nares normal. Septum midline. Mucosa normal. No drainage or sinus tenderness. Throat: lips, mucosa, and tongue normal; teeth and gums normal Neck: no adenopathy, no carotid bruit, no JVD, supple, symmetrical, trachea midline, and thyroid not enlarged, symmetric, no tenderness/mass/nodules Lungs: clear to auscultation bilaterally Heart: regular rate and rhythm, S1, S2 normal, no murmur, click, rub or gallop Abdomen: soft, non-tender; bowel sounds normal; no masses,  no organomegaly Pelvic: cervix normal in appearance, external genitalia normal, no adnexal masses or tenderness, no cervical motion tenderness, rectovaginal septum normal, uterus normal size, shape, and consistency, and IUD strings visualized.  Scant whitish-brown discharge noted in vaginal vault. IUD removed. Extremities: extremities normal, atraumatic, no cyanosis or edema Pulses: 2+ and symmetric Skin: Skin color, texture, turgor normal. No rashes or lesions Lymph nodes: Cervical, supraclavicular, and axillary nodes normal. Neurologic: Alert and oriented X 3, normal strength and tone. Normal symmetric reflexes. Normal coordination and gait    PROCEDURE NOTE: IUD removal Informed consent for  IUD removal obtained.  She is aware this will stop the birth control method provided by the IUD immediately.  Appropriate time out taken. Sterile technique used.  Patient placed in the lithotomy position and the cervix brought into view using speculum. The IUD strings were identified coming from the cervical os. These strings were grasped with ring forceps, and the IUD withdrawn  gently from the uterus. There were no complications and no blood loss.    Assessment:    Healthy female exam and IUD removal   Plan:    Anticipatory guidance given including wearing seatbelts, smoke detectors in the home, increasing physical activity, increasing p.o. intake of water and vegetables. -Labs -Pap 01/14/2021 with OB/GYN -Immunizations reviewed See After Visit Summary for Counseling Recommendations  - Plan: CBC with Differential/Platelet, TSH, T4, Free, Hemoglobin A1c, Lipid panel, CMP, Vitamin D, 25-hydroxy, Vitamin B12  Encounter for IUD removal -Consent obtained.  IUD removed.  Patient tolerated procedure well. -Advised to become pregnant. -Start PNV if planning to conceive  Fatigue, unspecified type -Likely multifactorial.  Will evaluate for thyroid dysfunction, anemia, vitamin and electrolyte deficiency - Plan: CBC with Differential/Platelet, TSH, T4, Free, Vitamin D, 25-hydroxy, Vitamin B12  Screening for lipid disorders - Plan: Lipid panel  F/u prn  Abbe [place], MD

## 2022-02-04 LAB — COMPREHENSIVE METABOLIC PANEL
AG Ratio: 2.1 (calc) (ref 1.0–2.5)
ALT: 10 U/L (ref 6–29)
AST: 15 U/L (ref 10–30)
Albumin: 5 g/dL (ref 3.6–5.1)
Alkaline phosphatase (APISO): 37 U/L (ref 31–125)
BUN: 10 mg/dL (ref 7–25)
CO2: 29 mmol/L (ref 20–32)
Calcium: 9.7 mg/dL (ref 8.6–10.2)
Chloride: 104 mmol/L (ref 98–110)
Creat: 0.84 mg/dL (ref 0.50–0.97)
Globulin: 2.4 g/dL (calc) (ref 1.9–3.7)
Glucose, Bld: 80 mg/dL (ref 65–99)
Potassium: 4.1 mmol/L (ref 3.5–5.3)
Sodium: 140 mmol/L (ref 135–146)
Total Bilirubin: 0.6 mg/dL (ref 0.2–1.2)
Total Protein: 7.4 g/dL (ref 6.1–8.1)

## 2022-02-04 LAB — LIPID PANEL
Cholesterol: 158 mg/dL (ref <200)
HDL: 59 mg/dL (ref >=50)
LDL Cholesterol (Calc): 86 mg/dL (calc)
Non-HDL Cholesterol (Calc): 99 mg/dL (calc) (ref <130)
Total CHOL/HDL Ratio: 2.7 (calc) (ref <5.0)
Triglycerides: 54 mg/dL (ref <150)

## 2022-02-04 LAB — CBC WITH DIFFERENTIAL/PLATELET
Absolute Monocytes: 461 cells/uL (ref 200–950)
Basophils Absolute: 42 cells/uL (ref 0–200)
Basophils Relative: 0.8 %
Eosinophils Absolute: 69 cells/uL (ref 15–500)
Eosinophils Relative: 1.3 %
HCT: 37.1 % (ref 35.0–45.0)
Hemoglobin: 12.5 g/dL (ref 11.7–15.5)
Lymphs Abs: 2194 cells/uL (ref 850–3900)
MCH: 28 pg (ref 27.0–33.0)
MCHC: 33.7 g/dL (ref 32.0–36.0)
MCV: 83.2 fL (ref 80.0–100.0)
MPV: 10.5 fL (ref 7.5–12.5)
Monocytes Relative: 8.7 %
Neutro Abs: 2533 cells/uL (ref 1500–7800)
Neutrophils Relative %: 47.8 %
Platelets: 289 10*3/uL (ref 140–400)
RBC: 4.46 10*6/uL (ref 3.80–5.10)
RDW: 12.3 % (ref 11.0–15.0)
Total Lymphocyte: 41.4 %
WBC: 5.3 10*3/uL (ref 3.8–10.8)

## 2022-02-04 LAB — HEMOGLOBIN A1C
Hgb A1c MFr Bld: 5.4 % of total Hgb (ref <5.7)
Mean Plasma Glucose: 108 mg/dL
eAG (mmol/L): 6 mmol/L

## 2022-02-04 LAB — VITAMIN B12: Vitamin B-12: 380 pg/mL (ref 200–1100)

## 2022-02-04 LAB — VITAMIN D 25 HYDROXY (VIT D DEFICIENCY, FRACTURES): Vit D, 25-Hydroxy: 23 ng/mL — ABNORMAL LOW (ref 30–100)

## 2022-02-04 LAB — T4, FREE: Free T4: 1.3 ng/dL (ref 0.8–1.8)

## 2022-02-04 LAB — TSH: TSH: 0.68 mIU/L

## 2022-02-09 ENCOUNTER — Other Ambulatory Visit: Payer: Self-pay | Admitting: Family Medicine

## 2022-02-09 ENCOUNTER — Other Ambulatory Visit (HOSPITAL_COMMUNITY): Payer: Self-pay

## 2022-02-09 DIAGNOSIS — E559 Vitamin D deficiency, unspecified: Secondary | ICD-10-CM

## 2022-02-09 MED ORDER — VITAMIN D (ERGOCALCIFEROL) 1.25 MG (50000 UNIT) PO CAPS
50000.0000 [IU] | ORAL_CAPSULE | ORAL | 0 refills | Status: DC
  Filled 2022-02-09: qty 12, 84d supply, fill #0

## 2022-03-10 ENCOUNTER — Emergency Department (HOSPITAL_BASED_OUTPATIENT_CLINIC_OR_DEPARTMENT_OTHER)
Admission: EM | Admit: 2022-03-10 | Discharge: 2022-03-10 | Disposition: A | Discharge status: Home / Self Care | Payer: 59 | Attending: Emergency Medicine | Admitting: Emergency Medicine

## 2022-03-10 ENCOUNTER — Encounter (HOSPITAL_BASED_OUTPATIENT_CLINIC_OR_DEPARTMENT_OTHER): Payer: Self-pay

## 2022-03-10 ENCOUNTER — Other Ambulatory Visit: Payer: Self-pay

## 2022-03-10 DIAGNOSIS — R519 Headache, unspecified: Secondary | ICD-10-CM | POA: Diagnosis present

## 2022-03-10 DIAGNOSIS — G43009 Migraine without aura, not intractable, without status migrainosus: Secondary | ICD-10-CM | POA: Diagnosis not present

## 2022-03-10 MED ORDER — DIPHENHYDRAMINE HCL 50 MG/ML IJ SOLN
12.5000 mg | Freq: Once | INTRAMUSCULAR | Status: AC
  Administered 2022-03-10: 12.5 mg via INTRAVENOUS
  Filled 2022-03-10: qty 1

## 2022-03-10 MED ORDER — PROCHLORPERAZINE EDISYLATE 10 MG/2ML IJ SOLN
10.0000 mg | Freq: Once | INTRAMUSCULAR | Status: AC
  Administered 2022-03-10: 10 mg via INTRAVENOUS
  Filled 2022-03-10: qty 2

## 2022-03-10 MED ORDER — DEXAMETHASONE SODIUM PHOSPHATE 10 MG/ML IJ SOLN
10.0000 mg | Freq: Once | INTRAMUSCULAR | Status: AC
  Administered 2022-03-10: 10 mg via INTRAVENOUS
  Filled 2022-03-10: qty 1

## 2022-03-10 MED ORDER — KETOROLAC TROMETHAMINE 15 MG/ML IJ SOLN
15.0000 mg | Freq: Once | INTRAMUSCULAR | Status: AC
  Administered 2022-03-10: 15 mg via INTRAVENOUS
  Filled 2022-03-10: qty 1

## 2022-03-10 MED ORDER — LACTATED RINGERS IV BOLUS
1000.0000 mL | Freq: Once | INTRAVENOUS | Status: AC
  Administered 2022-03-10: 1000 mL via INTRAVENOUS

## 2022-03-10 NOTE — Discharge Instructions (Addendum)
Please call [facility] for a follow-up appointment.

## 2022-03-10 NOTE — ED Triage Notes (Signed)
Migraine x 2 week. Photophobia and nausea reported.

## 2022-03-10 NOTE — ED Provider Notes (Signed)
[facility] EMERGENCY DEPARTMENT Provider Note  CSN: [id number] Arrival date & time: 03/10/22 1948  Chief Complaint(s) Migraine  HPI Connie Cobb is a 31 y.o. female with history of migraines presenting to the emergency department with headache.  Patient reports symptoms are consistent with prior migraines.  She reports headache for 2 weeks.  Denies head trauma.  Reports associated nausea, sensitivity to light, dizziness, blurry vision.  Symptoms mildly improved with ibuprofen but remain persistent.  Symptoms were gradual in onset.  No fevers or chills, neck stiffness.   Past Medical History Past Medical History:  Diagnosis Date   Allergy    Anemia    GERD (gastroesophageal reflux disease)    Migraine    Patient Active Problem List   Diagnosis Date Noted   Ringing in right ear 06/15/2014   Inattention 01/23/2013   Migraine, unspecified, without mention of intractable migraine without mention of status migrainosus 01/23/2013   Chlamydia 12/22/2011   GERD (gastroesophageal reflux disease) 10/29/2011   Sinusitis 08/21/2011   Allergic rhinitis, seasonal 08/21/2011   UTI (lower urinary tract infection) 08/21/2011   Screening for malignant neoplasm of the cervix 05/22/2011   Routine gynecological examination 05/22/2011   Family history of breast cancer 05/22/2011   Contact with or exposure to other viral diseases(V01.79) 12/01/2010   Anxiety 12/01/2010   RINGWORM 08/01/2010   VAGINITIS 08/01/2010   ANEMIA-NOS 05/19/2010   Home Medication(s) Prior to Admission medications   Medication Sig Start Date End Date Taking? Authorizing Provider  amphetamine-dextroamphetamine (ADDERALL) 5 MG tablet Take 1 tablet (5 mg total) by mouth daily. Patient not taking: Reported on 01/26/2022 04/29/19   Doristine Bosworth, MD  cetirizine (ZYRTEC) 10 MG tablet Take 1 tablet (10 mg total) by mouth daily. 07/09/18 08/08/18  Leath-Warren, Sadie Haber, NP  ibuprofen (ADVIL,MOTRIN) 200 MG tablet Take  800 mg by mouth every 6 (six) hours as needed for headache. PRN    [provider]  ondansetron (ZOFRAN) 4 MG tablet Take 1 tablet (4 mg total) by mouth every 8 (eight) hours as needed for nausea or vomiting. Patient not taking: Reported on 02/03/2022 10/07/19   Doristine Bosworth, MD  topiramate (TOPAMAX) 25 MG tablet Take 1 tablet (25 mg total) by mouth 2 (two) times daily. Patient taking differently: Take 25 mg by mouth 2 (two) times daily. PRN 12/06/18   Doristine Bosworth, MD  Vitamin D, Ergocalciferol, (DRISDOL) 1.25 MG (50000 UNIT) CAPS capsule Take 1 capsule (50,000 Units total) by mouth every 7 (seven) days. 02/09/22   Deeann Saint, MD                                                                                                                                    Past Surgical History Past Surgical History:  Procedure Laterality Date   TONSILLECTOMY     Family History Family History  Problem Relation Age of Onset  Diabetes Father    Hyperlipidemia Father    Hypertension Father    Cancer Maternal Grandmother    Hypertension Maternal Grandmother    Diabetes Paternal Grandmother    Heart disease Paternal Grandmother    Cancer Paternal Grandfather    Diabetes Other        grandparent   Breast cancer Other        grandmother    Social History Social History   Tobacco Use   Smoking status: Never   Smokeless tobacco: Never  Vaping Use   Vaping Use: Never used  Substance Use Topics   Alcohol use: No    Alcohol/week: 0.0 standard drinks of alcohol   Drug use: No   Allergies Banana and Sulfonamide derivatives  Review of Systems Review of Systems  All other systems reviewed and are negative.   Physical Exam Vital Signs  I have reviewed the triage vital signs BP 132/86   Pulse 100   Temp 97.6 F (36.4 C) (Oral)   Resp 15   Ht 5\' 7"  (1.702 m)   Wt 72.6 kg   LMP 03/06/2022   SpO2 99%   BMI 25.06 kg/m  Physical Exam Vitals and nursing note reviewed.   Constitutional:      General: She is not in acute distress.    Appearance: She is well-developed.  HENT:     Head: Normocephalic and atraumatic.     Mouth/Throat:     Mouth: Mucous membranes are moist.  Eyes:     Pupils: Pupils are equal, round, and reactive to light.  Cardiovascular:     Rate and Rhythm: Normal rate and regular rhythm.     Heart sounds: No murmur heard. Pulmonary:     Effort: Pulmonary effort is normal. No respiratory distress.     Breath sounds: Normal breath sounds.  Abdominal:     General: Abdomen is flat.     Palpations: Abdomen is soft.     Tenderness: There is no abdominal tenderness.  Musculoskeletal:        General: No tenderness.     Right lower leg: No edema.     Left lower leg: No edema.  Skin:    General: Skin is warm and dry.  Neurological:     General: No focal deficit present.     Mental Status: She is alert. Mental status is at baseline.     Comments: Cranial nerves II through XII intact, strength 5 out of 5 in the bilateral upper and lower extremities, no sensory deficit to light touch, no dysmetria on finger-nose-finger testing, ambulatory with steady gait.   Psychiatric:        Mood and Affect: Mood normal.        Behavior: Behavior normal.     ED Results and Treatments Labs (all labs ordered are listed, but only abnormal results are displayed) Labs Reviewed - No data to display  Radiology No results found.  Pertinent labs & imaging results that were available during my care of the patient were reviewed by me and considered in my medical decision making (see MDM for details).  Medications Ordered in ED Medications  prochlorperazine (COMPAZINE) injection 10 mg (10 mg Intravenous Given 03/10/22 2150)  ketorolac (TORADOL) 15 MG/ML injection 15 mg (15 mg Intravenous Given 03/10/22 2147)  lactated ringers bolus 1,000 mL  (0 mLs Intravenous Stopped 03/10/22 2304)  diphenhydrAMINE (BENADRYL) injection 12.5 mg (12.5 mg Intravenous Given 03/10/22 2147)  dexamethasone (DECADRON) injection 10 mg (10 mg Intravenous Given 03/10/22 2300)                                                                                                                                     Procedures Procedures  (including critical care time)  Medical Decision Making / ED Course   MDM:    History most consistent with migraine. Differential includes tension headache versus migraine headache.  Low concern for dangerous cause of headache such as intracranial mass or bleeding given history and exam, intracranial infectious process such as meningitis or encephalitis without fever or meningismus, or subarachnoid hemorrhage given gradual onset. Doubt carbon monoxide poisoning, temporal arteritis or glaucoma given history.    Clinical Course as of 03/11/22 1052  Fri Mar 10, 2022  2310 Headache significantly improved, reports pain is a 0.  We will treat with dexamethasone to prevent recurrence of headache.  Provided referral for neurology so patient can reestablish care. [WS]    Clinical Course User Index [WS] Lonell Grandchild, MD     Additional history obtained: -Additional history obtained from family -External records from outside source obtained and reviewed including: Chart review including previous notes, labs, imaging, consultation notes   Lab Tests: -I ordered, reviewed, and interpreted labs.   The pertinent results include:   Labs Reviewed - No data to display          Medicines ordered and prescription drug management: Meds ordered this encounter  Medications   prochlorperazine (COMPAZINE) injection 10 mg   ketorolac (TORADOL) 15 MG/ML injection 15 mg   lactated ringers bolus 1,000 mL   diphenhydrAMINE (BENADRYL) injection 12.5 mg   dexamethasone (DECADRON) injection 10 mg    -I have reviewed the patients home  medicines and have made adjustments as needed   Reevaluation: After the interventions noted above, I reevaluated the patient and found that they have resolved  Co morbidities that complicate the patient evaluation  Past Medical History:  Diagnosis Date   Allergy    Anemia    GERD (gastroesophageal reflux disease)    Migraine       Dispostion: Discharge    Final Clinical Impression(s) / ED Diagnoses Final diagnoses:  Migraine without aura and without status migrainosus, not intractable     This chart was dictated using voice recognition software.  Despite best efforts to proofread,  errors can occur which can change the documentation meaning.    Lonell Grandchild, MD 03/11/22 1052

## 2022-03-13 ENCOUNTER — Other Ambulatory Visit (HOSPITAL_COMMUNITY): Payer: Self-pay

## 2022-03-13 ENCOUNTER — Telehealth: Payer: 59 | Admitting: Physician Assistant

## 2022-03-13 DIAGNOSIS — B9689 Other specified bacterial agents as the cause of diseases classified elsewhere: Secondary | ICD-10-CM

## 2022-03-13 DIAGNOSIS — J019 Acute sinusitis, unspecified: Secondary | ICD-10-CM

## 2022-03-13 MED ORDER — AMOXICILLIN-POT CLAVULANATE 875-125 MG PO TABS
1.0000 | ORAL_TABLET | Freq: Two times a day (BID) | ORAL | 0 refills | Status: DC
  Filled 2022-03-13: qty 5, 2d supply, fill #0
  Filled 2022-03-13: qty 9, 5d supply, fill #0

## 2022-03-13 NOTE — Progress Notes (Signed)

## 2022-04-20 ENCOUNTER — Ambulatory Visit: Payer: 59 | Admitting: Family Medicine

## 2022-04-20 ENCOUNTER — Other Ambulatory Visit (HOSPITAL_COMMUNITY): Payer: Self-pay

## 2022-04-20 VITALS — BP 106/68 | HR 105 | Temp 99.0°F | Wt 160.8 lb

## 2022-04-20 DIAGNOSIS — G43009 Migraine without aura, not intractable, without status migrainosus: Secondary | ICD-10-CM

## 2022-04-20 DIAGNOSIS — G4700 Insomnia, unspecified: Secondary | ICD-10-CM

## 2022-04-20 DIAGNOSIS — F900 Attention-deficit hyperactivity disorder, predominantly inattentive type: Secondary | ICD-10-CM | POA: Diagnosis not present

## 2022-04-20 DIAGNOSIS — R5383 Other fatigue: Secondary | ICD-10-CM

## 2022-04-20 MED ORDER — HYDROXYZINE HCL 25 MG PO TABS
25.0000 mg | ORAL_TABLET | Freq: Every evening | ORAL | 0 refills | Status: DC | PRN
  Filled 2022-04-20: qty 30, 30d supply, fill #0

## 2022-04-20 MED ORDER — AMPHETAMINE-DEXTROAMPHETAMINE 5 MG PO TABS
5.0000 mg | ORAL_TABLET | Freq: Every day | ORAL | 0 refills | Status: DC
  Filled 2022-04-20: qty 30, 30d supply, fill #0

## 2022-04-20 MED ORDER — RIZATRIPTAN BENZOATE 5 MG PO TABS
5.0000 mg | ORAL_TABLET | ORAL | 2 refills | Status: DC | PRN
  Filled 2022-04-20: qty 10, 30d supply, fill #0
  Filled 2022-07-24: qty 10, 30d supply, fill #1
  Filled 2022-09-15 – 2022-10-06 (×2): qty 10, 30d supply, fill #2

## 2022-04-20 MED ORDER — ONDANSETRON HCL 4 MG PO TABS
4.0000 mg | ORAL_TABLET | Freq: Three times a day (TID) | ORAL | 0 refills | Status: DC | PRN
  Filled 2022-04-20: qty 20, 7d supply, fill #0

## 2022-04-20 NOTE — Progress Notes (Signed)
Subjective:    Patient ID: Connie Cobb, female    DOB: [birth year], 31 y.o.   MRN: [id number]  Chief Complaint  Patient presents with   Medication Refill    Discuss getting back on adderall, is not able to focus or get task done. Not sure if it is hormones. Is always tired, tried, tried benadryl but does not like how it made her feel.      HPI Patient was seen today for f/u.  Pt notes increased difficulty with concentrating at work.  Previously on Adderall 5 mg daily.  Would like to restart med.  Pt getting 5-6 hours of sleep per night, but not deep sleep.  Benadryl no longer making drowsy.  Patient endorses continued decreased energy despite starting vitamin D supplementation.  Taking OTC multivitamin and extra vitamin D with no improvement.  Pt seen at [facility] 03/10/22 for migraine.  States had a continuous HA x days.  Given migraine cocktail which helped, but made pt feel anxious.  Pt states unable to go to Neurologist 2/2 busy schedule.  Pt has rx for Topiramate, but taking prn not daily.  Last taken over a month ago.  Increased stress typically triggers a HA.    Past Medical History:  Diagnosis Date   Allergy    Anemia    GERD (gastroesophageal reflux disease)    Migraine     Allergies  Allergen Reactions   Banana Anaphylaxis   Sulfonamide Derivatives Hives and Shortness Of Breath    ROS General: Denies fever, chills, night sweats, changes in weight, changes in appetite +insomnia HEENT: Denies headaches, ear pain, changes in vision, rhinorrhea, sore throat  +migraines CV: Denies CP, palpitations, SOB, orthopnea Pulm: Denies SOB, cough, wheezing GI: Denies abdominal pain, nausea, vomiting, diarrhea, constipation GU: Denies dysuria, hematuria, frequency, vaginal discharge Msk: Denies muscle cramps, joint pains Neuro: Denies weakness, numbness, tingling Skin: Denies rashes, bruising Psych: Denies depression, anxiety, hallucinations +inattention, decreased  concentration     Objective:    Blood pressure 106/68, pulse (!) 105, temperature 99 F (37.2 C), temperature source Oral, weight 160 lb 12.8 oz (72.9 kg), SpO2 96 %.   Gen. Pleasant, well-nourished, in no distress, normal affect   HEENT: [place]/AT, face symmetric, conjunctiva clear, no scleral icterus, PERRLA, EOMI, no nystagmus, nares patent without drainage. Lungs: no accessory muscle use, CTAB, no wheezes or rales Cardiovascular: RRR, no m/r/g, no peripheral edema Neuro:  A&Ox3, CN II-XII intact, normal gait Skin:  Warm, no lesions/ rash   Wt Readings from Last 3 Encounters:  04/20/22 160 lb 12.8 oz (72.9 kg)  03/10/22 160 lb (72.6 kg)  02/03/22 160 lb 9.6 oz (72.8 kg)    Lab Results  Component Value Date   WBC 5.3 02/03/2022   HGB 12.5 02/03/2022   HCT 37.1 02/03/2022   PLT 289 02/03/2022   GLUCOSE 80 02/03/2022   CHOL 158 02/03/2022   TRIG 54 02/03/2022   HDL 59 02/03/2022   LDLCALC 86 02/03/2022   ALT 10 02/03/2022   AST 15 02/03/2022   NA 140 02/03/2022   K 4.1 02/03/2022   CL 104 02/03/2022   CREATININE 0.84 02/03/2022   BUN 10 02/03/2022   CO2 29 02/03/2022   TSH 0.68 02/03/2022   HGBA1C 5.4 02/03/2022    Assessment/Plan:  Migraine without aura and without status migrainosus, not intractable  -stable -Pt having fewer than 7 migraines per month consider abortive medication alone. -will have pt stop Topamax as not taking  consistently. -We will try Maxalt -Zofran refilled as patient has nausea with headaches -For continued or worsening symptoms will need neurology follow-up. - Plan: rizatriptan (MAXALT) 5 MG tablet, ondansetron (ZOFRAN) 4 MG tablet  Fatigue, unspecified type -Likely multifactorial including decreased sleep, stress -Exercise encouraged -Continue vitamin D supplementation  Insomnia, unspecified type -Continue sleep hygiene -Consider sleep study for continued or worsening symptoms - Plan: hydrOXYzine (ATARAX) 25 MG tablet  Attention  deficit hyperactivity disorder (ADHD), predominantly inattentive type -Previously on Adderall 5 mg -Restart Adderall 5 mg daily -PDMP reviewed -Decrease sleep may also be contributing to symptoms.  - Plan: amphetamine-dextroamphetamine (ADDERALL) 5 MG tablet  F/u prn  Grier Mitts, MD

## 2022-06-14 ENCOUNTER — Other Ambulatory Visit: Payer: Self-pay | Admitting: Family Medicine

## 2022-06-14 DIAGNOSIS — F900 Attention-deficit hyperactivity disorder, predominantly inattentive type: Secondary | ICD-10-CM

## 2022-06-14 DIAGNOSIS — G4700 Insomnia, unspecified: Secondary | ICD-10-CM

## 2022-06-16 ENCOUNTER — Other Ambulatory Visit (HOSPITAL_COMMUNITY): Payer: Self-pay

## 2022-06-16 MED ORDER — AMPHETAMINE-DEXTROAMPHETAMINE 5 MG PO TABS
5.0000 mg | ORAL_TABLET | Freq: Every day | ORAL | 0 refills | Status: DC
  Filled 2022-06-16: qty 90, 90d supply, fill #0

## 2022-06-16 MED ORDER — HYDROXYZINE HCL 25 MG PO TABS
25.0000 mg | ORAL_TABLET | Freq: Every evening | ORAL | 1 refills | Status: DC | PRN
  Filled 2022-06-16: qty 90, 90d supply, fill #0

## 2022-09-15 ENCOUNTER — Other Ambulatory Visit: Payer: Self-pay | Admitting: Family Medicine

## 2022-09-15 ENCOUNTER — Other Ambulatory Visit (HOSPITAL_BASED_OUTPATIENT_CLINIC_OR_DEPARTMENT_OTHER): Payer: Self-pay

## 2022-09-15 ENCOUNTER — Other Ambulatory Visit (HOSPITAL_COMMUNITY): Payer: Self-pay

## 2022-09-15 ENCOUNTER — Ambulatory Visit
Admission: EM | Admit: 2022-09-15 | Discharge: 2022-09-15 | Disposition: A | Discharge status: Home / Self Care | Payer: Commercial Managed Care - PPO | Attending: Internal Medicine | Admitting: Internal Medicine

## 2022-09-15 DIAGNOSIS — R000 Tachycardia, unspecified: Secondary | ICD-10-CM | POA: Diagnosis not present

## 2022-09-15 DIAGNOSIS — F900 Attention-deficit hyperactivity disorder, predominantly inattentive type: Secondary | ICD-10-CM

## 2022-09-15 DIAGNOSIS — J111 Influenza due to unidentified influenza virus with other respiratory manifestations: Secondary | ICD-10-CM | POA: Diagnosis not present

## 2022-09-15 DIAGNOSIS — J309 Allergic rhinitis, unspecified: Secondary | ICD-10-CM | POA: Diagnosis not present

## 2022-09-15 LAB — POCT INFLUENZA A/B
Influenza A, POC: NEGATIVE
Influenza B, POC: NEGATIVE

## 2022-09-15 MED ORDER — PREDNISONE 10 MG PO TABS
30.0000 mg | ORAL_TABLET | Freq: Every day | ORAL | 0 refills | Status: DC
  Filled 2022-09-15: qty 15, 5d supply, fill #0

## 2022-09-15 MED ORDER — OSELTAMIVIR PHOSPHATE 75 MG PO CAPS
75.0000 mg | ORAL_CAPSULE | Freq: Two times a day (BID) | ORAL | 0 refills | Status: DC
  Filled 2022-09-15: qty 10, 5d supply, fill #0

## 2022-09-15 MED ORDER — ACETAMINOPHEN 325 MG PO TABS
650.0000 mg | ORAL_TABLET | Freq: Once | ORAL | Status: AC
  Administered 2022-09-15: 650 mg via ORAL

## 2022-09-15 MED ORDER — CETIRIZINE HCL 10 MG PO TABS
10.0000 mg | ORAL_TABLET | Freq: Every day | ORAL | 0 refills | Status: AC
  Filled 2022-09-15: qty 100, 100d supply, fill #0

## 2022-09-15 MED ORDER — PROMETHAZINE-DM 6.25-15 MG/5ML PO SYRP
5.0000 mL | ORAL_SOLUTION | Freq: Three times a day (TID) | ORAL | 0 refills | Status: DC | PRN
  Filled 2022-09-15: qty 200, 14d supply, fill #0

## 2022-09-15 MED ORDER — IPRATROPIUM BROMIDE 0.03 % NA SOLN
2.0000 | Freq: Two times a day (BID) | NASAL | 0 refills | Status: DC
  Filled 2022-09-15: qty 30, 30d supply, fill #0

## 2022-09-15 MED ORDER — IPRATROPIUM BROMIDE 0.03 % NA SOLN
2.0000 | Freq: Two times a day (BID) | NASAL | 0 refills | Status: AC
  Filled 2022-09-15: qty 30, 75d supply, fill #0

## 2022-09-15 MED ORDER — PROMETHAZINE-DM 6.25-15 MG/5ML PO SYRP
5.0000 mL | ORAL_SOLUTION | Freq: Three times a day (TID) | ORAL | 0 refills | Status: DC | PRN
Start: 2022-09-15 — End: 2022-10-09
  Filled 2022-09-15: qty 200, 14d supply, fill #0

## 2022-09-15 MED ORDER — CETIRIZINE HCL 10 MG PO TABS
10.0000 mg | ORAL_TABLET | Freq: Every day | ORAL | 0 refills | Status: DC
  Filled 2022-09-15: qty 30, 30d supply, fill #0

## 2022-09-15 NOTE — Discharge Instructions (Addendum)
We will manage this as a viral syndrome like influenza with Tamiflu. For sore throat or cough try using a honey-based tea. Use 3 teaspoons of honey with juice squeezed from half lemon. Place shaved pieces of ginger into 1/2-1 cup of water and warm over stove top. Then mix the ingredients and repeat every 4 hours as needed. Please take Tylenol 500mg -650mg  once every 6 hours for fevers, aches and pains. Hydrate very well with at least 2 liters (64 ounces) of water. Eat light meals such as soups (chicken and noodles, chicken wild rice, vegetable).  Do not eat any foods that you are allergic to.  Start an antihistamine like Zyrtec (10mg  daily) for postnasal drainage, sinus congestion.  You can take this together with prednisone.  Use cough medications.

## 2022-09-15 NOTE — ED Triage Notes (Signed)
Pt c/o HA x 3 days-bilat hip/body aches x 2 days-low grade fever x today-reports +flu exposure-NAD-steady gait

## 2022-09-15 NOTE — ED Provider Notes (Addendum)
[facility]RG[facility]  Note:  This document was prepared using Systems analyst and may include unintentional dictation errors.  MRN: [id number] DOB: June 30, 1991  Subjective:   Connie Cobb is a 32 y.o. female presenting for 2 to 3-day history of persistent fever, body aches, body pains, headaches, coughing, chills.  Feels like she is worsening in the past day with wheezing.  Has been taking care of her son who was diagnosed with influenza.  She has been using multiple over-the-counter medications for herself.  Has a history of allergic rhinitis, sinusitis.  No smoking of any kind.  No current facility-administered medications for this encounter.  Current Outpatient Medications:    amphetamine-dextroamphetamine (ADDERALL) 5 MG tablet, Take 1 tablet (5 mg total) by mouth daily., Disp: 90 tablet, Rfl: 0   cetirizine (ZYRTEC) 10 MG tablet, Take 1 tablet (10 mg total) by mouth daily., Disp: 30 tablet, Rfl: 0   hydrOXYzine (ATARAX) 25 MG tablet, Take 1 tablet (25 mg total) by mouth at bedtime as needed., Disp: 90 tablet, Rfl: 1   ibuprofen (ADVIL,MOTRIN) 200 MG tablet, Take 800 mg by mouth every 6 (six) hours as needed for headache. PRN, Disp: , Rfl:    ondansetron (ZOFRAN) 4 MG tablet, Take 1 tablet (4 mg total) by mouth every 8 (eight) hours as needed for nausea or vomiting., Disp: 20 tablet, Rfl: 0   rizatriptan (MAXALT) 5 MG tablet, Take 1 tablet (5 mg total) by mouth as needed for migraine. May repeat in 2 hours if needed, Disp: 10 tablet, Rfl: 2   topiramate (TOPAMAX) 25 MG tablet, Take 1 tablet (25 mg total) by mouth 2 (two) times daily. (Patient taking differently: Take 25 mg by mouth 2 (two) times daily. PRN), Disp: 180 tablet, Rfl: 1   Vitamin D, Ergocalciferol, (DRISDOL) 1.25 MG (50000 UNIT) CAPS capsule, Take 1 capsule (50,000 Units total) by mouth every 7 (seven) days., Disp: 12 capsule, Rfl: 0   Allergies  Allergen Reactions   Banana Anaphylaxis    Sulfonamide Derivatives Hives and Shortness Of Breath    Past Medical History:  Diagnosis Date   Allergy    Anemia    GERD (gastroesophageal reflux disease)    Migraine      Past Surgical History:  Procedure Laterality Date   TONSILLECTOMY      Family History  Problem Relation Age of Onset   Diabetes Father    Hyperlipidemia Father    Hypertension Father    Cancer Maternal Grandmother    Hypertension Maternal Grandmother    Diabetes Paternal Grandmother    Heart disease Paternal Grandmother    Cancer Paternal Grandfather    Diabetes Other        grandparent   Breast cancer Other        grandmother    Social History   Tobacco Use   Smoking status: Never   Smokeless tobacco: Never  Vaping Use   Vaping Use: Never used  Substance Use Topics   Alcohol use: No    Alcohol/week: 0.0 standard drinks of alcohol   Drug use: No    ROS   Objective:   Vitals: BP 124/79 (BP Location: Right Arm)   Pulse (!) 126   Temp (!) 100.8 F (38.2 C) (Oral)   Resp 18   LMP 09/06/2022 (Approximate)   SpO2 97%   Physical Exam Constitutional:      General: She is not in acute distress.    Appearance: Normal appearance. She  is well-developed and normal weight. She is not ill-appearing, toxic-appearing or diaphoretic.  HENT:     Head: Normocephalic and atraumatic.     Right Ear: Tympanic membrane, ear canal and external ear normal. No drainage or tenderness. No middle ear effusion. There is no impacted cerumen. Tympanic membrane is not erythematous or bulging.     Left Ear: Tympanic membrane, ear canal and external ear normal. No drainage or tenderness.  No middle ear effusion. There is no impacted cerumen. Tympanic membrane is not erythematous or bulging.     Nose: Congestion present. No rhinorrhea.     Mouth/Throat:     Mouth: Mucous membranes are moist. No oral lesions.     Pharynx: No pharyngeal swelling, oropharyngeal exudate, posterior oropharyngeal erythema or uvula  swelling.     Tonsils: No tonsillar exudate or tonsillar abscesses.     Comments: Thick wall of postnasal drainage overlying pharynx. Eyes:     General: No scleral icterus.       Right eye: No discharge.        Left eye: No discharge.     Extraocular Movements: Extraocular movements intact.     Right eye: Normal extraocular motion.     Left eye: Normal extraocular motion.     Conjunctiva/sclera: Conjunctivae normal.     Pupils: Pupils are equal, round, and reactive to light. Pupils are equal.  Cardiovascular:     Rate and Rhythm: Regular rhythm. Tachycardia present.     Heart sounds: Normal heart sounds. No murmur heard.    No friction rub. No gallop.  Pulmonary:     Effort: Pulmonary effort is normal. No respiratory distress.     Breath sounds: No stridor. No wheezing, rhonchi or rales.  Chest:     Chest wall: No tenderness.  Musculoskeletal:     Cervical back: Normal range of motion and neck supple.  Lymphadenopathy:     Cervical: No cervical adenopathy.  Skin:    General: Skin is warm and dry.  Neurological:     General: No focal deficit present.     Mental Status: She is alert and oriented to person, place, and time.     Cranial Nerves: No cranial nerve deficit.     Motor: No weakness.     Coordination: Coordination normal.     Gait: Gait normal.  Psychiatric:        Mood and Affect: Mood normal.        Behavior: Behavior normal.     Results for orders placed or performed during the hospital encounter of 09/15/22 (from the past 24 hour(s))  POCT Influenza A/B     Status: None   Collection Time: 09/15/22  4:55 PM  Result Value Ref Range   Influenza A, POC Negative Negative   Influenza B, POC Negative Negative    Assessment and Plan :   PDMP not reviewed this encounter.  1. Influenza   2. Allergic rhinitis, unspecified seasonality, unspecified trigger   3. Tachycardia, unspecified     Advised that she avoid over-the-counter cough and cold medications as the  decongestants and then may be causing her tachycardia in addition to her illness.  Deferred imaging given clear cardiopulmonary exam, hemodynamically stable vital signs.  Patient requested aggressive management, would like to undergo a prednisone course given the severity of her symptoms, wheezing.  Will cover for influenza with Tamiflu given very close exposure with her son, symptom set, current incidence in the community.  Use supportive care, rest, fluids,  hydration, light meals, schedule Tylenol and ibuprofen. Counseled patient on potential for adverse effects with medications prescribed today, patient verbalized understanding. ER and return-to-clinic precautions discussed, patient verbalized understanding.    Jaynee Eagles, [place] 09/15/22 1801

## 2022-09-18 ENCOUNTER — Other Ambulatory Visit: Payer: Self-pay

## 2022-09-25 ENCOUNTER — Telehealth: Payer: Commercial Managed Care - PPO | Admitting: Family Medicine

## 2022-09-25 ENCOUNTER — Other Ambulatory Visit (HOSPITAL_COMMUNITY): Payer: Self-pay

## 2022-09-25 DIAGNOSIS — J019 Acute sinusitis, unspecified: Secondary | ICD-10-CM

## 2022-09-25 DIAGNOSIS — B9689 Other specified bacterial agents as the cause of diseases classified elsewhere: Secondary | ICD-10-CM | POA: Diagnosis not present

## 2022-09-25 MED ORDER — AMOXICILLIN-POT CLAVULANATE 875-125 MG PO TABS: 1.0000 | ORAL_TABLET | Freq: Two times a day (BID) | ORAL | 0 refills | Status: AC

## 2022-09-25 NOTE — Progress Notes (Signed)

## 2022-10-03 ENCOUNTER — Other Ambulatory Visit: Payer: Self-pay | Admitting: Family Medicine

## 2022-10-03 ENCOUNTER — Other Ambulatory Visit (HOSPITAL_COMMUNITY): Payer: Self-pay

## 2022-10-03 DIAGNOSIS — F900 Attention-deficit hyperactivity disorder, predominantly inattentive type: Secondary | ICD-10-CM

## 2022-10-04 NOTE — Telephone Encounter (Signed)
Prescription Request  10/04/2022  LOV: 04/20/2022  What is the name of the medication or equipment?    Amphetamine-dextroamphetamine (ADDERALL) 5 MG Tablet   Pt states she is completely out of this medication and it is really affecting her performance at work.  Pt states she has been waiting for the refill and sent several requests for over a month now and needs to know what she has to do to get this resolved?    Pt is asking if MD needs her to come in for an OV?  Please advise.   Have you contacted your pharmacy to request a refill? Yes   Which pharmacy would you like this sent to?   [facility] (Ph: [phone])      Patient notified that their request is being sent to the clinical staff for review and that they should receive a response within 2 business days.   Please advise at Mobile [phone] (mobile)

## 2022-10-06 ENCOUNTER — Other Ambulatory Visit (HOSPITAL_COMMUNITY): Payer: Self-pay

## 2022-10-06 ENCOUNTER — Other Ambulatory Visit: Payer: Self-pay

## 2022-10-06 MED ORDER — AMPHETAMINE-DEXTROAMPHETAMINE 5 MG PO TABS
5.0000 mg | ORAL_TABLET | Freq: Every day | ORAL | 0 refills | Status: DC
  Filled 2022-10-06: qty 30, 30d supply, fill #0

## 2022-10-06 NOTE — Telephone Encounter (Signed)
Contacted pt and scheduled a video visit on April 8,2024 at 11:45am.

## 2022-10-09 ENCOUNTER — Other Ambulatory Visit (HOSPITAL_COMMUNITY): Payer: Self-pay

## 2022-10-09 ENCOUNTER — Encounter: Payer: Self-pay | Admitting: Family Medicine

## 2022-10-09 ENCOUNTER — Telehealth: Payer: Commercial Managed Care - PPO | Admitting: Family Medicine

## 2022-10-09 VITALS — Wt 155.0 lb

## 2022-10-09 DIAGNOSIS — F439 Reaction to severe stress, unspecified: Secondary | ICD-10-CM | POA: Diagnosis not present

## 2022-10-09 DIAGNOSIS — F900 Attention-deficit hyperactivity disorder, predominantly inattentive type: Secondary | ICD-10-CM

## 2022-10-09 DIAGNOSIS — G43009 Migraine without aura, not intractable, without status migrainosus: Secondary | ICD-10-CM

## 2022-10-09 MED ORDER — ONDANSETRON HCL 4 MG PO TABS
4.0000 mg | ORAL_TABLET | Freq: Three times a day (TID) | ORAL | 1 refills | Status: DC | PRN
  Filled 2022-10-09: qty 20, 7d supply, fill #0

## 2022-10-09 MED ORDER — RIZATRIPTAN BENZOATE 5 MG PO TABS
5.0000 mg | ORAL_TABLET | ORAL | 5 refills | Status: DC | PRN
  Filled 2022-10-09: qty 10, 30d supply, fill #0

## 2022-10-09 MED ORDER — AMPHETAMINE-DEXTROAMPHETAMINE 20 MG PO TABS
20.0000 mg | ORAL_TABLET | Freq: Every day | ORAL | 0 refills | Status: DC
  Filled 2022-10-09: qty 90, 90d supply, fill #0

## 2022-10-09 NOTE — Progress Notes (Signed)
Virtual Visit via Video Note  I connected with Connie Cobb on 10/09/22 at 11:45 AM EDT by a video enabled telemedicine application and verified that I am speaking with the correct person using two identifiers.  Location patient: home Location provider:work or home office Persons participating in the virtual visit: patient, provider  I discussed the limitations of evaluation and management by telemedicine and the availability of in person appointments. The patient expressed understanding and agreed to proceed.  Chief Complaint  Patient presents with   Medical Management of Chronic Issues    HPI: Pt is a 32 yo female with pmh sig for anxiety, anemia, GERD, Migraines, ADHD seen for f/u and med refills.  Pt has been sick off and on for the last few months.  Got the flu from her son.  Pt stared Adderall 5 mg daily.  Increased dose as previously advised.  Notes little improvement in concentration at lower dosing.  Twice daily dosing worsened insomnia.  Still working on setting boundaries at work for a better work life balance.    Improvement in sleep.  Hydroxyzine 25 mg prn helpful.  No recent migraines.  Maxalt helped when used.   ROS: See pertinent positives and negatives per HPI.  Past Medical History:  Diagnosis Date   Allergy    Anemia    GERD (gastroesophageal reflux disease)    Migraine     Past Surgical History:  Procedure Laterality Date   TONSILLECTOMY      Family History  Problem Relation Age of Onset   Diabetes Father    Hyperlipidemia Father    Hypertension Father    Cancer Maternal Grandmother    Hypertension Maternal Grandmother    Diabetes Paternal Grandmother    Heart disease Paternal Grandmother    Cancer Paternal Grandfather    Diabetes Other        grandparent   Breast cancer Other        grandmother    Current Outpatient Medications:    amphetamine-dextroamphetamine (ADDERALL) 5 MG tablet, Take 1 tablet (5 mg total) by mouth daily., Disp: 30  tablet, Rfl: 0   cetirizine (ZYRTEC ALLERGY) 10 MG tablet, Take 1 tablet (10 mg total) by mouth daily., Disp: 100 tablet, Rfl: 0   hydrOXYzine (ATARAX) 25 MG tablet, Take 1 tablet (25 mg total) by mouth at bedtime as needed., Disp: 90 tablet, Rfl: 1   ipratropium (ATROVENT) 0.03 % nasal spray, Place 2 sprays into both nostrils 2 (two) times daily., Disp: 30 mL, Rfl: 0   ondansetron (ZOFRAN) 4 MG tablet, Take 1 tablet (4 mg total) by mouth every 8 (eight) hours as needed for nausea or vomiting., Disp: 20 tablet, Rfl: 0   rizatriptan (MAXALT) 5 MG tablet, Take 1 tablet (5 mg total) by mouth as needed for migraine. May repeat in 2 hours if needed, Disp: 10 tablet, Rfl: 2  EXAM: Wt 155 lb (70.3 kg)   LMP 09/06/2022 (Approximate)   BMI 24.28 kg/m   VITALS per patient if applicable: RR between 12-20 bpm  GENERAL: alert, oriented, appears well and in no acute distress  HEENT: atraumatic, conjunctiva clear, no obvious abnormalities on inspection of external nose and ears  NECK: normal movements of the head and neck  LUNGS: on inspection no signs of respiratory distress, breathing rate appears normal, no obvious gross SOB, gasping or wheezing  CV: no obvious cyanosis  MS: moves all visible extremities without noticeable abnormality  PSYCH/NEURO: pleasant and cooperative, no obvious depression or anxiety,  speech and thought processing grossly intact  ASSESSMENT AND PLAN:  Discussed the following assessment and plan:  Attention deficit hyperactivity disorder (ADHD), predominantly inattentive type -Adderall 5 mg and 10 mg daily ineffective -PDMP reviewed -Adderall 20 mg daily.  Monitor for improvement symptoms.  - Plan: amphetamine-dextroamphetamine (ADDERALL) 20 MG tablet  Migraine without aura and without status migrainosus, not intractable -improving -continue HA/migraine prevention  - Plan: ondansetron (ZOFRAN) 4 MG tablet, rizatriptan (MAXALT) 5 MG tablet  Stress -self care,  boundaries -hydroxyzine prn -consider counseling and other medication options for continued or worsening symptoms.  F/u in 3 months, sooner if needed.   I discussed the assessment and treatment plan with the patient. The patient was provided an opportunity to ask questions and all were answered. The patient agreed with the plan and demonstrated an understanding of the instructions.   The patient was advised to call back or seek an in-person evaluation if the symptoms worsen or if the condition fails to improve as anticipated.   Deeann Saint, MD

## 2023-01-16 DIAGNOSIS — H52223 Regular astigmatism, bilateral: Secondary | ICD-10-CM | POA: Diagnosis not present

## 2023-02-05 ENCOUNTER — Other Ambulatory Visit (HOSPITAL_COMMUNITY): Payer: Self-pay

## 2023-02-05 ENCOUNTER — Ambulatory Visit: Payer: Commercial Managed Care - PPO | Admitting: Family Medicine

## 2023-02-05 ENCOUNTER — Encounter: Payer: Self-pay | Admitting: Family Medicine

## 2023-02-05 VITALS — BP 124/80 | HR 70 | Temp 98.0°F | Ht 67.5 in | Wt 147.2 lb

## 2023-02-05 DIAGNOSIS — G4700 Insomnia, unspecified: Secondary | ICD-10-CM | POA: Diagnosis not present

## 2023-02-05 DIAGNOSIS — Z0000 Encounter for general adult medical examination without abnormal findings: Secondary | ICD-10-CM | POA: Diagnosis not present

## 2023-02-05 DIAGNOSIS — Z113 Encounter for screening for infections with a predominantly sexual mode of transmission: Secondary | ICD-10-CM | POA: Diagnosis not present

## 2023-02-05 DIAGNOSIS — G43009 Migraine without aura, not intractable, without status migrainosus: Secondary | ICD-10-CM

## 2023-02-05 DIAGNOSIS — F900 Attention-deficit hyperactivity disorder, predominantly inattentive type: Secondary | ICD-10-CM

## 2023-02-05 LAB — LIPID PANEL
Cholesterol: 151 mg/dL (ref 0–200)
HDL: 57.4 mg/dL (ref >39.00)
LDL Cholesterol: 82 mg/dL (ref 0–99)
NonHDL: 94.07
Total CHOL/HDL Ratio: 3
Triglycerides: 61 mg/dL (ref 0.0–149.0)
VLDL: 12.2 mg/dL (ref 0.0–40.0)

## 2023-02-05 LAB — TSH: TSH: 0.52 u[IU]/mL (ref 0.35–5.50)

## 2023-02-05 LAB — CBC WITH DIFFERENTIAL/PLATELET
Basophils Absolute: 0 10*3/uL (ref 0.0–0.1)
Basophils Relative: 0.8 % (ref 0.0–3.0)
Eosinophils Absolute: 0.1 10*3/uL (ref 0.0–0.7)
Eosinophils Relative: 1 % (ref 0.0–5.0)
HCT: 39 % (ref 36.0–46.0)
Hemoglobin: 12.6 g/dL (ref 12.0–15.0)
Lymphocytes Relative: 46.9 % — ABNORMAL HIGH (ref 12.0–46.0)
Lymphs Abs: 2.5 10*3/uL (ref 0.7–4.0)
MCHC: 32.3 g/dL (ref 30.0–36.0)
MCV: 82.6 fl (ref 78.0–100.0)
Monocytes Absolute: 0.5 10*3/uL (ref 0.1–1.0)
Monocytes Relative: 9.4 % (ref 3.0–12.0)
Neutro Abs: 2.2 10*3/uL (ref 1.4–7.7)
Neutrophils Relative %: 41.9 % — ABNORMAL LOW (ref 43.0–77.0)
Platelets: 279 10*3/uL (ref 150.0–400.0)
RBC: 4.72 Mil/uL (ref 3.87–5.11)
RDW: 13.4 % (ref 11.5–15.5)
WBC: 5.2 10*3/uL (ref 4.0–10.5)

## 2023-02-05 LAB — COMPREHENSIVE METABOLIC PANEL
ALT: 10 U/L (ref 0–35)
AST: 16 U/L (ref 0–37)
Albumin: 4.9 g/dL (ref 3.5–5.2)
Alkaline Phosphatase: 35 U/L — ABNORMAL LOW (ref 39–117)
BUN: 13 mg/dL (ref 6–23)
CO2: 27 mEq/L (ref 19–32)
Calcium: 9.4 mg/dL (ref 8.4–10.5)
Chloride: 105 mEq/L (ref 96–112)
Creatinine, Ser: 0.77 mg/dL (ref 0.40–1.20)
GFR: 101.84 mL/min (ref >60.00)
Glucose, Bld: 89 mg/dL (ref 70–99)
Potassium: 4 mEq/L (ref 3.5–5.1)
Sodium: 139 mEq/L (ref 135–145)
Total Bilirubin: 0.4 mg/dL (ref 0.2–1.2)
Total Protein: 7.5 g/dL (ref 6.0–8.3)

## 2023-02-05 LAB — HEMOGLOBIN A1C: Hgb A1c MFr Bld: 5.8 % (ref 4.6–6.5)

## 2023-02-05 MED ORDER — AMPHETAMINE-DEXTROAMPHETAMINE 30 MG PO TABS
30.0000 mg | ORAL_TABLET | Freq: Every day | ORAL | 0 refills | Status: DC
  Filled 2023-02-05: qty 90, 90d supply, fill #0

## 2023-02-05 MED ORDER — RIZATRIPTAN BENZOATE 5 MG PO TABS
5.0000 mg | ORAL_TABLET | ORAL | 5 refills | Status: DC | PRN
  Filled 2023-02-05: qty 10, 30d supply, fill #0

## 2023-02-05 MED ORDER — HYDROXYZINE HCL 25 MG PO TABS
25.0000 mg | ORAL_TABLET | Freq: Every evening | ORAL | 3 refills | Status: DC | PRN
  Filled 2023-02-05: qty 90, 90d supply, fill #0

## 2023-02-05 MED ORDER — ONDANSETRON HCL 4 MG PO TABS
4.0000 mg | ORAL_TABLET | Freq: Three times a day (TID) | ORAL | 5 refills | Status: DC | PRN
  Filled 2023-02-05: qty 20, 7d supply, fill #0

## 2023-02-05 NOTE — Progress Notes (Signed)
Established Patient Office Visit   Subjective  Patient ID: Connie Cobb, female    DOB: May 09, 1991  Age: 32 y.o. MRN: [id number]  Chief Complaint  Patient presents with   Annual Exam  Patient accompanied by her 64-year-old son.  Pt is a 32 year old female seen for CPE and follow-up on ongoing concerns.  Patient states she has been doing well overall since changing jobs.  Mostly working from home.  Noticed some difficulty with concentration despite taking Adderall 20 mg daily.  May get distracted with other task inquires about dose adjustment.  Medication too late in the day has difficulty sleeping.  Patient notes improvement in migraine since starting Maxalt for abortive therapy.  Taking hydroxyzine nightly as needed.    Past Medical History:  Diagnosis Date   Allergy    Anemia    GERD (gastroesophageal reflux disease)    Migraine    Past Surgical History:  Procedure Laterality Date   TONSILLECTOMY     Social History   Tobacco Use   Smoking status: Never   Smokeless tobacco: Never  Vaping Use   Vaping status: Never Used  Substance Use Topics   Alcohol use: No    Alcohol/week: 0.0 standard drinks of alcohol   Drug use: No   Family History  Problem Relation Age of Onset   Diabetes Father    Hyperlipidemia Father    Hypertension Father    Cancer Maternal Grandmother    Hypertension Maternal Grandmother    Diabetes Paternal Grandmother    Heart disease Paternal Grandmother    Cancer Paternal Grandfather    Diabetes Other        grandparent   Breast cancer Other        grandmother   Allergies  Allergen Reactions   Banana Anaphylaxis   Sulfonamide Derivatives Hives and Shortness Of Breath      ROS Negative unless stated above    Objective:     BP 124/80 (BP Location: Right Arm, Patient Position: Sitting, Cuff Size: Normal)   Pulse 70   Temp 98 F (36.7 C) (Oral)   Ht 5' 7.5" (1.715 m)   Wt 147 lb 3.2 oz (66.8 kg)   LMP 02/02/2023 (Exact Date)   SpO2  100%   BMI 22.71 kg/m  BP Readings from Last 3 Encounters:  02/05/23 124/80  09/15/22 124/79  04/20/22 106/68   Wt Readings from Last 3 Encounters:  02/05/23 147 lb 3.2 oz (66.8 kg)  10/09/22 155 lb (70.3 kg)  04/20/22 160 lb 12.8 oz (72.9 kg)      Physical Exam Constitutional:      Appearance: Normal appearance.  HENT:     Head: Normocephalic and atraumatic.     Right Ear: Tympanic membrane, ear canal and external ear normal.     Left Ear: Tympanic membrane, ear canal and external ear normal.     Nose: Nose normal.     Mouth/Throat:     Mouth: Mucous membranes are moist.     Pharynx: No oropharyngeal exudate or posterior oropharyngeal erythema.  Eyes:     General: No scleral icterus.    Extraocular Movements: Extraocular movements intact.     Conjunctiva/sclera: Conjunctivae normal.     Pupils: Pupils are equal, round, and reactive to light.  Neck:     Thyroid: No thyromegaly.  Cardiovascular:     Rate and Rhythm: Normal rate and regular rhythm.     Pulses: Normal pulses.     Heart sounds: Normal heart  sounds. No murmur heard.    No friction rub.  Pulmonary:     Effort: Pulmonary effort is normal.     Breath sounds: Normal breath sounds. No wheezing, rhonchi or rales.  Abdominal:     General: Bowel sounds are normal.     Palpations: Abdomen is soft.     Tenderness: There is no abdominal tenderness.  Musculoskeletal:        General: No deformity. Normal range of motion.  Lymphadenopathy:     Cervical: No cervical adenopathy.  Skin:    General: Skin is warm and dry.     Findings: No lesion.  Neurological:     General: No focal deficit present.     Mental Status: She is alert and oriented to person, place, and time.  Psychiatric:        Mood and Affect: Mood normal.        Thought Content: Thought content normal.      No results found for any visits on 02/05/23.    Assessment & Plan:  Well adult exam age -appropriate health screenings discussed.   Immunizations reviewed.  Will obtain labs.  Pap up-to-date done 01/14/2021, due 2027. -Next CPE in 1 year -     CBC with Differential/Platelet -     Comprehensive metabolic panel -     Lipid panel -     TSH -     Hemoglobin A1c  Attention deficit hyperactivity disorder (ADHD), predominantly inattentive type -Slight decrease in concentration noted since working from home. -Will increase dose of Adderall from 20 mg to 30 mg daily -PDMP reviewed and appropriate. -     Amphetamine-Dextroamphetamine; Take 1 tablet by mouth daily.  Dispense: 90 tablet; Refill: 0  Routine screening for STI (sexually transmitted infection) -     RPR -     HIV Antibody (routine testing w rflx) -     C. trachomatis/N. gonorrhoeae RNA  Insomnia, unspecified type -     hydrOXYzine HCl; Take 1 tablet (25 mg total) by mouth at bedtime as needed.  Dispense: 90 tablet; Refill: 3  Migraine without aura and without status migrainosus, not intractable -     Ondansetron HCl; Take 1 tablet (4 mg total) by mouth every 8 (eight) hours as needed for nausea or vomiting.  Dispense: 20 tablet; Refill: 5 -     Rizatriptan Benzoate; Take 1 tablet (5 mg total) by mouth as needed for migraine. May repeat in 2 hours if needed  Dispense: 10 tablet; Refill: 5    Return in about 3 months (around 05/08/2023) for adhd refills.  ok for virtual.   Deeann Saint, MD

## 2023-02-07 ENCOUNTER — Encounter: Payer: Self-pay | Admitting: Family Medicine

## 2023-03-06 ENCOUNTER — Other Ambulatory Visit (HOSPITAL_COMMUNITY): Payer: Self-pay

## 2023-03-06 ENCOUNTER — Telehealth: Payer: Commercial Managed Care - PPO | Admitting: Physician Assistant

## 2023-03-06 DIAGNOSIS — N760 Acute vaginitis: Secondary | ICD-10-CM

## 2023-03-06 DIAGNOSIS — B9689 Other specified bacterial agents as the cause of diseases classified elsewhere: Secondary | ICD-10-CM | POA: Diagnosis not present

## 2023-03-06 MED ORDER — METRONIDAZOLE 500 MG PO TABS
500.0000 mg | ORAL_TABLET | Freq: Two times a day (BID) | ORAL | 0 refills | Status: DC
  Filled 2023-03-06: qty 14, 7d supply, fill #0

## 2023-03-06 NOTE — Progress Notes (Signed)

## 2023-03-06 NOTE — Progress Notes (Signed)
I have spent 5 minutes in review of e-visit questionnaire, review and updating patient chart, medical decision making and response to patient.   William Cody Martin, PA-C    

## 2023-05-16 ENCOUNTER — Other Ambulatory Visit (HOSPITAL_COMMUNITY): Payer: Self-pay

## 2023-05-16 ENCOUNTER — Encounter: Payer: Self-pay | Admitting: Dermatology

## 2023-05-16 ENCOUNTER — Ambulatory Visit: Payer: Commercial Managed Care - PPO | Admitting: Dermatology

## 2023-05-16 DIAGNOSIS — L700 Acne vulgaris: Secondary | ICD-10-CM | POA: Diagnosis not present

## 2023-05-16 DIAGNOSIS — L810 Postinflammatory hyperpigmentation: Secondary | ICD-10-CM

## 2023-05-16 MED ORDER — CLINDAMYCIN PHOSPHATE 1 % EX SWAB
1.0000 | Freq: Every day | CUTANEOUS | 2 refills | Status: DC
  Filled 2023-05-16: qty 60, 60d supply, fill #0
  Filled 2023-07-11: qty 60, 60d supply, fill #1
  Filled 2023-09-10: qty 60, 60d supply, fill #2

## 2023-05-16 MED ORDER — TRETINOIN 0.025 % EX CREA
TOPICAL_CREAM | Freq: Every day | CUTANEOUS | 0 refills | Status: DC
  Filled 2023-05-16: qty 45, 30d supply, fill #0

## 2023-05-16 NOTE — Progress Notes (Signed)
   New Patient Visit   Subjective  Connie Cobb is a 32 y.o. female who presents for the following: New Pt & hyperpigmentation/acne  Patient states she has acne hyperpigmentation located at the chin that she would like to have examined. Patient reports the areas have been there off and on for 4 years. She noticed it starting around COVID and having to wear mask at work. She reports the areas are not bothersome.Patient rates irritation 0 out of 10. She states that the areas have not spread. Patient reports she has not previously been treated for these areas. Patient denies Hx of bx. Patient denies family history of skin cancer(s).  The patient has spots, moles and lesions to be evaluated, some may be new or changing and the patient may have concern these could be cancer.   The following portions of the chart were reviewed this encounter and updated as appropriate: medications, allergies, medical history  Review of Systems:  No other skin or systemic complaints except as noted in HPI or Assessment and Plan.  Objective  Well appearing patient in no apparent distress; mood and affect are within normal limits.   A focused examination was performed of the following areas:   Relevant exam findings are noted in the Assessment and Plan.    Assessment & Plan   ACNE VULGARIS Exam: Open comedones and inflammatory papules  Flared  Treatment Plan: - Educated pt on hormonal acne peaking in early 30's - Recommended using La Roche Posay mela b, facial cleanser and moisturizer. Samples provided. - Rx'd Clindamycin swabs to use every morning. - Rx'd Tretinoin to use M, W, F. Educated to use a pea sized amount.  - Follow up in 32mo    2. Post-inflammatory Hyperpigmentation - Assessment: Treatment includes Tretinoin 0.025% cream and La Roche-Posay Mela B3 pigment corrector serum. - Plan: Continue current treatment. Consider prescription lightening cream at three-month follow-up if  needed.     No follow-ups on file.    Documentation: I have reviewed the above documentation for accuracy and completeness, and I agree with the above.  I, Shirron Marcha Solders, CMA, am acting as scribe for [employer], DO.   Langston Reusing, DO

## 2023-05-16 NOTE — Patient Instructions (Addendum)
Hello Connie Cobb,  Thank you for visiting my office today. Your dedication to enhancing your skin health is greatly appreciated. Below is a summary of the essential instructions from our consultation regarding your skincare regimen:  - Morning Routine:   - Begin by washing your face with Panoxyl.   - Follow up with the application of Clindamycin swabs.   - Conclude with your current moisturizer, ensuring it is free of active ingredients.  - Evening Routine:   - Cleanse your face using La Roche-Posay Gentle Cleanser.   - Apply a small quantity of your moisturizer.   - On Monday, Wednesday, and Friday nights, use a pea-sized amount of Tretinoin 0.025% cream after moisturizing.  - General Instructions:   - Persist with the Tretinoin cream as instructed for acne prevention and anti-aging benefits.   - Anticipate gradual improvements every four weeks, aiming for a significant assessment in three months.   - Daily, apply La Roche-Posay moisturizer with sunscreen to your face, neck, and chest to avert further sun damage.   - Be vigilant for any adverse reactions such as stinging, burning, or irritation, and modify usage accordingly.  - Follow-Up:   - We will schedule a follow-up appointment in three months to review your progress and potentially refine your treatment plan.  Samples of La Roche-Posay face wash and moisturizer have been provided for your convenience. Should you have any inquiries or concerns regarding your treatment, please feel free to reach out to our office.  Warm regards,  Dr. Langston Reusing, Dermatologist     Important Information  Due to recent changes in healthcare laws, you may see results of your pathology and/or laboratory studies on MyChart before the doctors have had a chance to review them. We understand that in some cases there may be results that are confusing or concerning to you. Please understand that not all results are received at the same time and often the  doctors may need to interpret multiple results in order to provide you with the best plan of care or course of treatment. Therefore, we ask that you please give [place] 2 business days to thoroughly review all your results before contacting the office for clarification. Should we see a critical lab result, you will be contacted sooner.   If You Need Anything After Your Visit  If you have any questions or concerns for your doctor, please call our main line at [phone] If no one answers, please leave a voicemail as directed and we will return your call as soon as possible. Messages left after 4 pm will be answered the following business day.   You may also send [place] a message via MyChart. We typically respond to MyChart messages within 1-2 business days.  For prescription refills, please ask your pharmacy to contact our office. Our fax number is [phone].  If you have an urgent issue when the clinic is closed that cannot wait until the next business day, you can page your doctor at the number below.    Please note that while we do our best to be available for urgent issues outside of office hours, we are not available 24/7.   If you have an urgent issue and are unable to reach [place], you may choose to seek medical care at your doctor's office, retail clinic, urgent care center, or emergency room.  If you have a medical emergency, please immediately call 911 or go to the emergency department. In the event of inclement weather, please call our  main line at [phone] for an update on the status of any delays or closures.  Dermatology Medication Tips: Please keep the boxes that topical medications come in in order to help keep track of the instructions about where and how to use these. Pharmacies typically print the medication instructions only on the boxes and not directly on the medication tubes.   If your medication is too expensive, please contact our office at [phone] or send [place] a message  through MyChart.   We are unable to tell what your co-pay for medications will be in advance as this is different depending on your insurance coverage. However, we may be able to find a substitute medication at lower cost or fill out paperwork to get insurance to cover a needed medication.   If a prior authorization is required to get your medication covered by your insurance company, please allow [place] 1-2 business days to complete this process.  Drug prices often vary depending on where the prescription is filled and some pharmacies may offer cheaper prices.  The website www.goodrx.com contains coupons for medications through different pharmacies. The prices here do not account for what the cost may be with help from insurance (it may be cheaper with your insurance), but the website can give you the price if you did not use any insurance.  - You can print the associated coupon and take it with your prescription to the pharmacy.  - You may also stop by our office during regular business hours and pick up a GoodRx coupon card.  - If you need your prescription sent electronically to a different pharmacy, notify our office through [facility] or by phone at [phone]

## 2023-05-17 ENCOUNTER — Telehealth: Payer: Commercial Managed Care - PPO | Admitting: Family Medicine

## 2023-05-21 ENCOUNTER — Encounter: Payer: Self-pay | Admitting: Family Medicine

## 2023-05-21 ENCOUNTER — Other Ambulatory Visit (HOSPITAL_COMMUNITY): Payer: Self-pay

## 2023-05-21 ENCOUNTER — Telehealth (INDEPENDENT_AMBULATORY_CARE_PROVIDER_SITE_OTHER): Payer: Commercial Managed Care - PPO | Admitting: Family Medicine

## 2023-05-21 DIAGNOSIS — G4700 Insomnia, unspecified: Secondary | ICD-10-CM

## 2023-05-21 DIAGNOSIS — G43009 Migraine without aura, not intractable, without status migrainosus: Secondary | ICD-10-CM | POA: Diagnosis not present

## 2023-05-21 DIAGNOSIS — F900 Attention-deficit hyperactivity disorder, predominantly inattentive type: Secondary | ICD-10-CM

## 2023-05-21 DIAGNOSIS — N760 Acute vaginitis: Secondary | ICD-10-CM

## 2023-05-21 MED ORDER — HYDROXYZINE HCL 25 MG PO TABS
25.0000 mg | ORAL_TABLET | Freq: Every evening | ORAL | 3 refills | Status: DC | PRN
  Filled 2023-05-21: qty 90, 90d supply, fill #0
  Filled 2023-10-30: qty 90, 90d supply, fill #1

## 2023-05-21 MED ORDER — FLUCONAZOLE 150 MG PO TABS
ORAL_TABLET | ORAL | 0 refills | Status: DC
  Filled 2023-05-21: qty 2, 6d supply, fill #0

## 2023-05-21 MED ORDER — RIZATRIPTAN BENZOATE 5 MG PO TABS
5.0000 mg | ORAL_TABLET | ORAL | 5 refills | Status: DC | PRN
  Filled 2023-05-21: qty 10, 30d supply, fill #0
  Filled 2023-10-30: qty 10, 30d supply, fill #1
  Filled 2023-12-24: qty 10, 30d supply, fill #2

## 2023-05-21 MED ORDER — ONDANSETRON HCL 4 MG PO TABS
4.0000 mg | ORAL_TABLET | Freq: Three times a day (TID) | ORAL | 5 refills | Status: DC | PRN
  Filled 2023-05-21: qty 20, 7d supply, fill #0
  Filled 2023-10-30: qty 20, 7d supply, fill #1
  Filled 2023-12-24: qty 20, 7d supply, fill #2

## 2023-05-21 MED ORDER — AMPHETAMINE-DEXTROAMPHETAMINE 30 MG PO TABS
30.0000 mg | ORAL_TABLET | Freq: Every day | ORAL | 0 refills | Status: DC
  Filled 2023-05-21 (×2): qty 90, 90d supply, fill #0

## 2023-05-21 NOTE — Progress Notes (Signed)
Virtual Visit via Video Note  I connected with Connie Cobb on 05/21/23 at  2:30 PM EST by a video enabled telemedicine application and verified that I am speaking with the correct person using two identifiers.  Location patient: home Location provider:work or home office Persons participating in the virtual visit: patient, provider  I discussed the limitations of evaluation and management by telemedicine and the availability of in person appointments. The patient expressed understanding and agreed to proceed. Chief Complaint  Patient presents with   Medication Refill   Vaginitis    Patient has been have itchy, discharge, patient denies any odor, started 4 days ago,      HPI: Patient is a 32 year old female seen for acute concerns. Pt feeling like she is getting a yeast infection.  Endorses thick whitish d/c and discomfort x 4 days.  Was using a new over-the-counter feminine hygiene product which may have caused symptoms.  Requesting refill on Adderall.  Taking Adderall 30 mg daily   still feels active at 9 pm.  Trying to take medication earlier in the day.  If needed takes hydroxyzine for anxiety/to help with sleep.  Felt increase stress last wk, but improving this week.  Migraines come and go. Taking maxalt if needed and zofran.  ROS: See pertinent positives and negatives per HPI.  Past Medical History:  Diagnosis Date   Allergy    Anemia    GERD (gastroesophageal reflux disease)    Migraine     Past Surgical History:  Procedure Laterality Date   TONSILLECTOMY      Family History  Problem Relation Age of Onset   Diabetes Father    Hyperlipidemia Father    Hypertension Father    Cancer Maternal Grandmother    Hypertension Maternal Grandmother    Diabetes Paternal Grandmother    Heart disease Paternal Grandmother    Cancer Paternal Grandfather    Diabetes Other        grandparent   Breast cancer Other        grandmother     Current Outpatient Medications:     amphetamine-dextroamphetamine (ADDERALL) 30 MG tablet, Take 1 tablet by mouth daily., Disp: 90 tablet, Rfl: 0   cetirizine (ZYRTEC ALLERGY) 10 MG tablet, Take 1 tablet (10 mg total) by mouth daily., Disp: 100 tablet, Rfl: 0   clindamycin (CLEOCIN T) 1 % SWAB, Apply 1 Swab topically daily to wash face., Disp: 60 each, Rfl: 2   hydrOXYzine (ATARAX) 25 MG tablet, Take 1 tablet (25 mg total) by mouth at bedtime as needed., Disp: 90 tablet, Rfl: 3   ipratropium (ATROVENT) 0.03 % nasal spray, Place 2 sprays into both nostrils 2 (two) times daily., Disp: 30 mL, Rfl: 0   ondansetron (ZOFRAN) 4 MG tablet, Take 1 tablet (4 mg total) by mouth every 8 (eight) hours as needed for nausea or vomiting., Disp: 20 tablet, Rfl: 5   rizatriptan (MAXALT) 5 MG tablet, Take 1 tablet (5 mg total) by mouth as needed for migraine. May repeat in 2 hours if needed, Disp: 10 tablet, Rfl: 5   tretinoin (RETIN-A) 0.025 % cream, Apply topically at bedtime. Use every other day on M, W, F., Disp: 45 g, Rfl: 0  EXAM:  VITALS per patient if applicable: RR between 12-20 bpm  GENERAL: alert, oriented, appears well and in no acute distress  HEENT: atraumatic, conjunctiva clear, no obvious abnormalities on inspection of external nose and ears  NECK: normal movements of the head and neck  LUNGS:  on inspection no signs of respiratory distress, breathing rate appears normal, no obvious gross SOB, gasping or wheezing  CV: no obvious cyanosis  MS: moves all visible extremities without noticeable abnormality  PSYCH/NEURO: pleasant and cooperative, no obvious depression or anxiety, speech and thought processing grossly intact  ASSESSMENT AND PLAN:  Discussed the following assessment and plan:  Vaginitis and vulvovaginitis  -Likely 2/2 Candida -Consider discontinuing new feminine hygiene product is likely causing symptoms due to change in pH - Plan: fluconazole (DIFLUCAN) 150 MG tablet  Insomnia, unspecified type  -Sleep  hygiene -Hydroxyzine 25 mg nightly as needed - Plan: hydrOXYzine (ATARAX) 25 MG tablet  Migraine without aura and without status migrainosus, not intractable  - Plan: ondansetron (ZOFRAN) 4 MG tablet, rizatriptan (MAXALT) 5 MG tablet  Attention deficit hyperactivity disorder (ADHD), predominantly inattentive type  -PDMP reviewed -Stable -Continue Adderall 30 mg daily as needed -Plan: amphetamine-dextroamphetamine (ADDERALL) 30 MG tablet  Given strict precautions.  Follow-up in clinic for continued or worsening symptoms.   I discussed the assessment and treatment plan with the patient. The patient was provided an opportunity to ask questions and all were answered. The patient agreed with the plan and demonstrated an understanding of the instructions.   The patient was advised to call back or seek an in-person evaluation if the symptoms worsen or if the condition fails to improve as anticipated.   Deeann Saint, MD

## 2023-05-21 NOTE — Progress Notes (Signed)
"  Patient was unable to self-report due to a lack of equipment at home via telehealth"

## 2023-06-14 ENCOUNTER — Ambulatory Visit: Payer: Commercial Managed Care - PPO | Admitting: Family Medicine

## 2023-06-14 ENCOUNTER — Other Ambulatory Visit (HOSPITAL_COMMUNITY): Payer: Self-pay

## 2023-06-14 ENCOUNTER — Other Ambulatory Visit (HOSPITAL_COMMUNITY)
Admission: RE | Admit: 2023-06-14 | Discharge: 2023-06-14 | Disposition: A | Discharge status: Home / Self Care | Payer: Commercial Managed Care - PPO | Source: Ambulatory Visit | Attending: Family Medicine | Admitting: Family Medicine

## 2023-06-14 VITALS — BP 98/68 | HR 117 | Temp 98.7°F | Ht 67.5 in | Wt 139.2 lb

## 2023-06-14 DIAGNOSIS — R232 Flushing: Secondary | ICD-10-CM | POA: Diagnosis not present

## 2023-06-14 DIAGNOSIS — N939 Abnormal uterine and vaginal bleeding, unspecified: Secondary | ICD-10-CM | POA: Insufficient documentation

## 2023-06-14 DIAGNOSIS — N898 Other specified noninflammatory disorders of vagina: Secondary | ICD-10-CM | POA: Insufficient documentation

## 2023-06-14 DIAGNOSIS — K649 Unspecified hemorrhoids: Secondary | ICD-10-CM | POA: Diagnosis not present

## 2023-06-14 MED ORDER — HYDROCORTISONE ACETATE 25 MG RE SUPP
25.0000 mg | Freq: Two times a day (BID) | RECTAL | 0 refills | Status: DC
  Filled 2023-06-14: qty 12, 6d supply, fill #0

## 2023-06-14 NOTE — Progress Notes (Signed)
Established Patient Office Visit   Subjective  Patient ID: Connie Cobb, female    DOB: [birth year]  Age: 32 y.o. MRN: [id number]  Chief Complaint  Patient presents with   Vaginal Discharge    Patient came in today for vaginal discharge that started 2 weeks ago, patient states that the discharge is different, [place] and creamy     Pt is 32 yo female seen for acute concern.  Pt notes vaginal d/c x 2 wks.  Feels like it has a slight odor, but no.  No irritation noted.  D/c ranges from clear to creamy [place].  Pt denies changes in soaps lotions, detergents, or partners.  Patient mentions history of hemorrhoids.  Tried OTC meds.  Having an occasional hot flash.  Notes increased sweating at night.  Notes menses recently shorter in duration occurred in 3 weeks instead of 4.   Patient Active Problem List   Diagnosis Date Noted   Attention deficit hyperactivity disorder (ADHD), predominantly inattentive type 10/09/2022   Ringing in right ear 06/15/2014   Inattention 01/23/2013   Migraine headache 01/23/2013   Chlamydia 12/22/2011   GERD (gastroesophageal reflux disease) 10/29/2011   Sinusitis 08/21/2011   Allergic rhinitis, seasonal 08/21/2011   Lower urinary tract infectious disease 08/21/2011   Screening for malignant neoplasm of cervix 05/22/2011   Encounter for routine gynecological examination 05/22/2011   Family history of breast cancer 05/22/2011   Contact with or exposure to other viral diseases(V01.79) 12/01/2010   Anxiety 12/01/2010   Dermatophytosis 08/01/2010   Vaginitis and vulvovaginitis 08/01/2010   ANEMIA-NOS 05/19/2010   Past Medical History:  Diagnosis Date   Allergy    Anemia    GERD (gastroesophageal reflux disease)    Migraine    Past Surgical History:  Procedure Laterality Date   TONSILLECTOMY     Social History   Tobacco Use   Smoking status: Never   Smokeless tobacco: Never  Vaping Use   Vaping status: Never Used  Substance Use Topics   Alcohol use:  No    Alcohol/week: 0.0 standard drinks of alcohol   Drug use: No   Family History  Problem Relation Age of Onset   Diabetes Father    Hyperlipidemia Father    Hypertension Father    Cancer Maternal Grandmother    Hypertension Maternal Grandmother    Diabetes Paternal Grandmother    Heart disease Paternal Grandmother    Cancer Paternal Grandfather    Diabetes Other        grandparent   Breast cancer Other        grandmother   Allergies  Allergen Reactions   Banana Anaphylaxis   Sulfonamide Derivatives Hives and Shortness Of Breath      ROS Negative unless stated above    Objective:     BP 98/68 (BP Location: Right Arm, Patient Position: Sitting, Cuff Size: Normal)   Pulse (!) 117   Temp 98.7 F (37.1 C) (Oral)   Ht 5' 7.5" (1.715 m)   Wt 139 lb 3.2 oz (63.1 kg)   LMP 05/26/2023 (Exact Date)   SpO2 99%   BMI 21.48 kg/m  BP Readings from Last 3 Encounters:  06/14/23 98/68  02/05/23 124/80  09/15/22 124/79   Wt Readings from Last 3 Encounters:  06/14/23 139 lb 3.2 oz (63.1 kg)  02/05/23 147 lb 3.2 oz (66.8 kg)  10/09/22 155 lb (70.3 kg)      Physical Exam Constitutional:      General: She is not  in acute distress.    Appearance: Normal appearance.  HENT:     Head: Normocephalic and atraumatic.     Nose: Nose normal.     Mouth/Throat:     Mouth: Mucous membranes are moist.  Cardiovascular:     Rate and Rhythm: Normal rate and regular rhythm.     Heart sounds: Normal heart sounds. No murmur heard.    No gallop.  Pulmonary:     Effort: Pulmonary effort is normal. No respiratory distress.     Breath sounds: Normal breath sounds. No wheezing, rhonchi or rales.  Genitourinary:    Comments: Aptima self swab collected. Skin:    General: Skin is warm and dry.  Neurological:     Mental Status: She is alert and oriented to person, place, and time.    Results for orders placed or performed in visit on 06/14/23  POCT urine pregnancy  Result Value Ref  Range   Preg Test, Ur Negative Negative  Cervicovaginal ancillary only  Result Value Ref Range   Neisseria Gonorrhea Negative    Chlamydia Negative    Trichomonas Negative    Bacterial Vaginitis (gardnerella) Positive (A)    Candida Vaginitis Negative    Candida Glabrata Negative    Comment Normal Reference Range Candida Species - Negative    Comment Normal Reference Range Candida Galbrata - Negative    Comment Normal Reference Range Trichomonas - Negative    Comment Normal Reference Ranger Chlamydia - Negative    Comment      Normal Reference Range Neisseria Gonorrhea - Negative   Comment      Normal Reference Range Bacterial Vaginosis - Negative      Assessment & Plan:  Abnormal uterine bleeding (AUB) -     Cervicovaginal ancillary only -     CBC with Differential/Platelet; Future -     Iron, TIBC and Ferritin Panel; Future -     POCT urine pregnancy  Vaginal discharge -     Cervicovaginal ancillary only   ~Aptima swab positive for BV.  Rx for Flagyl sent to pharmacy.  Hot flashes  -     CBC with Differential/Platelet; Future -     Iron, TIBC and Ferritin Panel; Future  Hemorrhoids, unspecified hemorrhoid type -     Hydrocortisone Acetate; Place 1 suppository (25 mg total) rectally 2 (two) times daily.  Dispense: 12 suppository; Refill: 0   Return if symptoms worsen or fail to improve.   Deeann Saint, MD

## 2023-06-15 ENCOUNTER — Other Ambulatory Visit (HOSPITAL_COMMUNITY)
Admission: RE | Admit: 2023-06-15 | Discharge: 2023-06-15 | Disposition: A | Discharge status: Home / Self Care | Payer: Commercial Managed Care - PPO | Source: Ambulatory Visit | Attending: Family Medicine | Admitting: Family Medicine

## 2023-06-15 ENCOUNTER — Other Ambulatory Visit (HOSPITAL_COMMUNITY): Payer: Self-pay

## 2023-06-15 DIAGNOSIS — R232 Flushing: Secondary | ICD-10-CM | POA: Insufficient documentation

## 2023-06-15 DIAGNOSIS — N939 Abnormal uterine and vaginal bleeding, unspecified: Secondary | ICD-10-CM | POA: Diagnosis not present

## 2023-06-15 LAB — CBC WITH DIFFERENTIAL/PLATELET
Abs Immature Granulocytes: 0.01 10*3/uL (ref 0.00–0.07)
Basophils Absolute: 0 10*3/uL (ref 0.0–0.1)
Basophils Relative: 1 %
Eosinophils Absolute: 0.1 10*3/uL (ref 0.0–0.5)
Eosinophils Relative: 1 %
HCT: 40.7 % (ref 36.0–46.0)
Hemoglobin: 13.4 g/dL (ref 12.0–15.0)
Immature Granulocytes: 0 %
Lymphocytes Relative: 46 %
Lymphs Abs: 2.3 10*3/uL (ref 0.7–4.0)
MCH: 27.6 pg (ref 26.0–34.0)
MCHC: 32.9 g/dL (ref 30.0–36.0)
MCV: 83.9 fL (ref 80.0–100.0)
Monocytes Absolute: 0.3 10*3/uL (ref 0.1–1.0)
Monocytes Relative: 6 %
Neutro Abs: 2.4 10*3/uL (ref 1.7–7.7)
Neutrophils Relative %: 46 %
Platelets: 344 10*3/uL (ref 150–400)
RBC: 4.85 MIL/uL (ref 3.87–5.11)
RDW: 12.5 % (ref 11.5–15.5)
WBC: 5.1 10*3/uL (ref 4.0–10.5)
nRBC: 0 % (ref 0.0–0.2)

## 2023-06-15 LAB — POCT URINE PREGNANCY: Preg Test, Ur: NEGATIVE

## 2023-06-15 LAB — T4, FREE: Free T4: 1.1 ng/dL (ref 0.61–1.12)

## 2023-06-15 LAB — TSH: TSH: 0.695 u[IU]/mL (ref 0.350–4.500)

## 2023-06-15 MED ORDER — HYDROCORTISONE ACETATE 25 MG RE SUPP
25.0000 mg | Freq: Two times a day (BID) | RECTAL | 0 refills | Status: AC
  Filled 2023-06-15: qty 12, 6d supply, fill #0

## 2023-06-16 LAB — LUTEINIZING HORMONE: LH: 7.1 m[IU]/mL

## 2023-06-16 LAB — FOLLICLE STIMULATING HORMONE: FSH: 3.1 m[IU]/mL

## 2023-06-18 ENCOUNTER — Other Ambulatory Visit: Payer: Self-pay | Admitting: Family Medicine

## 2023-06-18 DIAGNOSIS — N760 Acute vaginitis: Secondary | ICD-10-CM

## 2023-06-18 LAB — ESTROGENS, TOTAL: Estrogen: 320 pg/mL

## 2023-06-18 LAB — CERVICOVAGINAL ANCILLARY ONLY
Bacterial Vaginitis (gardnerella): POSITIVE — AB
Candida Glabrata: NEGATIVE
Candida Vaginitis: NEGATIVE
Chlamydia: NEGATIVE
Comment: NEGATIVE
Comment: NEGATIVE
Comment: NEGATIVE
Comment: NEGATIVE
Comment: NEGATIVE
Comment: NORMAL
Neisseria Gonorrhea: NEGATIVE
Trichomonas: NEGATIVE

## 2023-06-18 MED ORDER — METRONIDAZOLE 500 MG PO TABS: 500.0000 mg | ORAL_TABLET | Freq: Two times a day (BID) | ORAL | 0 refills | Status: AC

## 2023-07-30 ENCOUNTER — Telehealth: Payer: Commercial Managed Care - PPO | Admitting: Emergency Medicine

## 2023-07-30 ENCOUNTER — Other Ambulatory Visit (HOSPITAL_COMMUNITY): Payer: Self-pay

## 2023-07-30 DIAGNOSIS — N760 Acute vaginitis: Secondary | ICD-10-CM

## 2023-07-30 DIAGNOSIS — B9689 Other specified bacterial agents as the cause of diseases classified elsewhere: Secondary | ICD-10-CM

## 2023-07-30 MED ORDER — METRONIDAZOLE 500 MG PO TABS
500.0000 mg | ORAL_TABLET | Freq: Two times a day (BID) | ORAL | 0 refills | Status: AC
Start: 2023-07-30 — End: 2023-08-07
  Filled 2023-07-30: qty 14, 7d supply, fill #0

## 2023-07-30 NOTE — Progress Notes (Signed)

## 2023-08-16 ENCOUNTER — Encounter: Payer: Self-pay | Admitting: Dermatology

## 2023-08-16 ENCOUNTER — Other Ambulatory Visit (HOSPITAL_COMMUNITY): Payer: Self-pay

## 2023-08-16 ENCOUNTER — Ambulatory Visit: Payer: Commercial Managed Care - PPO | Admitting: Dermatology

## 2023-08-16 DIAGNOSIS — L810 Postinflammatory hyperpigmentation: Secondary | ICD-10-CM

## 2023-08-16 DIAGNOSIS — L700 Acne vulgaris: Secondary | ICD-10-CM | POA: Diagnosis not present

## 2023-08-16 DIAGNOSIS — Z79899 Other long term (current) drug therapy: Secondary | ICD-10-CM | POA: Diagnosis not present

## 2023-08-16 MED ORDER — SPIRONOLACTONE 50 MG PO TABS
50.0000 mg | ORAL_TABLET | Freq: Every day | ORAL | 5 refills | Status: DC
  Filled 2023-08-16: qty 30, 30d supply, fill #0
  Filled 2023-09-10: qty 30, 30d supply, fill #1
  Filled 2023-10-30: qty 30, 30d supply, fill #2
  Filled 2024-01-08: qty 30, 30d supply, fill #3

## 2023-08-16 NOTE — Patient Instructions (Signed)
Hello Connie Cobb,  Thank you for visiting my office today.   Below is a summary of the essential instructions we covered during our consultation:  Increase Tretinoin  0.025% to 5 nights a week (Mon-Fri):   Application: Apply nightly. In case of dryness or irritation, consider reducing usage temporarily.   Combination with Moisturizer: Can be combined with a heavy moisturizer, either by applying it on top or mixing together.  Continue Clindamycin:   Routine: Apply every morning as part of your skincare routine.  Start Spironolactone 50 mg:   Dosage: Take orally before bed to assist in controlling hormonal acne.   Monitoring: Watch for signs of lightheadedness or muscle cramps and ensure you are drinking enough water. Dosage adjustments may be made based on your response.  Sunscreen Recommendations:   Daily Use: Utilize the [place]ntree sunscreen with a blue lid.   For High Sun Exposure: On your [employer] and beach activities, use the [employer] , which combines mineral and chemical filters, suitable for direct sunlight. Remember to reapply every three hours or midday during high sun exposure.  Follow-Up Appointment:   Schedule: Scheduled for August. We will evaluate the effectiveness of your current regimen and discuss the possibility of increasing the strength of Tretinoin and Spironolactone if necessary.  Please do not hesitate to reach out if you have any questions or concerns before our next meeting. Enjoy your cruise and take good care!  Warm regards,  Dr. Langston Reusing, Dermatology     Recommended Sunscreen     Important Information  Due to recent changes in healthcare laws, you may see results of your pathology and/or laboratory studies on MyChart before the doctors have had a chance to review them. We understand that in some cases there may be results that are confusing or concerning to you. Please understand that not all results are received at the same time and  often the doctors may need to interpret multiple results in order to provide you with the best plan of care or course of treatment. Therefore, we ask that you please give [place] 2 business days to thoroughly review all your results before contacting the office for clarification. Should we see a critical lab result, you will be contacted sooner.   If You Need Anything After Your Visit  If you have any questions or concerns for your doctor, please call our main line at [phone] If no one answers, please leave a voicemail as directed and we will return your call as soon as possible. Messages left after 4 pm will be answered the following business day.   You may also send [place] a message via MyChart. We typically respond to MyChart messages within 1-2 business days.  For prescription refills, please ask your pharmacy to contact our office. Our fax number is [phone].  If you have an urgent issue when the clinic is closed that cannot wait until the next business day, you can page your doctor at the number below.    Please note that while we do our best to be available for urgent issues outside of office hours, we are not available 24/7.   If you have an urgent issue and are unable to reach [place], you may choose to seek medical care at your doctor's office, retail clinic, urgent care center, or emergency room.  If you have a medical emergency, please immediately call 911 or go to the emergency department. In the event of inclement weather, please call our main line at [phone]  for an update on the status of any delays or closures.  Dermatology Medication Tips: Please keep the boxes that topical medications come in in order to help keep track of the instructions about where and how to use these. Pharmacies typically print the medication instructions only on the boxes and not directly on the medication tubes.   If your medication is too expensive, please contact our office at [phone] or send [place]  a message through MyChart.   We are unable to tell what your co-pay for medications will be in advance as this is different depending on your insurance coverage. However, we may be able to find a substitute medication at lower cost or fill out paperwork to get insurance to cover a needed medication.   If a prior authorization is required to get your medication covered by your insurance company, please allow [place] 1-2 business days to complete this process.  Drug prices often vary depending on where the prescription is filled and some pharmacies may offer cheaper prices.  The website www.goodrx.com contains coupons for medications through different pharmacies. The prices here do not account for what the cost may be with help from insurance (it may be cheaper with your insurance), but the website can give you the price if you did not use any insurance.  - You can print the associated coupon and take it with your prescription to the pharmacy.  - You may also stop by our office during regular business hours and pick up a GoodRx coupon card.  - If you need your prescription sent electronically to a different pharmacy, notify our office through [facility] or by phone at [phone]

## 2023-08-16 NOTE — Progress Notes (Signed)
Follow-Up Visit   Subjective  Connie Cobb is a 33 y.o. female who presents for the following: Acne Vulgaris. 3 month follow up. Last appointment 05/16/2023. Noticed some improvement but is still there. Using Clindamycin swabs every morning, Tretinoin 0.025% cream M, W, F. Does have some dryness.  Recent lab work by PCP showed hormone levels WNL. Would like to discuss additional treatment options.   La Roche-Posay Mela B3 pigment corrector serum causes stinging.   The following portions of the chart were reviewed this encounter and updated as appropriate: medications, allergies, medical history  Review of Systems:  No other skin or systemic complaints except as noted in HPI or Assessment and Plan.  Objective  Well appearing patient in no apparent distress; mood and affect are within normal limits.  Areas Examined: Face, chest and back  Relevant exam findings are noted in the Assessment and Plan.            Assessment & Plan    ACNE VULGARIS Exam: cystic papules at jaw line and chin  Chronic and persistent condition with duration or expected duration over one year. Condition is improving with treatment but not currently at goal.   Treatment Plan:  Increase Tretinoin 0.025% cream to 5 nights per week at bedtime, wash off in morning.  Moisturize after applying Tretinoin.  Continue Clindamycin swabs every morning.  Start Spironolactone 50 mg 1 tablet at bedtime.   CeraVe Sheer Moisture sunscreen. Samples given.   Spironolactone can cause increased urination and cause blood pressure to decrease. Please watch for signs of lightheadedness and be cautious when changing position. It can sometimes cause breast tenderness or an irregular period in premenopausal women. It can also increase potassium. The increase in potassium usually is not a concern unless you are taking other medicines that also increase potassium, so please be sure your doctor knows all of the other  medications you are taking. This medication should not be taken by pregnant women.  This medicine should also not be taken together with sulfa drugs like Bactrim (trimethoprim/sulfamethexazole).   Topical retinoid medications like tretinoin/Retin-A, adapalene/Differin, tazarotene/Fabior, and Epiduo/Epiduo Forte can cause dryness and irritation when first started. Only apply a pea-sized amount to the entire affected area. Avoid applying it around the eyes, edges of mouth and creases at the nose. If you experience irritation, use a good moisturizer first and/or apply the medicine less often. If you are doing well with the medicine, you can increase how often you use it until you are applying every night. Be careful with sun protection while using this medication as it can make you sensitive to the sun. This medicine should not be used by pregnant women.   POST-INFLAMMATORY HYPERPIGMENTATION (PIH) Exam: hyperpigmented macules and/or patches at face   This is a benign condition that comes from having previous inflammation in the skin and will fade with time over months to sometimes years. Recommend daily sun protection including sunscreen SPF 30+ to sun-exposed areas. - Recommend treating any itchy or red areas on the skin quickly to prevent new areas of PIH. Treating with prescription medicines such as hydroquinone may help fade dark spots faster.    Treatment Plan:  Recommend wearing sunscreen daily.  Avoid using La Roche-Posay Mela B3 pigment corrector serum.    Return in about 6 months (around 02/13/2024) for Acne Follow Up.  I, Lawson Radar, CMA, am acting as scribe for Langston Reusing, DO.   Documentation: I have reviewed the above documentation for accuracy and  completeness, and I agree with the above.  Langston Reusing, DO

## 2023-08-21 ENCOUNTER — Telehealth: Payer: Commercial Managed Care - PPO | Admitting: Physician Assistant

## 2023-08-21 DIAGNOSIS — B9689 Other specified bacterial agents as the cause of diseases classified elsewhere: Secondary | ICD-10-CM | POA: Diagnosis not present

## 2023-08-21 DIAGNOSIS — J019 Acute sinusitis, unspecified: Secondary | ICD-10-CM

## 2023-08-21 MED ORDER — AMOXICILLIN-POT CLAVULANATE 875-125 MG PO TABS: 1.0000 | ORAL_TABLET | Freq: Two times a day (BID) | ORAL | 0 refills | Status: DC

## 2023-08-21 NOTE — Progress Notes (Signed)
 I have spent 5 minutes in review of e-visit questionnaire, review and updating patient chart, medical decision making and response to patient.   Piedad Climes, PA-C

## 2023-08-21 NOTE — Progress Notes (Signed)

## 2023-08-22 ENCOUNTER — Other Ambulatory Visit (HOSPITAL_COMMUNITY): Payer: Self-pay

## 2023-08-22 MED ORDER — AMOXICILLIN-POT CLAVULANATE 875-125 MG PO TABS
1.0000 | ORAL_TABLET | Freq: Two times a day (BID) | ORAL | 0 refills | Status: DC
  Filled 2023-08-22 (×2): qty 14, 7d supply, fill #0

## 2023-08-22 NOTE — Addendum Note (Signed)
 Addended by: Waldon Merl on: 08/22/2023 07:14 AM   Modules accepted: Orders

## 2023-09-17 ENCOUNTER — Encounter: Payer: Self-pay | Admitting: Family Medicine

## 2023-09-17 ENCOUNTER — Other Ambulatory Visit (HOSPITAL_COMMUNITY)
Admission: RE | Admit: 2023-09-17 | Discharge: 2023-09-17 | Disposition: A | Discharge status: Home / Self Care | Source: Ambulatory Visit | Attending: Family Medicine | Admitting: Family Medicine

## 2023-09-17 ENCOUNTER — Telehealth: Payer: Self-pay

## 2023-09-17 ENCOUNTER — Ambulatory Visit: Admitting: Family Medicine

## 2023-09-17 VITALS — BP 128/74 | HR 116 | Temp 97.6°F | Ht 67.5 in | Wt 147.6 lb

## 2023-09-17 DIAGNOSIS — N760 Acute vaginitis: Secondary | ICD-10-CM | POA: Insufficient documentation

## 2023-09-17 MED ORDER — FLUCONAZOLE 150 MG PO TABS: ORAL_TABLET | ORAL | 1 refills | Status: DC

## 2023-09-17 NOTE — Telephone Encounter (Signed)
 Patient is sch to see Dr. Salomon Fick 09/17/23

## 2023-09-17 NOTE — Progress Notes (Signed)
 Established Patient Office Visit   Subjective  Patient ID: Connie Cobb, female    DOB: Sep 22, 1990  Age: 33 y.o. MRN: [id number]  Chief Complaint  Patient presents with   Vaginitis    Started a week ago itchy, discharge, odor     Pt is a 33 yo female seen for acute concern.  Patient endorses vaginal irritation and discharge x 1 week.  Concern for yeast infection.  Denies changes in soaps, lotions, detergents, activity.  Some improvement in symptoms this morning.    Patient Active Problem List   Diagnosis Date Noted   Attention deficit hyperactivity disorder (ADHD), predominantly inattentive type 10/09/2022   Ringing in right ear 06/15/2014   Inattention 01/23/2013   Migraine headache 01/23/2013   Chlamydia 12/22/2011   GERD (gastroesophageal reflux disease) 10/29/2011   Sinusitis 08/21/2011   Allergic rhinitis, seasonal 08/21/2011   Lower urinary tract infectious disease 08/21/2011   Screening for malignant neoplasm of cervix 05/22/2011   Encounter for routine gynecological examination 05/22/2011   Family history of breast cancer 05/22/2011   Contact with or exposure to other viral diseases(V01.79) 12/01/2010   Anxiety 12/01/2010   Dermatophytosis 08/01/2010   Vaginitis and vulvovaginitis 08/01/2010   ANEMIA-NOS 05/19/2010   Past Medical History:  Diagnosis Date   Allergy    Anemia    GERD (gastroesophageal reflux disease)    Migraine    Past Surgical History:  Procedure Laterality Date   TONSILLECTOMY     Social History   Tobacco Use   Smoking status: Never   Smokeless tobacco: Never  Vaping Use   Vaping status: Never Used  Substance Use Topics   Alcohol use: No    Alcohol/week: 0.0 standard drinks of alcohol   Drug use: No   Family History  Problem Relation Age of Onset   Diabetes Father    Hyperlipidemia Father    Hypertension Father    Cancer Maternal Grandmother    Hypertension Maternal Grandmother    Diabetes Paternal Grandmother    Heart  disease Paternal Grandmother    Cancer Paternal Grandfather    Diabetes Other        grandparent   Breast cancer Other        grandmother   Allergies  Allergen Reactions   Banana Anaphylaxis   Sulfonamide Derivatives Hives and Shortness Of Breath      ROS Negative unless stated above    Objective:     BP 128/74 (BP Location: Left Arm, Cuff Size: Normal)   Pulse (!) 116   Temp 97.6 F (36.4 C) (Oral)   Ht 5' 7.5" (1.715 m)   Wt 147 lb 9.6 oz (67 kg)   SpO2 99%   BMI 22.78 kg/m  BP Readings from Last 3 Encounters:  09/17/23 128/74  06/14/23 98/68  02/05/23 124/80   Wt Readings from Last 3 Encounters:  09/17/23 147 lb 9.6 oz (67 kg)  06/14/23 139 lb 3.2 oz (63.1 kg)  02/05/23 147 lb 3.2 oz (66.8 kg)      Physical Exam Constitutional:      Appearance: Normal appearance.  HENT:     Head: Normocephalic and atraumatic.     Mouth/Throat:     Mouth: Mucous membranes are moist.  Cardiovascular:     Rate and Rhythm: Normal rate.  Pulmonary:     Effort: Pulmonary effort is normal.  Genitourinary:    Comments: Aptima self swab collected. Skin:    General: Skin is warm and dry.  Neurological:  Mental Status: She is alert and oriented to person, place, and time.     No results found for any visits on 09/17/23.    Assessment & Plan:  Acute vaginitis -     Cervicovaginal ancillary only -     Fluconazole; Take 1 tab now.  Repeat dose in 3 days if needed.  Dispense: 2 tablet; Refill: 1  Advised to monitor changes in pH and cause symptoms.  Consider OTC boric acid capsules vaginally for recurrent BV symptoms.  Further recommendations if needed based on results.    Return if symptoms worsen or fail to improve.   Deeann Saint, MD

## 2023-09-17 NOTE — Telephone Encounter (Signed)
 Matter addressed at [facility] this afternoon.

## 2023-09-18 ENCOUNTER — Other Ambulatory Visit: Payer: Self-pay | Admitting: Dermatology

## 2023-09-18 DIAGNOSIS — L700 Acne vulgaris: Secondary | ICD-10-CM

## 2023-09-19 ENCOUNTER — Other Ambulatory Visit (HOSPITAL_COMMUNITY): Payer: Self-pay

## 2023-09-19 LAB — CERVICOVAGINAL ANCILLARY ONLY
Bacterial Vaginitis (gardnerella): NEGATIVE
Candida Glabrata: NEGATIVE
Candida Vaginitis: NEGATIVE
Comment: NEGATIVE
Comment: NEGATIVE
Comment: NEGATIVE
Comment: NEGATIVE
Trichomonas: NEGATIVE

## 2023-09-19 MED ORDER — TRETINOIN 0.025 % EX CREA
TOPICAL_CREAM | Freq: Every day | CUTANEOUS | 0 refills | Status: DC
  Filled 2023-09-19: qty 45, 90d supply, fill #0

## 2023-09-21 ENCOUNTER — Ambulatory Visit: Payer: Self-pay | Admitting: Plastic Surgery

## 2023-09-21 ENCOUNTER — Encounter: Payer: Self-pay | Admitting: Plastic Surgery

## 2023-09-21 DIAGNOSIS — Z719 Counseling, unspecified: Secondary | ICD-10-CM

## 2023-09-21 DIAGNOSIS — Q178 Other specified congenital malformations of ear: Secondary | ICD-10-CM

## 2023-09-21 NOTE — Progress Notes (Signed)
 Referring Provider Deeann Saint, MD [address] [place],  [place] [address]   CC: No chief complaint on file.     Connie Cobb is an 33 y.o. female.  HPI: Connie Cobb is a very pleasant 33 year old female who presents today with a torn left earlobe.  Patient underwent repair of the earlobe in October 2022 the ear was repierced in December.  She states that she was wearing an ear ring and at some point in time the earring pulled through and was lost and also left a new defect in the ear.  She is interested in having it repaired.  Allergies  Allergen Reactions   Banana Anaphylaxis   Sulfonamide Derivatives Hives and Shortness Of Breath    Outpatient Encounter Medications as of 09/21/2023  Medication Sig   amoxicillin-clavulanate (AUGMENTIN) 875-125 MG tablet Take 1 tablet by mouth 2 (two) times daily.   amphetamine-dextroamphetamine (ADDERALL) 30 MG tablet Take 1 tablet by mouth daily.   cetirizine (ZYRTEC ALLERGY) 10 MG tablet Take 1 tablet (10 mg total) by mouth daily.   clindamycin (CLEOCIN T) 1 % SWAB Apply 1 Swab topically daily to wash face.   fluconazole (DIFLUCAN) 150 MG tablet Take 1 tab now.  Repeat dose in 3 days if needed.   hydrocortisone (ANUSOL-HC) 25 MG suppository Place 1 suppository (25 mg total) rectally 2 (two) times daily.   hydrOXYzine (ATARAX) 25 MG tablet Take 1 tablet (25 mg total) by mouth at bedtime as needed.   ipratropium (ATROVENT) 0.03 % nasal spray Place 2 sprays into both nostrils 2 (two) times daily.   ondansetron (ZOFRAN) 4 MG tablet Take 1 tablet (4 mg total) by mouth every 8 (eight) hours as needed for nausea or vomiting.   rizatriptan (MAXALT) 5 MG tablet Take 1 tablet (5 mg total) by mouth as needed for migraine. May repeat in 2 hours if needed   spironolactone (ALDACTONE) 50 MG tablet Take 1 tablet (50 mg total) by mouth at bedtime.   tretinoin (RETIN-A) 0.025 % cream Apply topically at bedtime. Use every other day on Monday, Wednesday,  and Friday.   No facility-administered encounter medications on file as of 09/21/2023.     Past Medical History:  Diagnosis Date   Allergy    Anemia    GERD (gastroesophageal reflux disease)    Migraine     Past Surgical History:  Procedure Laterality Date   TONSILLECTOMY      Family History  Problem Relation Age of Onset   Diabetes Father    Hyperlipidemia Father    Hypertension Father    Cancer Maternal Grandmother    Hypertension Maternal Grandmother    Diabetes Paternal Grandmother    Heart disease Paternal Grandmother    Cancer Paternal Grandfather    Diabetes Other        grandparent   Breast cancer Other        grandmother    Social History   Social History Narrative   Not on file     Review of Systems General: Denies fevers, chills, weight loss CV: Denies chest pain, shortness of breath, palpitations Left ear: A 5 mm well epithelialized defect in the left earlobe  Physical Exam    09/17/2023    5:00 PM 06/14/2023    4:48 PM 02/05/2023   11:31 AM  Vitals with BMI  Height 5' 7.5" 5' 7.5" 5' 7.5"  Weight 147 lbs 10 oz 139 lbs 3 oz 147 lbs 3 oz  BMI 22.76  21.47 22.7  Systolic 128 98 124  Diastolic 74 68 80  Pulse 116 117 70    General:  No acute distress,  Alert and oriented, Non-Toxic, Normal speech and affect Left ear: As noted the patient has a very small 5 mm fully epithelialized defect in the left ear. Mammogram: Not applicable Assessment/Plan Acquired defect, left earlobe: Patient has a well epithelialized defect in the left earlobe.  She would like to have this repaired.  We discussed the repair and the postoperative course.  The sutures will be removed 5 to 7 days after surgery and I would prefer she did not have the ear repierced for 6 months and preferably not in the same place.  She is ask if this can be done today and it can.  Procedure Note  Preoperative Dx: Acquired defect left ear  Postoperative Dx: Same  Procedure: Repair acquired  defect  Anesthesia: Lidocaine 1% with 1:100,000 epinephrine and 0.25% Sensorcaine   Indication for Procedure: Repair of torn left earlobe  Description of Procedure: Risks and complications were explained to the patient including the possibility of recurrence.  Consent was confirmed and the patient understands the risks and benefits.  The potential complications and alternatives were explained and the patient consents.  The patient expressed understanding the option of not having the procedure and the risks of a scar.  Time out was called and all information was confirmed to be correct.    The area was prepped and drapped.  Local anesthetic was injected in the subcutaneous tissues.  After waiting for the local to take affect the epithelialized edges of the defect were excised sharply.  After obtaining hemostasis, the surgical wound was closed with a 5-0 Monocryl suture in the subcutaneous tissues and interrupted 5-0 Prolene sutures and the skin.  The surgical wound measured approximately 5 mm.  A dressing was applied.  The patient was given instructions on how to care for the area and a follow up appointment.  [place] tolerated the procedure well and there were no complications.   Santiago Glad 09/21/2023, 2:13 PM

## 2023-09-24 ENCOUNTER — Encounter: Payer: Self-pay | Admitting: Family Medicine

## 2023-09-27 ENCOUNTER — Ambulatory Visit: Admitting: Physician Assistant

## 2023-09-27 DIAGNOSIS — Q178 Other specified congenital malformations of ear: Secondary | ICD-10-CM

## 2023-09-27 NOTE — Progress Notes (Signed)
 Patient is a pleasant 33 year old female s/p repair of torn left earlobe performed 09/21/2023 by Dr. Ladona Ridgel who presents to clinic for postprocedural follow-up.  Reviewed procedural report and the excision site was closed with 5-0 Monocryl in the subcutaneous tissue and interrupted 5-0 Prolene for the skin.  Today, patient is doing well. No specific concerns or complaints. Reports that she had previously had torn earlobe repair, but then re-pierced prematurely.   On exam, skin edges appear well approximated. Small, superficial incisional wound on posterior aspect of earlobe. However, reasonable to proceed with removal of the interrupted Prolene sutures.  Sutures removed without complication or difficulty, well tolerated by patient. No dehiscence.  Remains well approximated.  Recommending Vaseline regularly for the next week. May proceed with silicone scar gel afterwards if desired.  Advised against using her clip on earrings for at least another 1-2 weeks.  Re-pierce should not occur before 6 months and in her case, given repeat tearing, perhaps 12 months.  Follow up as needed.

## 2023-10-11 ENCOUNTER — Institutional Professional Consult (permissible substitution): Admitting: Plastic Surgery

## 2023-10-30 ENCOUNTER — Other Ambulatory Visit: Payer: Self-pay | Admitting: Dermatology

## 2023-10-30 DIAGNOSIS — L700 Acne vulgaris: Secondary | ICD-10-CM

## 2023-10-31 ENCOUNTER — Other Ambulatory Visit: Payer: Self-pay

## 2023-10-31 ENCOUNTER — Other Ambulatory Visit (HOSPITAL_COMMUNITY): Payer: Self-pay

## 2023-10-31 MED ORDER — CLINDAMYCIN PHOSPHATE 1 % EX SWAB
1.0000 | Freq: Every day | CUTANEOUS | 2 refills | Status: DC
  Filled 2023-10-31: qty 60, 60d supply, fill #0
  Filled 2023-12-24: qty 60, 60d supply, fill #1

## 2023-11-07 ENCOUNTER — Encounter: Payer: Self-pay | Admitting: Family Medicine

## 2023-11-07 ENCOUNTER — Ambulatory Visit: Admitting: Family Medicine

## 2023-11-07 ENCOUNTER — Other Ambulatory Visit (HOSPITAL_COMMUNITY): Payer: Self-pay

## 2023-11-07 DIAGNOSIS — N923 Ovulation bleeding: Secondary | ICD-10-CM | POA: Diagnosis not present

## 2023-11-07 LAB — CBC WITH DIFFERENTIAL/PLATELET
Basophils Absolute: 0 10*3/uL (ref 0.0–0.1)
Basophils Relative: 0.5 % (ref 0.0–3.0)
Eosinophils Absolute: 0 10*3/uL (ref 0.0–0.7)
Eosinophils Relative: 0.7 % (ref 0.0–5.0)
HCT: 35.9 % — ABNORMAL LOW (ref 36.0–46.0)
Hemoglobin: 11.9 g/dL — ABNORMAL LOW (ref 12.0–15.0)
Lymphocytes Relative: 26.6 % (ref 12.0–46.0)
Lymphs Abs: 1.5 10*3/uL (ref 0.7–4.0)
MCHC: 33.2 g/dL (ref 30.0–36.0)
MCV: 85.1 fl (ref 78.0–100.0)
Monocytes Absolute: 0.4 10*3/uL (ref 0.1–1.0)
Monocytes Relative: 7.7 % (ref 3.0–12.0)
Neutro Abs: 3.7 10*3/uL (ref 1.4–7.7)
Neutrophils Relative %: 64.5 % (ref 43.0–77.0)
Platelets: 272 10*3/uL (ref 150.0–400.0)
RBC: 4.22 Mil/uL (ref 3.87–5.11)
RDW: 14.1 % (ref 11.5–15.5)
WBC: 5.8 10*3/uL (ref 4.0–10.5)

## 2023-11-07 LAB — POCT URINE PREGNANCY: Preg Test, Ur: NEGATIVE

## 2023-11-07 LAB — T4, FREE: Free T4: 0.7 ng/dL (ref 0.60–1.60)

## 2023-11-07 LAB — TSH: TSH: 0.67 u[IU]/mL (ref 0.35–5.50)

## 2023-11-07 MED ORDER — MEDROXYPROGESTERONE ACETATE 10 MG PO TABS
10.0000 mg | ORAL_TABLET | Freq: Three times a day (TID) | ORAL | 0 refills | Status: DC
Start: 2023-11-07 — End: 2024-01-10
  Filled 2023-11-07: qty 15, 5d supply, fill #0

## 2023-11-07 NOTE — Progress Notes (Signed)
 Established Patient Office Visit   Subjective  Patient ID: Connie Cobb, female    DOB: [birth year]  Age: 33 y.o. MRN: [id number]  Chief Complaint  Patient presents with   Nausea    Nausea and spotting    Pt is a 33 yo female seen for acute concern.  Pt endorses irregular bleeding.  Menses was 4/23-4/27 with spotting on the 28th, 29th, 5/1-5/4.  On 5/6 and today bleeding like a period.  Pt endorses unprotected sex with one partner.  Notes recent increase in frequency of relations.  Not currently on contraception due to causing migraines.  No changes in prescription meds.  Tried lemme pur probiotic gummies and goli matcha mind supplement.  Denies dizziness, fatigue, pelvic pain, constipation.    Patient Active Problem List   Diagnosis Date Noted   Attention deficit hyperactivity disorder (ADHD), predominantly inattentive type 10/09/2022   Ringing in right ear 06/15/2014   Inattention 01/23/2013   Migraine headache 01/23/2013   Chlamydia 12/22/2011   GERD (gastroesophageal reflux disease) 10/29/2011   Sinusitis 08/21/2011   Allergic rhinitis, seasonal 08/21/2011   Lower urinary tract infectious disease 08/21/2011   Screening for malignant neoplasm of cervix 05/22/2011   Encounter for routine gynecological examination 05/22/2011   Family history of breast cancer 05/22/2011   Contact with or exposure to other viral diseases(V01.79) 12/01/2010   Anxiety 12/01/2010   Dermatophytosis 08/01/2010   Vaginitis and vulvovaginitis 08/01/2010   ANEMIA-NOS 05/19/2010   Past Medical History:  Diagnosis Date   Allergy    Anemia    GERD (gastroesophageal reflux disease)    Migraine    Past Surgical History:  Procedure Laterality Date   TONSILLECTOMY     Social History   Tobacco Use   Smoking status: Never   Smokeless tobacco: Never  Vaping Use   Vaping status: Never Used  Substance Use Topics   Alcohol use: No    Alcohol/week: 0.0 standard drinks of alcohol   Drug use: No    Family History  Problem Relation Age of Onset   Diabetes Father    Hyperlipidemia Father    Hypertension Father    Cancer Maternal Grandmother    Hypertension Maternal Grandmother    Diabetes Paternal Grandmother    Heart disease Paternal Grandmother    Cancer Paternal Grandfather    Diabetes Other        grandparent   Breast cancer Other        grandmother   Allergies  Allergen Reactions   Banana Anaphylaxis   Sulfonamide Derivatives Hives and Shortness Of Breath      ROS Negative unless stated above    Objective:     BP 120/70 (BP Location: Left Arm, Patient Position: Sitting, Cuff Size: Normal)   Pulse 90   Temp 98.2 F (36.8 C) (Oral)   Ht 5' 7.5" (1.715 m)   Wt 151 lb (68.5 kg)   LMP 10/24/2023   SpO2 98%   BMI 23.30 kg/m  BP Readings from Last 3 Encounters:  11/07/23 120/70  09/17/23 128/74  06/14/23 98/68   Wt Readings from Last 3 Encounters:  11/07/23 151 lb (68.5 kg)  09/17/23 147 lb 9.6 oz (67 kg)  06/14/23 139 lb 3.2 oz (63.1 kg)      Physical Exam Constitutional:      Appearance: Normal appearance.  HENT:     Head: Normocephalic and atraumatic.     Nose: Nose normal.     Mouth/Throat:     Mouth:  Mucous membranes are moist.  Cardiovascular:     Rate and Rhythm: Normal rate.     Heart sounds: Normal heart sounds.  Pulmonary:     Effort: Pulmonary effort is normal.  Abdominal:     General: Bowel sounds are normal.     Palpations: Abdomen is soft.  Skin:    General: Skin is warm and dry.  Neurological:     Mental Status: She is alert and oriented to person, place, and time.     No results found for any visits on 11/07/23.    Assessment & Plan:  Intermenstrual bleeding -     POCT urine pregnancy -     CBC with Differential/Platelet -     HIV Antibody (routine testing w rflx) -     RPR -     TSH -     T4, free -     C. trachomatis/N. gonorrhoeae RNA -     Pregnancy, urine -     medroxyPROGESTERone  Acetate; Take 1 tablet  (10 mg total) by mouth 3 (three) times daily for 5 days.  Dispense: 15 tablet; Refill: 0  Bleeding between menses.  Discussed possible causes including pregnancy, SAB, threatened or incomplete SAB, polyp, endometrial thickening, fibroids, infection, vigorous activity, etc.  POC hCG negative.  Obtain labs and STI testing.  Discussed pelvic ultrasound.  Patient wishes to wait until labs result.  Pelvic rest encouraged.  Provera  prn for continued heavy bleeding.  Given strict precautions  Return if symptoms worsen or fail to improve.   Viola Greulich, MD

## 2023-11-09 LAB — C. TRACHOMATIS/N. GONORRHOEAE RNA
C. trachomatis RNA, TMA: NOT DETECTED
N. gonorrhoeae RNA, TMA: NOT DETECTED

## 2023-11-09 LAB — RPR TITER: RPR Titer: 1:2 {titer} — ABNORMAL HIGH

## 2023-11-09 LAB — T PALLIDUM AB: T Pallidum Abs: NEGATIVE

## 2023-11-09 LAB — PREGNANCY, URINE: Preg Test, Ur: NEGATIVE

## 2023-11-09 LAB — RPR: RPR Ser Ql: REACTIVE — AB

## 2023-11-09 LAB — HIV ANTIBODY (ROUTINE TESTING W REFLEX): HIV 1&2 Ab, 4th Generation: NONREACTIVE

## 2023-11-12 ENCOUNTER — Other Ambulatory Visit (HOSPITAL_COMMUNITY): Payer: Self-pay

## 2023-11-12 ENCOUNTER — Encounter: Payer: Self-pay | Admitting: Family Medicine

## 2023-11-14 ENCOUNTER — Other Ambulatory Visit: Payer: Self-pay | Admitting: Family Medicine

## 2023-11-14 ENCOUNTER — Telehealth: Payer: Self-pay | Admitting: *Deleted

## 2023-11-14 ENCOUNTER — Encounter: Payer: Self-pay | Admitting: Family Medicine

## 2023-11-14 DIAGNOSIS — N923 Ovulation bleeding: Secondary | ICD-10-CM

## 2023-11-14 NOTE — Telephone Encounter (Signed)
 Copied from CRM [id number]. Topic: General - Other >> Nov 14, 2023 11:19 AM Juluis Ok wrote: Reason for CRM: Steffan Edison with the health department is wanting to know if patient will receive a confirmatory syphilis test. Request a callback at [phone]

## 2023-11-14 NOTE — Telephone Encounter (Signed)
 Provider contacted Connie Cobb at [facility].  Pt's confirmatory testing (Treponemal abs) were negative.  Results are a false positive.

## 2023-11-15 ENCOUNTER — Ambulatory Visit
Admission: RE | Admit: 2023-11-15 | Discharge: 2023-11-15 | Disposition: A | Discharge status: Home / Self Care | Source: Ambulatory Visit | Attending: Family Medicine | Admitting: Family Medicine

## 2023-11-15 DIAGNOSIS — N923 Ovulation bleeding: Secondary | ICD-10-CM

## 2023-11-15 DIAGNOSIS — N921 Excessive and frequent menstruation with irregular cycle: Secondary | ICD-10-CM | POA: Diagnosis not present

## 2023-11-16 ENCOUNTER — Ambulatory Visit: Payer: Self-pay | Admitting: Family Medicine

## 2023-12-24 ENCOUNTER — Other Ambulatory Visit: Payer: Self-pay | Admitting: Family Medicine

## 2023-12-24 ENCOUNTER — Other Ambulatory Visit: Payer: Self-pay | Admitting: Dermatology

## 2023-12-24 DIAGNOSIS — L700 Acne vulgaris: Secondary | ICD-10-CM

## 2023-12-24 DIAGNOSIS — F900 Attention-deficit hyperactivity disorder, predominantly inattentive type: Secondary | ICD-10-CM

## 2023-12-25 ENCOUNTER — Other Ambulatory Visit (HOSPITAL_COMMUNITY): Payer: Self-pay

## 2023-12-25 ENCOUNTER — Other Ambulatory Visit: Payer: Self-pay

## 2023-12-25 MED ORDER — TRETINOIN 0.025 % EX CREA
1.0000 | TOPICAL_CREAM | Freq: Every day | CUTANEOUS | 0 refills | Status: DC
  Filled 2023-12-25 (×2): qty 45, 30d supply, fill #0

## 2023-12-26 ENCOUNTER — Other Ambulatory Visit (HOSPITAL_COMMUNITY): Payer: Self-pay

## 2023-12-26 MED ORDER — AMPHETAMINE-DEXTROAMPHETAMINE 30 MG PO TABS
30.0000 mg | ORAL_TABLET | Freq: Every day | ORAL | 0 refills | Status: DC
  Filled 2023-12-26: qty 90, 90d supply, fill #0

## 2024-01-10 ENCOUNTER — Encounter: Payer: Self-pay | Admitting: Obstetrics and Gynecology

## 2024-01-10 ENCOUNTER — Ambulatory Visit: Admitting: Obstetrics and Gynecology

## 2024-01-10 VITALS — BP 102/64 | HR 88 | Temp 97.9°F | Ht 68.75 in | Wt 154.0 lb

## 2024-01-10 DIAGNOSIS — Z01419 Encounter for gynecological examination (general) (routine) without abnormal findings: Secondary | ICD-10-CM | POA: Diagnosis not present

## 2024-01-10 DIAGNOSIS — Z1331 Encounter for screening for depression: Secondary | ICD-10-CM | POA: Diagnosis not present

## 2024-01-10 NOTE — Patient Instructions (Addendum)
 Start daily prenatal. Stop tretinoin  while trying to conceive.  Health Maintenance, Female Adopting a healthy lifestyle and getting preventive care are important in promoting health and wellness. Ask your health care provider about: The right schedule for you to have regular tests and exams. Things you can do on your own to prevent diseases and keep yourself healthy. What should I know about diet, weight, and exercise? Eat a healthy diet  Eat a diet that includes plenty of vegetables, fruits, low-fat dairy products, and lean protein. Do not eat a lot of foods that are high in solid fats, added sugars, or sodium. Maintain a healthy weight Body mass index (BMI) is used to identify weight problems. It estimates body fat based on height and weight. Your health care provider can help determine your BMI and help you achieve or maintain a healthy weight. Get regular exercise Get regular exercise. This is one of the most important things you can do for your health. Most adults should: Exercise for at least 150 minutes each week. The exercise should increase your heart rate and make you sweat (moderate-intensity exercise). Do strengthening exercises at least twice a week. This is in addition to the moderate-intensity exercise. Spend less time sitting. Even light physical activity can be beneficial. Watch cholesterol and blood lipids Have your blood tested for lipids and cholesterol at 34 years of age, then have this test every 5 years. Have your cholesterol levels checked more often if: Your lipid or cholesterol levels are high. You are older than 34 years of age. You are at high risk for heart disease. What should I know about cancer screening? Depending on your health history and family history, you may need to have cancer screening at various ages. This may include screening for: Breast cancer. Cervical cancer. Colorectal cancer. Skin cancer. Lung cancer. What should I know about heart  disease, diabetes, and high blood pressure? Blood pressure and heart disease High blood pressure causes heart disease and increases the risk of stroke. This is more likely to develop in people who have high blood pressure readings or are overweight. Have your blood pressure checked: Every 3-5 years if you are 68-50 years of age. Every year if you are 69 years old or older. Diabetes Have regular diabetes screenings. This checks your fasting blood sugar level. Have the screening done: Once every three years after age 68 if you are at a normal weight and have a low risk for diabetes. More often and at a younger age if you are overweight or have a high risk for diabetes. What should I know about preventing infection? Hepatitis B If you have a higher risk for hepatitis B, you should be screened for this virus. Talk with your health care provider to find out if you are at risk for hepatitis B infection. Hepatitis C Testing is recommended for: Everyone born from [birth year] through 1966. Anyone with known risk factors for hepatitis C. Sexually transmitted infections (STIs) Get screened for STIs, including gonorrhea and chlamydia, if: You are sexually active and are younger than 34 years of age. You are older than 34 years of age and your health care provider tells you that you are at risk for this type of infection. Your sexual activity has changed since you were last screened, and you are at increased risk for chlamydia or gonorrhea. Ask your health care provider if you are at risk. Ask your health care provider about whether you are at high risk for HIV. Your health care  provider may recommend a prescription medicine to help prevent HIV infection. If you choose to take medicine to prevent HIV, you should first get tested for HIV. You should then be tested every 3 months for as long as you are taking the medicine. Pregnancy If you are about to stop having your period (premenopausal) and you may become  pregnant, seek counseling before you get pregnant. Take 400 to 800 micrograms (mcg) of folic acid every day if you become pregnant. Ask for birth control (contraception) if you want to prevent pregnancy. Osteoporosis and menopause Osteoporosis is a disease in which the bones lose minerals and strength with aging. This can result in bone fractures. If you are 3 years old or older, or if you are at risk for osteoporosis and fractures, ask your health care provider if you should: Be screened for bone loss. Take a calcium or vitamin D  supplement to lower your risk of fractures. Be given hormone replacement therapy (HRT) to treat symptoms of menopause. Follow these instructions at home: Alcohol use Do not drink alcohol if: Your health care provider tells you not to drink. You are pregnant, may be pregnant, or are planning to become pregnant. If you drink alcohol: Limit how much you have to: 0-1 drink a day. Know how much alcohol is in your drink. In the [place] one drink equals one 12 oz bottle of beer (355 mL), one 5 oz glass of wine (148 mL), or one 1 oz glass of hard liquor (44 mL). Lifestyle Do not use any products that contain nicotine or tobacco. These products include cigarettes, chewing tobacco, and vaping devices, such as e-cigarettes. If you need help quitting, ask your health care provider. Do not use street drugs. Do not share needles. Ask your health care provider for help if you need support or information about quitting drugs. General instructions Schedule regular health, dental, and eye exams. Stay current with your vaccines. Tell your health care provider if: You often feel depressed. You have ever been abused or do not feel safe at home. Summary Adopting a healthy lifestyle and getting preventive care are important in promoting health and wellness. Follow your health care provider's instructions about healthy diet, exercising, and getting tested or screened for  diseases. Follow your health care provider's instructions on monitoring your cholesterol and blood pressure. This information is not intended to replace advice given to you by your health care provider. Make sure you discuss any questions you have with your health care provider. Document Revised: 11/08/2020 Document Reviewed: 11/08/2020 Elsevier Patient Education  2024 [employer].

## 2024-01-10 NOTE — Progress Notes (Signed)
 33 y.o. G8P1011 female here for annual exam. Single. RN, Designer, television/film set at [employer].  Patient's last menstrual period was 12/23/2023 (approximate). Period Duration (Days): 3-4 Period Pattern: (!) Irregular Menstrual Flow: Moderate Menstrual Control: Maxi pad, Tampon Dysmenorrhea: (!) Moderate  She reports concerns with fertility. Cycle q21d. Had a short cycle in May. TVUS ordered by PCP normal. Partner x3 years, now having unprotected intercourse for ~4 months. Partner is 38yo with no kids. Dec 2024 [facility] estradiol  TSH wnl STD testing completed with PCP May 2025 No difficulty conceiving with son 8y ago. Hx of TAB prior to son. No hx of ectopic.  On spironolactone  for acne.  Abnormal bleeding: as noted above Pelvic discharge or pain: none Breast mass, nipple discharge or skin changes : none  Sexually active: Yes  Birth control: None Last PAP:     Component Value Date/Time   DIAGPAP  01/10/2021 1141    - Negative for intraepithelial lesion or malignancy (NILM)   DIAGPAP  03/01/2018 0000    NEGATIVE FOR INTRAEPITHELIAL LESIONS OR MALIGNANCY. BENIGN REACTIVE/REPARATIVE CHANGES.   DIAGPAP (A) 02/28/2017 0000    ATYPICAL SQUAMOUS CELLS OF UNDETERMINED SIGNIFICANCE (ASC-US ).   HPVHIGH Negative 01/10/2021 1141   ADEQPAP  01/10/2021 1141    Satisfactory for evaluation; transformation zone component PRESENT.   ADEQPAP  03/01/2018 0000    Satisfactory for evaluation  endocervical/transformation zone component PRESENT.   ADEQPAP (A) 02/28/2017 0000    Satisfactory for evaluation  endocervical/transformation zone component PRESENT.   Gardasil: complete  Exercising: Yes, cardio and strength training, walking 3-4 days a week Smoker: No  Flowsheet Row Office Visit from [facility]10/2024 in [facility]  PHQ-2 Total Score 0    Flowsheet Row Video Visit from 10/09/2022 in [facility] [place]  PHQ-9 Total Score 0    GYN HISTORY: Chlamydia,  2013   OB History  Gravida Para Term Preterm AB Living  2 1 1  1 1   SAB IAB Ectopic Multiple Live Births     0 1    # Outcome Date GA Lbr Len/2nd Weight Sex Type Anes PTL Lv  2 Term 01/27/16 [redacted]w[redacted]d 19:09 / 00:30 8 lb 4.5 oz (3.755 kg) M Vag-Spont None  LIV     Birth Comments: Skin tear on scalp  1 AB 06/02/08           Past Medical History:  Diagnosis Date   Allergy    Anemia    GERD (gastroesophageal reflux disease)    Migraine    Past Surgical History:  Procedure Laterality Date   TONSILLECTOMY     Current Outpatient Medications on File Prior to Visit  Medication Sig Dispense Refill   amphetamine -dextroamphetamine  (ADDERALL) 30 MG tablet Take 1 tablet by mouth daily. 90 tablet 0   cetirizine  (ZYRTEC  ALLERGY) 10 MG tablet Take 1 tablet (10 mg total) by mouth daily. 100 tablet 0   clindamycin  (CLEOCIN  T) 1 % SWAB Apply 1 Swab topically daily to wash face. 60 each 2   hydrocortisone  (ANUSOL -HC) 25 MG suppository Place 1 suppository (25 mg total) rectally 2 (two) times daily. 12 suppository 0   hydrOXYzine  (ATARAX ) 25 MG tablet Take 1 tablet (25 mg total) by mouth at bedtime as needed. 90 tablet 3   ipratropium (ATROVENT ) 0.03 % nasal spray Place 2 sprays into both nostrils 2 (two) times daily. 30 mL 0   ondansetron  (ZOFRAN ) 4 MG tablet Take 1 tablet (4 mg total) by mouth  every 8 (eight) hours as needed for nausea or vomiting. 20 tablet 5   rizatriptan  (MAXALT ) 5 MG tablet Take 1 tablet (5 mg total) by mouth as needed for migraine. May repeat in 2 hours if needed 10 tablet 5   spironolactone  (ALDACTONE ) 50 MG tablet Take 1 tablet (50 mg total) by mouth at bedtime. 30 tablet 5   tretinoin  (RETIN-A ) 0.025 % cream Apply 1 Application topically at bedtime. Use every other day on Monday, Wednesday, and Friday. 45 g 0   No current facility-administered medications on file prior to visit.   Social History   Socioeconomic History   Marital status: Single    Spouse name: Not on file    Number of children: 1   Years of education: Not on file   Highest education level: Master's degree (e.g., MA, MS, MEng, MEd, MSW, MBA)  Occupational History   Not on file  Tobacco Use   Smoking status: Never   Smokeless tobacco: Never  Vaping Use   Vaping status: Never Used  Substance and Sexual Activity   Alcohol use: No    Alcohol/week: 0.0 standard drinks of alcohol   Drug use: No   Sexual activity: Yes    Partners: Male    Birth control/protection: None  Other Topics Concern   Not on file  Social History Narrative   Not on file   Social Drivers of Health   Financial Resource Strain: Patient Declined (06/14/2023)   Overall Financial Resource Strain (CARDIA)    Difficulty of Paying Living Expenses: Patient declined  Food Insecurity: Patient Declined (06/14/2023)   Hunger Vital Sign    Worried About Running Out of Food in the Last Year: Patient declined    Ran Out of Food in the Last Year: Patient declined  Transportation Needs: No Transportation Needs (06/14/2023)   PRAPARE - Administrator, Civil Service (Medical): No    Lack of Transportation (Non-Medical): No  Physical Activity: Insufficiently Active (06/14/2023)   Exercise Vital Sign    Days of Exercise per Week: 4 days    Minutes of Exercise per Session: 30 min  Stress: No Stress Concern Present (06/14/2023)   [employer] of Occupational Health - Occupational Stress Questionnaire    Feeling of Stress : Only a little  Social Connections: Moderately Integrated (06/14/2023)   Social Connection and Isolation Panel    Frequency of Communication with Friends and Family: More than three times a week    Frequency of Social Gatherings with Friends and Family: Twice a week    Attends Religious Services: More than 4 times per year    Active Member of [employer] or Organizations: Yes    Attends Engineer, structural: More than 4 times per year    Marital Status: Never married  Intimate Partner  Violence: Not At Risk (02/05/2023)   Humiliation, Afraid, Rape, and Kick questionnaire    Fear of Current or Ex-Partner: No    Emotionally Abused: No    Physically Abused: No    Sexually Abused: No   Family History  Problem Relation Age of Onset   Diabetes Father    Hyperlipidemia Father    Hypertension Father    Cancer Maternal Grandmother    Hypertension Maternal Grandmother    Diabetes Paternal Grandmother    Heart disease Paternal Grandmother    Cancer Paternal Grandfather    Diabetes Other        grandparent   Breast cancer Other  grandmother   Allergies  Allergen Reactions   Banana Anaphylaxis   Sulfonamide Derivatives Hives and Shortness Of Breath    PE Today's Vitals   01/10/24 0927  BP: 102/64  Pulse: 88  Temp: 97.9 F (36.6 C)  TempSrc: Oral  SpO2: 98%  Weight: 154 lb (69.9 kg)  Height: 5' 8.75 (1.746 m)   Body mass index is 22.91 kg/m.  Physical Exam Vitals reviewed. Exam conducted with a chaperone present.  Constitutional:      General: She is not in acute distress.    Appearance: Normal appearance.  HENT:     Head: Normocephalic and atraumatic.     Nose: Nose normal.  Eyes:     Extraocular Movements: Extraocular movements intact.     Conjunctiva/sclera: Conjunctivae normal.  Neck:     Thyroid : No thyroid  mass, thyromegaly or thyroid  tenderness.  Pulmonary:     Effort: Pulmonary effort is normal.  Chest:     Chest wall: No mass or tenderness.  Breasts:    Right: Normal. No swelling, mass, nipple discharge, skin change or tenderness.     Left: Normal. No swelling, mass, nipple discharge, skin change or tenderness.  Abdominal:     General: There is no distension.     Palpations: Abdomen is soft.     Tenderness: There is no abdominal tenderness.  Genitourinary:    General: Normal vulva.     Exam position: Lithotomy position.     Urethra: No prolapse.     Vagina: Normal. No vaginal discharge or bleeding.     Cervix: Normal. No  lesion.     Uterus: Normal. Not enlarged and not tender.      Adnexa: Right adnexa normal and left adnexa normal.  Musculoskeletal:        General: Normal range of motion.     Cervical back: Normal range of motion.  Lymphadenopathy:     Upper Body:     Right upper body: No axillary adenopathy.     Left upper body: No axillary adenopathy.     Lower Body: No right inguinal adenopathy. No left inguinal adenopathy.  Skin:    General: Skin is warm and dry.  Neurological:     General: No focal deficit present.     Mental Status: She is alert.  Psychiatric:        Mood and Affect: Mood normal.        Behavior: Behavior normal.      Assessment and Plan:        Encounter for gynecological examination without abnormal finding Assessment & Plan: Cervical cancer screening performed according to ASCCP guidelines.  Labs and immunizations with her primary Encouraged safe sexual practices as indicated.  Desires pregnancy- Recommend PNV and timed intercourse over 12 months. Encouraged healthy lifestyle practices with diet and exercise For patients under 50yo, I recommend 1000mg  calcium daily and 600IU of vitamin D  daily.    Negative depression screening   Vera LULLA Pa, MD

## 2024-01-10 NOTE — Assessment & Plan Note (Addendum)
 Cervical cancer screening performed according to ASCCP guidelines.  Labs and immunizations with her primary Encouraged safe sexual practices as indicated.  Desires pregnancy- Recommend PNV and timed intercourse over 12 months. Encouraged healthy lifestyle practices with diet and exercise For patients under 34yo, I recommend 1000mg  calcium daily and 600IU of vitamin D  daily.

## 2024-01-23 ENCOUNTER — Other Ambulatory Visit: Payer: Self-pay

## 2024-01-23 ENCOUNTER — Ambulatory Visit: Admitting: Obstetrics and Gynecology

## 2024-01-23 ENCOUNTER — Encounter: Payer: Self-pay | Admitting: Obstetrics and Gynecology

## 2024-01-23 ENCOUNTER — Other Ambulatory Visit (HOSPITAL_COMMUNITY): Payer: Self-pay

## 2024-01-23 VITALS — BP 118/64 | HR 79 | Temp 98.0°F | Wt 159.0 lb

## 2024-01-23 DIAGNOSIS — R112 Nausea with vomiting, unspecified: Secondary | ICD-10-CM

## 2024-01-23 DIAGNOSIS — Z3200 Encounter for pregnancy test, result unknown: Secondary | ICD-10-CM | POA: Diagnosis not present

## 2024-01-23 DIAGNOSIS — Z3201 Encounter for pregnancy test, result positive: Secondary | ICD-10-CM

## 2024-01-23 DIAGNOSIS — N911 Secondary amenorrhea: Secondary | ICD-10-CM

## 2024-01-23 DIAGNOSIS — O219 Vomiting of pregnancy, unspecified: Secondary | ICD-10-CM

## 2024-01-23 LAB — PREGNANCY, URINE: Preg Test, Ur: POSITIVE — AB

## 2024-01-23 MED ORDER — DOXYLAMINE-PYRIDOXINE 10-10 MG PO TBEC
1.0000 | DELAYED_RELEASE_TABLET | Freq: Three times a day (TID) | ORAL | 2 refills | Status: AC | PRN
  Filled 2024-01-23: qty 60, 20d supply, fill #0

## 2024-01-23 NOTE — Progress Notes (Signed)
 33 y.o. G67P1011 female here for pregnancy test. 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 RN, Designer, television/film set at [employer].  Patient's last menstrual period was 12/23/2023 (approximate).   She took home pregnancy test and results were positive. She started prenatal vitamins on Sunday. Concerns with medication for motion sickness for upcoming travel. Very fatigue, and heartburn.   Smoker: No  GYN HISTORY: Chlamydia, 2013   OB History  Gravida Para Term Preterm AB Living  2 1 1  1 1   SAB IAB Ectopic Multiple Live Births     0 1    # Outcome Date GA Lbr Len/2nd Weight Sex Type Anes PTL Lv  2 Term 01/27/16 [redacted]w[redacted]d 19:09 / 00:30 8 lb 4.5 oz (3.755 kg) M Vag-Spont None  LIV     Birth Comments: Skin tear on scalp  1 AB 06/02/08           Past Medical History:  Diagnosis Date   Allergy    Anemia    GERD (gastroesophageal reflux disease)    Migraine    Past Surgical History:  Procedure Laterality Date   TONSILLECTOMY     Current Outpatient Medications on File Prior to Visit  Medication Sig Dispense Refill   amphetamine -dextroamphetamine  (ADDERALL) 30 MG tablet Take 1 tablet by mouth daily. 90 tablet 0   cetirizine  (ZYRTEC  ALLERGY) 10 MG tablet Take 1 tablet (10 mg total) by mouth daily. 100 tablet 0   clindamycin  (CLEOCIN  T) 1 % SWAB Apply 1 Swab topically daily to wash face. 60 each 2   hydrocortisone  (ANUSOL -HC) 25 MG suppository Place 1 suppository (25 mg total) rectally 2 (two) times daily. 12 suppository 0   hydrOXYzine  (ATARAX ) 25 MG tablet Take 1 tablet (25 mg total) by mouth at bedtime as needed. 90 tablet 3   ipratropium (ATROVENT ) 0.03 % nasal spray Place 2 sprays into both nostrils 2 (two) times daily. 30 mL 0   Prenatal Vit-Fe Fumarate-FA (PRENATAL VITAMIN PO) Take by mouth.     No current facility-administered medications on file prior to visit.    Allergies  Allergen Reactions   Banana Anaphylaxis   Sulfonamide Derivatives Hives and Shortness Of Breath    PE Today's Vitals   01/23/24 1205   BP: 118/64  Pulse: 79  Temp: 98 F (36.7 C)  TempSrc: Oral  SpO2: 98%  Weight: 159 lb (72.1 kg)    Body mass index is 23.65 kg/m.  Physical Exam Vitals reviewed.  Constitutional:      General: She is not in acute distress.    Appearance: Normal appearance.  HENT:     Head: Normocephalic and atraumatic.     Nose: Nose normal.  Eyes:     Extraocular Movements: Extraocular movements intact.     Conjunctiva/sclera: Conjunctivae normal.  Pulmonary:     Effort: Pulmonary effort is normal.  Musculoskeletal:        General: Normal range of motion.     Cervical back: Normal range of motion.  Neurological:     General: No focal deficit present.     Mental Status: She is alert.  Psychiatric:        Mood and Affect: Mood normal.        Behavior: Behavior normal.      Assessment and Plan:        Encounter for confirmation of pregnancy test result with physical examination -     Pregnancy, urine -     Ambulatory referral to Obstetrics / Gynecology +UPT Continue PNV Reviewed  medications and recommendations for early pregnancy phase. Warning signs including persistent abdominal pain and HVB reviewed. Amb referral to OB/GYN ordered. All questions answered.   Vomiting or nausea of pregnancy -     Doxylamine -Pyridoxine ; Take 1 tablet by mouth 3 (three) times daily as needed.  Dispense: 60 tablet; Refill: 2 Tums and pepcid for heartburn  Vera LULLA Pa, MD

## 2024-02-01 ENCOUNTER — Encounter: Payer: Self-pay | Admitting: Obstetrics and Gynecology

## 2024-02-01 DIAGNOSIS — O209 Hemorrhage in early pregnancy, unspecified: Secondary | ICD-10-CM

## 2024-02-01 DIAGNOSIS — Z3201 Encounter for pregnancy test, result positive: Secondary | ICD-10-CM

## 2024-02-01 NOTE — Telephone Encounter (Signed)
 Call placed to patient, left detailed message, ok per dpr. Advised per Dr. Dallie. Requested return call to further discuss and schedule, [phone], opt 4.

## 2024-02-01 NOTE — Telephone Encounter (Signed)
 Spoke with patient, PUS and labs scheduled at [facility] on 02/05/24 at 0830. Advised I will review f/u plan with Dr. Dallie and f/u via MyChart. ER precautions again reviewed. Patient aware to call if any concerns/questions.   Dr. Dallie -patient is scheduled for PUS only, do you want her worked into your schedule or will you call with results?

## 2024-02-03 ENCOUNTER — Encounter (HOSPITAL_COMMUNITY): Payer: Self-pay | Admitting: *Deleted

## 2024-02-03 ENCOUNTER — Inpatient Hospital Stay (HOSPITAL_COMMUNITY)
Admission: AD | Admit: 2024-02-03 | Discharge: 2024-02-04 | Disposition: A | Discharge status: Home / Self Care | Attending: Obstetrics and Gynecology | Admitting: Obstetrics and Gynecology

## 2024-02-03 ENCOUNTER — Inpatient Hospital Stay (HOSPITAL_COMMUNITY)

## 2024-02-03 DIAGNOSIS — O009 Unspecified ectopic pregnancy without intrauterine pregnancy: Secondary | ICD-10-CM | POA: Insufficient documentation

## 2024-02-03 DIAGNOSIS — O209 Hemorrhage in early pregnancy, unspecified: Secondary | ICD-10-CM | POA: Diagnosis not present

## 2024-02-03 DIAGNOSIS — Z3A01 Less than 8 weeks gestation of pregnancy: Secondary | ICD-10-CM | POA: Diagnosis not present

## 2024-02-03 DIAGNOSIS — M549 Dorsalgia, unspecified: Secondary | ICD-10-CM | POA: Diagnosis not present

## 2024-02-03 DIAGNOSIS — Z3A00 Weeks of gestation of pregnancy not specified: Secondary | ICD-10-CM | POA: Diagnosis not present

## 2024-02-03 DIAGNOSIS — O3680X0 Pregnancy with inconclusive fetal viability, not applicable or unspecified: Secondary | ICD-10-CM | POA: Diagnosis not present

## 2024-02-03 LAB — URINALYSIS, ROUTINE W REFLEX MICROSCOPIC
Bacteria, UA: NONE SEEN
Bilirubin Urine: NEGATIVE
Glucose, UA: NEGATIVE mg/dL
Ketones, ur: NEGATIVE mg/dL
Nitrite: NEGATIVE
Protein, ur: 100 mg/dL — AB
RBC / HPF: >50 RBC/hpf (ref 0–5)
Specific Gravity, Urine: 1.025 (ref 1.005–1.030)
pH: 6 (ref 5.0–8.0)

## 2024-02-03 LAB — ABO/RH: ABO/RH(D): B POS

## 2024-02-03 LAB — WET PREP, GENITAL
Clue Cells Wet Prep HPF POC: NONE SEEN
Sperm: NONE SEEN
Trich, Wet Prep: NONE SEEN
WBC, Wet Prep HPF POC: <10 (ref <10)
Yeast Wet Prep HPF POC: NONE SEEN

## 2024-02-03 LAB — CBC
HCT: 34.6 % — ABNORMAL LOW (ref 36.0–46.0)
Hemoglobin: 11 g/dL — ABNORMAL LOW (ref 12.0–15.0)
MCH: 27.8 pg (ref 26.0–34.0)
MCHC: 31.8 g/dL (ref 30.0–36.0)
MCV: 87.4 fL (ref 80.0–100.0)
Platelets: 244 K/uL (ref 150–400)
RBC: 3.96 MIL/uL (ref 3.87–5.11)
RDW: 12.2 % (ref 11.5–15.5)
WBC: 5.6 K/uL (ref 4.0–10.5)
nRBC: 0 % (ref 0.0–0.2)

## 2024-02-03 NOTE — MAU Note (Signed)
 Connie Cobb is a 33 y.o. at [redacted]w[redacted]d here in MAU reporting: starred spotting on Thursday. Had more  moderate bleeding yesterday and today and back pain   LMP:  Onset of complaint: Thursday Pain score: 5 Vitals:   02/03/24 2155  BP: 123/84  Pulse: 85  Resp: 18  Temp: 98.5 F (36.9 C)     FHT: n/a  Lab orders placed from triage: Wet prep, Gc, U/A

## 2024-02-04 ENCOUNTER — Ambulatory Visit: Admitting: Obstetrics and Gynecology

## 2024-02-04 ENCOUNTER — Other Ambulatory Visit

## 2024-02-04 DIAGNOSIS — O209 Hemorrhage in early pregnancy, unspecified: Secondary | ICD-10-CM

## 2024-02-04 DIAGNOSIS — M549 Dorsalgia, unspecified: Secondary | ICD-10-CM | POA: Diagnosis not present

## 2024-02-04 DIAGNOSIS — O3680X0 Pregnancy with inconclusive fetal viability, not applicable or unspecified: Secondary | ICD-10-CM | POA: Diagnosis not present

## 2024-02-04 DIAGNOSIS — Z3A01 Less than 8 weeks gestation of pregnancy: Secondary | ICD-10-CM | POA: Diagnosis not present

## 2024-02-04 DIAGNOSIS — Z3A00 Weeks of gestation of pregnancy not specified: Secondary | ICD-10-CM | POA: Diagnosis not present

## 2024-02-04 LAB — GC/CHLAMYDIA PROBE AMP (~~LOC~~) NOT AT ARMC
Chlamydia: NEGATIVE
Comment: NEGATIVE
Comment: NORMAL
Neisseria Gonorrhea: NEGATIVE

## 2024-02-04 LAB — HCG, QUANTITATIVE, PREGNANCY: hCG, Beta Chain, Quant, S: 281 m[IU]/mL — ABNORMAL HIGH (ref <5)

## 2024-02-04 NOTE — Telephone Encounter (Signed)
 Dr. Dallie -please review ER visit and advise on f/u.   Lab appt cancelled for today.  PUs scheduled for 8/5, looks like this was completed on 8/4.

## 2024-02-04 NOTE — MAU Provider Note (Signed)
 History     CSN: [id number]  Arrival date and time: 02/03/24 2122   Event Date/Time   First Provider Initiated Contact with Patient 02/04/24 0148      Chief Complaint  Patient presents with   Vaginal Bleeding   Abdominal Pain   HPI Connie Cobb is a  33 y.o. G3P1011 [redacted]w[redacted]d here with complaints of vaginal bleeding. Was on [place] until today  -Friday, some spotting, mild in nature. It was pink. Patient reports she was even wearing a [place] dress that day without issue.  -Sunday- more heavy bleeding, some mild back pain associated Was on [place], returned to [place] at 5 PM and came here to MAU Communicated with OB group about her symptoms.  +FM, denies LOF, VB, contractions, vaginal discharge.  OB History     Gravida  3   Para  1   Term  1   Preterm      AB  1   Living  1      SAB      IAB      Ectopic      Multiple  0   Live Births  1           Past Medical History:  Diagnosis Date   Allergy    Anemia    GERD (gastroesophageal reflux disease)    Migraine     Past Surgical History:  Procedure Laterality Date   TONSILLECTOMY      Family History  Problem Relation Age of Onset   Diabetes Father    Hyperlipidemia Father    Hypertension Father    Cancer Maternal Grandmother    Hypertension Maternal Grandmother    Diabetes Paternal Grandmother    Heart disease Paternal Grandmother    Cancer Paternal Grandfather    Diabetes Other        grandparent   Breast cancer Other        grandmother    Social History   Tobacco Use   Smoking status: Never   Smokeless tobacco: Never  Vaping Use   Vaping status: Never Used  Substance Use Topics   Alcohol use: No    Alcohol/week: 0.0 standard drinks of alcohol   Drug use: No    Allergies:  Allergies  Allergen Reactions   Banana Anaphylaxis   Sulfonamide Derivatives Hives and Shortness Of Breath    Medications Prior to Admission  Medication Sig Dispense Refill Last Dose/Taking    amphetamine -dextroamphetamine  (ADDERALL) 30 MG tablet Take 1 tablet by mouth daily. 90 tablet 0    cetirizine  (ZYRTEC  ALLERGY) 10 MG tablet Take 1 tablet (10 mg total) by mouth daily. 100 tablet 0    clindamycin  (CLEOCIN  T) 1 % SWAB Apply 1 Swab topically daily to wash face. 60 each 2    Doxylamine -Pyridoxine  10-10 MG TBEC Take 1 tablet by mouth 3 (three) times daily as needed. 60 tablet 2    hydrocortisone  (ANUSOL -HC) 25 MG suppository Place 1 suppository (25 mg total) rectally 2 (two) times daily. 12 suppository 0    hydrOXYzine  (ATARAX ) 25 MG tablet Take 1 tablet (25 mg total) by mouth at bedtime as needed. 90 tablet 3    ipratropium (ATROVENT ) 0.03 % nasal spray Place 2 sprays into both nostrils 2 (two) times daily. 30 mL 0    Prenatal Vit-Fe Fumarate-FA (PRENATAL VITAMIN PO) Take by mouth.       Review of Systems  Constitutional:  Negative for chills and fever.  HENT:  Negative for  congestion and sore throat.   Eyes:  Negative for pain and visual disturbance.  Respiratory:  Negative for cough, chest tightness and shortness of breath.   Cardiovascular:  Negative for chest pain.  Gastrointestinal:  Negative for abdominal pain, diarrhea, nausea and vomiting.  Endocrine: Negative for cold intolerance and heat intolerance.  Genitourinary:  Positive for vaginal bleeding. Negative for dysuria and flank pain.  Musculoskeletal:  Negative for back pain.  Skin:  Negative for rash.  Allergic/Immunologic: Negative for food allergies.  Neurological:  Negative for dizziness and light-headedness.  Psychiatric/Behavioral:  Negative for agitation.    Physical Exam   Blood pressure 123/84, pulse 85, temperature 98.5 F (36.9 C), resp. rate 18, height 5' 7 (1.702 m), weight 73.5 kg, last menstrual period 12/23/2023.  Physical Exam Vitals and nursing note reviewed.  Constitutional:      Appearance: Normal appearance.  HENT:     Head: Normocephalic and atraumatic.     Nose: Nose normal.      Mouth/Throat:     Mouth: Mucous membranes are moist.  Eyes:     Conjunctiva/sclera: Conjunctivae normal.  Cardiovascular:     Rate and Rhythm: Normal rate.  Pulmonary:     Effort: Pulmonary effort is normal.  Abdominal:     General: Abdomen is flat.     Palpations: Abdomen is soft.  Musculoskeletal:     Cervical back: Normal range of motion.  Skin:    General: Skin is warm.     Capillary Refill: Capillary refill takes less than 2 seconds.  Neurological:     General: No focal deficit present.     Mental Status: She is alert.  Psychiatric:        Mood and Affect: Mood normal.     MAU Course  Procedures  MDM: high  This patient presents to the ED for concern of   Chief Complaint  Patient presents with   Vaginal Bleeding   Abdominal Pain     These complains involves an extensive number of treatment options, and is a complaint that carries with it a high risk of complications and morbidity.  The differential diagnosis for  1.vaginal bleeding in early pregnancy INCLUDES threatened miscarriage, ectopic pregnancy (unless IUP confirmed), normal variant bleeding with live IUP-mostly likely subchorionic hemorrhage in this case. Most likely for this patient is normal variant but also given history of bleeding on Sunday would   Co morbidities that complicate the patient evaluation: None  External records from outside source obtained and reviewed including Scanned media records, CareEverywhere, and Prenatal care records  Lab Tests: I ordered, and personally interpreted labs.  The pertinent results include:   Results for orders placed or performed during the hospital encounter of 02/03/24 (from the past 24 hours)  Urinalysis, Routine w reflex microscopic -Urine, Clean Catch     Status: Abnormal   Collection Time: 02/03/24  9:57 PM  Result Value Ref Range   Color, Urine RED (A) YELLOW   APPearance CLOUDY (A) CLEAR   Specific Gravity, Urine 1.025 1.005 - 1.030   pH 6.0 5.0 - 8.0    Glucose, UA NEGATIVE NEGATIVE mg/dL   Hgb urine dipstick LARGE (A) NEGATIVE   Bilirubin Urine NEGATIVE NEGATIVE   Ketones, ur NEGATIVE NEGATIVE mg/dL   Protein, ur 899 (A) NEGATIVE mg/dL   Nitrite NEGATIVE NEGATIVE   Leukocytes,Ua TRACE (A) NEGATIVE   RBC / HPF >50 0 - 5 RBC/hpf   WBC, UA 6-10 0 - 5 WBC/hpf   Bacteria,  UA NONE SEEN NONE SEEN   Squamous Epithelial / HPF 0-5 0 - 5 /HPF   Mucus PRESENT   Wet prep, genital     Status: None   Collection Time: 02/03/24  9:57 PM   Specimen: PATH Cytology Cervicovaginal Ancillary Only  Result Value Ref Range   Yeast Wet Prep HPF POC NONE SEEN NONE SEEN   Trich, Wet Prep NONE SEEN NONE SEEN   Clue Cells Wet Prep HPF POC NONE SEEN NONE SEEN   WBC, Wet Prep HPF POC <10 <10   Sperm NONE SEEN   ABO/Rh     Status: None   Collection Time: 02/03/24 11:11 PM  Result Value Ref Range   ABO/RH(D) B POS    No rh immune globuloin      NOT A RH IMMUNE GLOBULIN CANDIDATE, PT RH POSITIVE Performed at [facility], [address] [place], [place] [address]   CBC     Status: Abnormal   Collection Time: 02/03/24 11:13 PM  Result Value Ref Range   WBC 5.6 4.0 - 10.5 K/uL   RBC 3.96 3.87 - 5.11 MIL/uL   Hemoglobin 11.0 (L) 12.0 - 15.0 g/dL   HCT 65.3 (L) 63.9 - 53.9 %   MCV 87.4 80.0 - 100.0 fL   MCH 27.8 26.0 - 34.0 pg   MCHC 31.8 30.0 - 36.0 g/dL   RDW 87.7 88.4 - 84.4 %   Platelets 244 150 - 400 K/uL   nRBC 0.0 0.0 - 0.2 %  hCG, quantitative, pregnancy     Status: Abnormal   Collection Time: 02/03/24 11:13 PM  Result Value Ref Range   hCG, Beta Chain, Quant, S 281 (H) <5 mIU/mL    Imaging Studies ordered:  I ordered imaging studies includingTransvaginal US  I independently visualized and interpreted imaging which showed no IUP I agree with the radiologist interpretation   MAU Course: Reviewed results with patient Discussed need for repeat [facility] Has appts on 8/4, 8/5 and 8/6  After the interventions noted above, I reevaluated the  patient and found that they have :improved  Dispostion: discharged   Assessment and Plan   1. Vaginal bleeding affecting early pregnancy   2. [redacted] weeks gestation of pregnancy    Discharged stable condition Reviewed return precautions Has follow up plan with [facility] scheduled  Suzen Maryan Masters 02/04/2024, 1:48 AM

## 2024-02-04 NOTE — Discharge Instructions (Addendum)
 You were seen at the maternity assessment unit for vaginal bleeding in the setting of a positive pregnancy test.  Your pregnancy test here was positive as well and your lab work was reassuring.  You did receive an ultrasound that did not show pregnancy inside your uterus but it also did not see a pregnancy outside your uterus.. However your pregnancy hormone level was quite low (281).  We recommend that we do a repeat pregnancy hormone level in about 48 hours to see if this is rising or falling.  Given the fact that you have had significant vaginal bleeding and back pain this is consistent with a miscarriage however we do need to repeat your pregnancy blood test to help confirm this.  Please return to the maternity assessment unit if you have -Heavy vaginal bleeding that is filling up more than 1 pad an hour -Worsening or significant abdominal pain Or any other pregnancy related concern  Please keep your appointment tomorrow with you OB Group to check your pregnancy hormone level.   Future Appointments  Date Time Provider Department Center  02/04/2024  8:30 AM [facility] LAB [facility] None  02/05/2024  8:30 AM [facility] US  RM 1 [facility] None  02/06/2024 10:30 AM Dallie Vera GAILS, MD [facility] None  02/07/2024  8:00 AM Mercer Clotilda SAUNDERS, MD [facility]  02/07/2024  2:15 PM Alm Delon SAILOR, DO [facility] None  01/12/2025  8:00 AM Dallie Vera GAILS, MD [facility] None

## 2024-02-04 NOTE — Telephone Encounter (Signed)
 Spoke with patient, advised per Dr. Dallie.   PUS scheduled with labs on 02/05/24 at 0830.   Consult with Dr. Dallie on 8/6 at 1030.   Patient reports no bleeding or pain at this time. Is aware to call if any changes.   Routing to provider for final review. Patient is agreeable to disposition. Will close encounter.

## 2024-02-04 NOTE — Telephone Encounter (Signed)
 Connie Cobb [facility] Triage This patient called to cancel their US  for tomorrow. She said she got it done at the ED. The ED told her to keep her follow up OV with DR hines 8/6. Thanks

## 2024-02-05 ENCOUNTER — Other Ambulatory Visit

## 2024-02-05 ENCOUNTER — Ambulatory Visit: Payer: Commercial Managed Care - PPO | Admitting: Dermatology

## 2024-02-05 ENCOUNTER — Ambulatory Visit (INDEPENDENT_AMBULATORY_CARE_PROVIDER_SITE_OTHER)

## 2024-02-05 DIAGNOSIS — O209 Hemorrhage in early pregnancy, unspecified: Secondary | ICD-10-CM | POA: Diagnosis not present

## 2024-02-05 DIAGNOSIS — Z3201 Encounter for pregnancy test, result positive: Secondary | ICD-10-CM | POA: Diagnosis not present

## 2024-02-05 DIAGNOSIS — Z3A00 Weeks of gestation of pregnancy not specified: Secondary | ICD-10-CM | POA: Diagnosis not present

## 2024-02-05 LAB — HCG, QUANTITATIVE, PREGNANCY: HCG, Total, QN: 155 m[IU]/mL

## 2024-02-06 ENCOUNTER — Encounter: Payer: Self-pay | Admitting: Obstetrics and Gynecology

## 2024-02-06 ENCOUNTER — Ambulatory Visit: Admitting: Obstetrics and Gynecology

## 2024-02-06 VITALS — BP 108/64 | HR 71 | Temp 98.4°F | Wt 159.0 lb

## 2024-02-06 DIAGNOSIS — Z3A00 Weeks of gestation of pregnancy not specified: Secondary | ICD-10-CM

## 2024-02-06 DIAGNOSIS — O209 Hemorrhage in early pregnancy, unspecified: Secondary | ICD-10-CM | POA: Diagnosis not present

## 2024-02-06 NOTE — Progress Notes (Signed)
 33 y.o. G71P1011 female here for vaginal bleeding in early pregnancy. 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 RN, Designer, television/film set at [employer].   Patient's last menstrual period was 12/23/2023 (approximate).   She reports VB that started 01/31/24. Seen at ED on 02/03/24 with HCG 255 and unremarkable TVUS (no IUP or adnexal mass). Minimal spotting now, lower back pain, some nausea.  HCG: 8/3 281, Hgb 11.0, BPOS 8/5 155  GYN HISTORY: Chlamydia, 2013   OB History  Gravida Para Term Preterm AB Living  3 1 1  1 1   SAB IAB Ectopic Multiple Live Births     0 1    # Outcome Date GA Lbr Len/2nd Weight Sex Type Anes PTL Lv  3 Current           2 Term 01/27/16 [redacted]w[redacted]d 19:09 / 00:30 8 lb 4.5 oz (3.755 kg) M Vag-Spont None  LIV     Birth Comments: Skin tear on scalp  1 AB 06/02/08           Past Medical History:  Diagnosis Date   Allergy    Anemia    GERD (gastroesophageal reflux disease)    Migraine    Past Surgical History:  Procedure Laterality Date   TONSILLECTOMY     Current Outpatient Medications on File Prior to Visit  Medication Sig Dispense Refill   amphetamine -dextroamphetamine  (ADDERALL) 30 MG tablet Take 1 tablet by mouth daily. 90 tablet 0   cetirizine  (ZYRTEC  ALLERGY) 10 MG tablet Take 1 tablet (10 mg total) by mouth daily. 100 tablet 0   clindamycin  (CLEOCIN  T) 1 % SWAB Apply 1 Swab topically daily to wash face. 60 each 2   Doxylamine -Pyridoxine  10-10 MG TBEC Take 1 tablet by mouth 3 (three) times daily as needed. 60 tablet 2   hydrocortisone  (ANUSOL -HC) 25 MG suppository Place 1 suppository (25 mg total) rectally 2 (two) times daily. 12 suppository 0   hydrOXYzine  (ATARAX ) 25 MG tablet Take 1 tablet (25 mg total) by mouth at bedtime as needed. 90 tablet 3   ipratropium (ATROVENT ) 0.03 % nasal spray Place 2 sprays into both nostrils 2 (two) times daily. 30 mL 0   Prenatal Vit-Fe Fumarate-FA (PRENATAL VITAMIN PO) Take by mouth.     No current facility-administered medications on file prior to visit.    Allergies  Allergen Reactions   Banana Anaphylaxis   Sulfonamide Derivatives Hives and Shortness Of Breath      PE Today's Vitals   02/06/24 1041  BP: 108/64  Pulse: 71  Temp: 98.4 F (36.9 C)  TempSrc: Oral  SpO2: 99%  Weight: 159 lb (72.1 kg)   Body mass index is 24.9 kg/m.  Physical Exam Vitals reviewed.  Constitutional:      General: She is not in acute distress.    Appearance: Normal appearance.  HENT:     Head: Normocephalic and atraumatic.     Nose: Nose normal.  Eyes:     Extraocular Movements: Extraocular movements intact.     Conjunctiva/sclera: Conjunctivae normal.  Pulmonary:     Effort: Pulmonary effort is normal.  Abdominal:     General: There is no distension.     Tenderness: There is no guarding or rebound.  Musculoskeletal:        General: Normal range of motion.     Cervical back: Normal range of motion.  Neurological:     General: No focal deficit present.     Mental Status: She is alert.  Psychiatric:  Mood and Affect: Mood normal.        Behavior: Behavior normal.     02/05/2024 TVUS: Indications: Vaginal bleeding in early pregnancy LMP: 12/23/23, GA [redacted]w[redacted]d  Findings:   Uterus: Anteverted. Endometrial thickness: Thin endometrial stripe.  No gestational sac, yolk sac, or fetal pole identified. Left ovary: 2.2 x 1.4 x 1.2 cm, normal-appearing Right ovary: 3.2 x 1.5 x 1.4 cm, normal-appearing. No free fluid or adnexal mass present.  Impression:  Normal transvaginal ultrasound.  No intrauterine pregnancy or adnexal mass identified.  Vera LULLA Pa, MD    Assessment and Plan:        Vaginal bleeding affecting early pregnancy -     hCG, quantitative, pregnancy  Likely miscarriage given ~50% decrease in hCG over 48 hours. TVUS reviewed with no IUP or adnexal mass. Continue to monitor bleeding Weekly hCG until 0  Vera LULLA Pa, MD

## 2024-02-07 ENCOUNTER — Ambulatory Visit: Payer: Commercial Managed Care - PPO | Admitting: Dermatology

## 2024-02-07 ENCOUNTER — Other Ambulatory Visit (HOSPITAL_COMMUNITY): Payer: Self-pay

## 2024-02-07 ENCOUNTER — Encounter: Payer: Self-pay | Admitting: Family Medicine

## 2024-02-07 ENCOUNTER — Ambulatory Visit (INDEPENDENT_AMBULATORY_CARE_PROVIDER_SITE_OTHER): Admitting: Family Medicine

## 2024-02-07 VITALS — BP 120/70 | HR 85 | Temp 98.8°F | Ht 67.0 in | Wt 158.6 lb

## 2024-02-07 DIAGNOSIS — Z0000 Encounter for general adult medical examination without abnormal findings: Secondary | ICD-10-CM

## 2024-02-07 DIAGNOSIS — G4700 Insomnia, unspecified: Secondary | ICD-10-CM

## 2024-02-07 DIAGNOSIS — F900 Attention-deficit hyperactivity disorder, predominantly inattentive type: Secondary | ICD-10-CM

## 2024-02-07 DIAGNOSIS — E559 Vitamin D deficiency, unspecified: Secondary | ICD-10-CM

## 2024-02-07 DIAGNOSIS — L810 Postinflammatory hyperpigmentation: Secondary | ICD-10-CM

## 2024-02-07 DIAGNOSIS — F419 Anxiety disorder, unspecified: Secondary | ICD-10-CM

## 2024-02-07 DIAGNOSIS — L709 Acne, unspecified: Secondary | ICD-10-CM

## 2024-02-07 DIAGNOSIS — L700 Acne vulgaris: Secondary | ICD-10-CM

## 2024-02-07 DIAGNOSIS — R110 Nausea: Secondary | ICD-10-CM | POA: Diagnosis not present

## 2024-02-07 DIAGNOSIS — D649 Anemia, unspecified: Secondary | ICD-10-CM | POA: Diagnosis not present

## 2024-02-07 LAB — LIPID PANEL
Cholesterol: 144 mg/dL (ref 0–200)
HDL: 47.5 mg/dL (ref >39.00)
LDL Cholesterol: 83 mg/dL (ref 0–99)
NonHDL: 96.92
Total CHOL/HDL Ratio: 3
Triglycerides: 70 mg/dL (ref 0.0–149.0)
VLDL: 14 mg/dL (ref 0.0–40.0)

## 2024-02-07 LAB — CBC WITH DIFFERENTIAL/PLATELET
Basophils Absolute: 0 K/uL (ref 0.0–0.1)
Basophils Relative: 0.6 % (ref 0.0–3.0)
Eosinophils Absolute: 0.1 K/uL (ref 0.0–0.7)
Eosinophils Relative: 1.9 % (ref 0.0–5.0)
HCT: 37.6 % (ref 36.0–46.0)
Hemoglobin: 12.4 g/dL (ref 12.0–15.0)
Lymphocytes Relative: 40.8 % (ref 12.0–46.0)
Lymphs Abs: 1.4 K/uL (ref 0.7–4.0)
MCHC: 33.1 g/dL (ref 30.0–36.0)
MCV: 84.2 fl (ref 78.0–100.0)
Monocytes Absolute: 0.3 K/uL (ref 0.1–1.0)
Monocytes Relative: 8.8 % (ref 3.0–12.0)
Neutro Abs: 1.7 K/uL (ref 1.4–7.7)
Neutrophils Relative %: 47.9 % (ref 43.0–77.0)
Platelets: 244 K/uL (ref 150.0–400.0)
RBC: 4.47 Mil/uL (ref 3.87–5.11)
RDW: 12.9 % (ref 11.5–15.5)
WBC: 3.5 K/uL — ABNORMAL LOW (ref 4.0–10.5)

## 2024-02-07 LAB — T4, FREE: Free T4: 0.9 ng/dL (ref 0.60–1.60)

## 2024-02-07 LAB — VITAMIN D 25 HYDROXY (VIT D DEFICIENCY, FRACTURES): VITD: 21.57 ng/mL — ABNORMAL LOW (ref 30.00–100.00)

## 2024-02-07 LAB — COMPREHENSIVE METABOLIC PANEL WITH GFR
ALT: 15 U/L (ref 0–35)
AST: 17 U/L (ref 0–37)
Albumin: 4.6 g/dL (ref 3.5–5.2)
Alkaline Phosphatase: 32 U/L — ABNORMAL LOW (ref 39–117)
BUN: 10 mg/dL (ref 6–23)
CO2: 24 meq/L (ref 19–32)
Calcium: 9.2 mg/dL (ref 8.4–10.5)
Chloride: 104 meq/L (ref 96–112)
Creatinine, Ser: 0.86 mg/dL (ref 0.40–1.20)
GFR: 88.56 mL/min (ref >60.00)
Glucose, Bld: 82 mg/dL (ref 70–99)
Potassium: 4.2 meq/L (ref 3.5–5.1)
Sodium: 142 meq/L (ref 135–145)
Total Bilirubin: 0.4 mg/dL (ref 0.2–1.2)
Total Protein: 6.7 g/dL (ref 6.0–8.3)

## 2024-02-07 LAB — TSH: TSH: 0.7 u[IU]/mL (ref 0.35–5.50)

## 2024-02-07 LAB — VITAMIN B12: Vitamin B-12: 275 pg/mL (ref 211–911)

## 2024-02-07 LAB — HEMOGLOBIN A1C: Hgb A1c MFr Bld: 6 % (ref 4.6–6.5)

## 2024-02-07 MED ORDER — ONDANSETRON 4 MG PO TBDP
4.0000 mg | ORAL_TABLET | Freq: Three times a day (TID) | ORAL | 2 refills | Status: AC | PRN
  Filled 2024-02-07: qty 20, 7d supply, fill #0
  Filled 2024-03-20: qty 20, 7d supply, fill #1
  Filled 2024-05-15: qty 20, 7d supply, fill #2

## 2024-02-07 MED ORDER — AMPHETAMINE-DEXTROAMPHETAMINE 30 MG PO TABS
30.0000 mg | ORAL_TABLET | Freq: Every day | ORAL | 0 refills | Status: AC
  Filled 2024-02-07: qty 90, 90d supply, fill #0

## 2024-02-07 MED ORDER — SPIRONOLACTONE 100 MG PO TABS
100.0000 mg | ORAL_TABLET | Freq: Every day | ORAL | 3 refills | Status: DC
  Filled 2024-02-07: qty 30, 30d supply, fill #0
  Filled 2024-03-20: qty 30, 30d supply, fill #1

## 2024-02-07 MED ORDER — HYDROXYZINE HCL 25 MG PO TABS
25.0000 mg | ORAL_TABLET | Freq: Every evening | ORAL | 3 refills | Status: AC | PRN
  Filled 2024-02-07: qty 90, 90d supply, fill #0

## 2024-02-07 MED ORDER — CLINDAMYCIN PHOSPHATE 1 % EX SWAB
1.0000 | Freq: Every day | CUTANEOUS | 3 refills | Status: DC
  Filled 2024-02-07 – 2024-03-20 (×2): qty 60, 60d supply, fill #0

## 2024-02-07 NOTE — Progress Notes (Signed)
 Established Patient Office Visit   Subjective  Patient ID: Connie Cobb, female    DOB: July 29, 1990  Age: 33 y.o. MRN: [id number]  Chief Complaint  Patient presents with   Annual Exam    Pt is a 33 yo female seen for CPE.  Pt seen in ED 02/05/24 for vaginal bleeding d/t miscarriage as u/s witohut IUP and hCG elevated.  Has appt with OB/Gyn to repeat blood work.  Pt seen for irregular menses 5/7, hCG at time was negative.  Still experiencing nausea and fatigue.  Pt requesting refill on Adderall, zofran , hydroxyzine .    Patient Active Problem List   Diagnosis Date Noted   Attention deficit hyperactivity disorder (ADHD), predominantly inattentive type 10/09/2022   Ringing in right ear 06/15/2014   Inattention 01/23/2013   Migraine headache 01/23/2013   Chlamydia 12/22/2011   GERD (gastroesophageal reflux disease) 10/29/2011   Sinusitis 08/21/2011   Allergic rhinitis, seasonal 08/21/2011   Lower urinary tract infectious disease 08/21/2011   Encounter for routine gynecological examination 05/22/2011   Family history of breast cancer 05/22/2011   Contact with or exposure to other viral diseases(V01.79) 12/01/2010   Anxiety 12/01/2010   Dermatophytosis 08/01/2010   ANEMIA-NOS 05/19/2010   Past Medical History:  Diagnosis Date   Allergy    Anemia    GERD (gastroesophageal reflux disease)    Migraine    Past Surgical History:  Procedure Laterality Date   TONSILLECTOMY     Social History   Tobacco Use   Smoking status: Never   Smokeless tobacco: Never  Vaping Use   Vaping status: Never Used  Substance Use Topics   Alcohol use: No    Alcohol/week: 0.0 standard drinks of alcohol   Drug use: No   Family History  Problem Relation Age of Onset   Diabetes Father    Hyperlipidemia Father    Hypertension Father    Cancer Maternal Grandmother    Hypertension Maternal Grandmother    Diabetes Paternal Grandmother    Heart disease Paternal Grandmother    Cancer Paternal  Grandfather    Diabetes Other        grandparent   Breast cancer Other        grandmother   Stroke Maternal Aunt    Allergies  Allergen Reactions   Banana Anaphylaxis   Sulfonamide Derivatives Hives and Shortness Of Breath    ROS Negative unless stated above    Objective:     LMP 12/23/2023 (Approximate)  BP Readings from Last 3 Encounters:  02/06/24 108/64  02/03/24 123/84  01/23/24 118/64   Wt Readings from Last 3 Encounters:  02/06/24 159 lb (72.1 kg)  02/03/24 162 lb (73.5 kg)  01/23/24 159 lb (72.1 kg)      Physical Exam Constitutional:      Appearance: Normal appearance.  HENT:     Head: Normocephalic and atraumatic.     Right Ear: Tympanic membrane, ear canal and external ear normal.     Left Ear: Tympanic membrane, ear canal and external ear normal.     Nose: Nose normal.     Mouth/Throat:     Mouth: Mucous membranes are moist.     Pharynx: No oropharyngeal exudate or posterior oropharyngeal erythema.  Eyes:     General: No scleral icterus.    Extraocular Movements: Extraocular movements intact.     Conjunctiva/sclera: Conjunctivae normal.     Pupils: Pupils are equal, round, and reactive to light.  Neck:     Thyroid : No  thyromegaly.  Cardiovascular:     Rate and Rhythm: Normal rate and regular rhythm.     Pulses: Normal pulses.     Heart sounds: Normal heart sounds. No murmur heard.    No friction rub.  Pulmonary:     Effort: Pulmonary effort is normal.     Breath sounds: Normal breath sounds. No wheezing, rhonchi or rales.  Abdominal:     General: Bowel sounds are normal.     Palpations: Abdomen is soft.     Tenderness: There is no abdominal tenderness.  Musculoskeletal:        General: No deformity. Normal range of motion.  Lymphadenopathy:     Cervical: No cervical adenopathy.  Skin:    General: Skin is warm and dry.     Findings: No lesion.  Neurological:     General: No focal deficit present.     Mental Status: She is alert and  oriented to person, place, and time.  Psychiatric:        Mood and Affect: Mood normal.        Thought Content: Thought content normal.        02/07/2024    9:08 AM 01/10/2024    9:25 AM 05/21/2023    2:23 PM  Depression screen PHQ 2/9  Decreased Interest 0 0 0  Down, Depressed, Hopeless 0 0 0  PHQ - 2 Score 0 0 0  Altered sleeping 0    Tired, decreased energy 1    Change in appetite 0    Feeling bad or failure about yourself  0    Trouble concentrating 0    Moving slowly or fidgety/restless 0    Suicidal thoughts 0    PHQ-9 Score 1    Difficult doing work/chores Not difficult at all        02/07/2024    9:08 AM  GAD 7 : Generalized Anxiety Score  Nervous, Anxious, on Edge 0  Control/stop worrying 0  Worry too much - different things 0  Trouble relaxing 0  Restless 0  Easily annoyed or irritable 0  Afraid - awful might happen 0  Total GAD 7 Score 0  Anxiety Difficulty Not difficult at all     No results found for any visits on 02/07/24.    Assessment & Plan:   Well adult exam -     CBC with Differential/Platelet; Future -     Comprehensive metabolic panel with GFR; Future -     Hemoglobin A1c; Future -     Lipid panel; Future -     T4, free; Future -     TSH; Future  Anxiety -     T4, free; Future -     TSH; Future  Insomnia, unspecified type -     hydrOXYzine  HCl; Take 1 tablet (25 mg total) by mouth at bedtime as needed.  Dispense: 90 tablet; Refill: 3  Attention deficit hyperactivity disorder (ADHD), predominantly inattentive type -     Amphetamine -Dextroamphetamine ; Take 1 tablet by mouth daily.  Dispense: 90 tablet; Refill: 0  Nausea -     Ondansetron ; Take 1 tablet (4 mg total) by mouth every 8 (eight) hours as needed for nausea or vomiting.  Dispense: 20 tablet; Refill: 2  Vitamin D  insufficiency -     VITAMIN D  25 Hydroxy (Vit-D Deficiency, Fractures); Future  Anemia, unspecified type -     CBC with Differential/Platelet; Future -      Vitamin B12; Future  Age appropriate  health screenings discussed.  Obtain labs.  Immunizations reviewed.  Next CPE in 1 yr. Encouraged to keep appts with OB/Gyn for serial hCGs until normal.    Return if symptoms worsen or fail to improve.   Clotilda JONELLE Single, MD

## 2024-02-07 NOTE — Patient Instructions (Signed)
 Here is a list of some of the area OB/Gyn providers.  It is not an all inclusive list but should help you get started.  [facility] -Jaymes Graff, MD  -Hoover Browns, MD - Osborn Coho, MD -Marline Backbone, MD ---Dierdre Forth, MD is listed on their site, but I'm pretty sure she retired.  [facility] -Ellison Hughs, MD -Pryor Ochoa, DO  [facility] -Gerald Leitz, MD  -Steva Ready, DO  [facility] -Derl Barrow, MD -Marlow Baars, MD  [facility] Shea Evans, MD  [facility] for [facility] Yevette Edwards, MD  Leda Quail, MD ---only does gynecology now, not obstetrics.

## 2024-02-07 NOTE — Progress Notes (Signed)
   Follow-Up Visit   Subjective  Connie Cobb is a 33 y.o. female who presents for the following: Acne Vulgaris  Connie Cobb is here for a follow up on acne and post-inflammatory hyperpigmentation. She was last seen 08/16/23 and was recommended to continue Clindamycin  swabs, increase tretinoin  0.025 to five nights a week, as well as start Spironolactone  50mg . She had stopped using the Tretinoin  0.025 and Spironolactone  due to pregnancy. About three weeks ago, she suffered a miscarriage and is now back on her regimens.   The following portions of the chart were reviewed this encounter and updated as appropriate: medications, allergies, medical history  Review of Systems:  No other skin or systemic complaints except as noted in HPI or Assessment and Plan.  Objective  Well appearing patient in no apparent distress; mood and affect are within normal limits.  Areas Examined: Face, chest and back  Relevant exam findings are noted in the Assessment and Plan.   Assessment & Plan   1. Hormonal Acne - Assessment: Patient was previously doing well on oral spironolactone  50 mg, morning clindamycin , and nightly tretinoin . Treatment was interrupted due to pregnancy, but patient experienced a miscarriage in the first trimester. She is now looking to restart her acne regimen. Despite good progress with spironolactone , patient is still experiencing hormonal flares on the jawline. - Plan:    Increase spironolactone  to 100 mg PO daily at nighttime    Monitor for side effects such as lightheadedness or dizziness    Continue clindamycin  swabs in the morning    Restart tretinoin  at night    Begin with 2 nights per week, gradually increasing to every other night    Add Avene Cicalfate balm to promote hydration and decrease dryness    Morning routine: gentle cleanser, Excedrin Radiant Tone serum, clindamycin , moisturizer with sunscreen    Night routine: tretinoin  nights: cleanser, Excedrin Radiant Tone serum,  tretinoin , Cicalfate balm; non-tretinoin  nights: cleanser, Excedrin Radiant Tone serum, Cicalfate balm  2. Post-inflammatory Hyperpigmentation - Assessment: Patient presents with ongoing post-inflammatory hyperpigmentation related to hormonal acne. Previous treatment regimen was interrupted due to pregnancy but can now be restarted and augmented. - Plan:    Add Eucerin Radiant Tone serum to morning and night routines    Incorporate into skincare regimen as detailed in acne treatment plan    Reassess efficacy at 72-month follow-up appointment  Follow up in 4 months for reassessment and potential regimen adjustment     No follow-ups on file.  I, Gordan Beams, CMA, am acting as scribe for [employer], DO.   Documentation: I have reviewed the above documentation for accuracy and completeness, and I agree with the above.  Delon Lenis, DO

## 2024-02-07 NOTE — Patient Instructions (Addendum)
 Date: Thu Feb 07 2024  Connie Cobb,  Thank you for visiting today. Here is a summary of the key instructions:  - Medications:   - Increase spironolactone  to 100 mg daily at nighttime   - Continue clindamycin  swabs in the morning   - Restart tretinoin  2 nights a week, slowly increasing to every other night   - Add Avene Cicalfate balm to your routine   - Add Eucerin Radiant Tone Serum to your routine  - Skin Care:   - Wash face with gentle cleanser   - Apply Eucerin Radiant Tone Serum morning and night   - Apply moisturizer with sunscreen in the morning   - Apply Avene Cicalfate balm at night  - Follow-up:   - Return in 4 months for reassessment and possible regimen adjustments  - Other Instructions:   - Watch for lightheadedness or dizziness from spironolactone    - Message through MyChart for any questions  Please reach out if you have any questions or concerns.  Warm regards,  Dr. Delon Lenis [facility]  Important Information  Due to recent changes in healthcare laws, you may see results of your pathology and/or laboratory studies on MyChart before the doctors have had a chance to review them. We understand that in some cases there may be results that are confusing or concerning to you. Please understand that not all results are received at the same time and often the doctors may need to interpret multiple results in order to provide you with the best plan of care or course of treatment. Therefore, we ask that you please give us  2 business days to thoroughly review all your results before contacting the office for clarification. Should we see a critical lab result, you will be contacted sooner.   If You Need Anything After Your Visit  If you have any questions or concerns for your doctor, please call our main line at [phone] If no one answers, please leave a voicemail as directed and we will return your call as soon as possible. Messages left after 4 pm will be  answered the following business day.   You may also send us  a message via MyChart. We typically respond to MyChart messages within 1-2 business days.  For prescription refills, please ask your pharmacy to contact our office. Our fax number is [phone].  If you have an urgent issue when the clinic is closed that cannot wait until the next business day, you can page your doctor at the number below.    Please note that while we do our best to be available for urgent issues outside of office hours, we are not available 24/7.   If you have an urgent issue and are unable to reach us , you may choose to seek medical care at your doctor's office, retail clinic, urgent care center, or emergency room.  If you have a medical emergency, please immediately call 911 or go to the emergency department. In the event of inclement weather, please call our main line at [phone] for an update on the status of any delays or closures.  Dermatology Medication Tips: Please keep the boxes that topical medications come in in order to help keep track of the instructions about where and how to use these. Pharmacies typically print the medication instructions only on the boxes and not directly on the medication tubes.   If your medication is too expensive, please contact our office at [phone] or send us  a message through MyChart.   We  are unable to tell what your co-pay for medications will be in advance as this is different depending on your insurance coverage. However, we may be able to find a substitute medication at lower cost or fill out paperwork to get insurance to cover a needed medication.   If a prior authorization is required to get your medication covered by your insurance company, please allow us  1-2 business days to complete this process.  Drug prices often vary depending on where the prescription is filled and some pharmacies may offer cheaper prices.  The website www.goodrx.com contains  coupons for medications through different pharmacies. The prices here do not account for what the cost may be with help from insurance (it may be cheaper with your insurance), but the website can give you the price if you did not use any insurance.  - You can print the associated coupon and take it with your prescription to the pharmacy.  - You may also stop by our office during regular business hours and pick up a GoodRx coupon card.  - If you need your prescription sent electronically to a different pharmacy, notify our office through [facility] or by phone at [phone]

## 2024-02-08 ENCOUNTER — Ambulatory Visit: Payer: Self-pay | Admitting: Family Medicine

## 2024-02-08 DIAGNOSIS — E559 Vitamin D deficiency, unspecified: Secondary | ICD-10-CM

## 2024-02-08 MED ORDER — VITAMIN D (ERGOCALCIFEROL) 1.25 MG (50000 UNIT) PO CAPS
50000.0000 [IU] | ORAL_CAPSULE | ORAL | 0 refills | Status: AC
Start: 2024-02-08 — End: ?

## 2024-02-10 ENCOUNTER — Encounter: Payer: Self-pay | Admitting: Dermatology

## 2024-02-11 ENCOUNTER — Other Ambulatory Visit (HOSPITAL_COMMUNITY): Payer: Self-pay

## 2024-02-12 ENCOUNTER — Other Ambulatory Visit

## 2024-02-12 DIAGNOSIS — O209 Hemorrhage in early pregnancy, unspecified: Secondary | ICD-10-CM | POA: Diagnosis not present

## 2024-02-12 LAB — HCG, QUANTITATIVE, PREGNANCY: HCG, Total, QN: 18 m[IU]/mL

## 2024-02-14 ENCOUNTER — Ambulatory Visit: Payer: Self-pay | Admitting: Obstetrics and Gynecology

## 2024-02-14 DIAGNOSIS — O039 Complete or unspecified spontaneous abortion without complication: Secondary | ICD-10-CM

## 2024-02-19 ENCOUNTER — Other Ambulatory Visit

## 2024-02-19 DIAGNOSIS — O039 Complete or unspecified spontaneous abortion without complication: Secondary | ICD-10-CM | POA: Diagnosis not present

## 2024-02-19 LAB — HCG, QUANTITATIVE, PREGNANCY: HCG, Total, QN: 5 m[IU]/mL

## 2024-02-20 ENCOUNTER — Ambulatory Visit: Payer: Self-pay | Admitting: Obstetrics and Gynecology

## 2024-03-06 ENCOUNTER — Telehealth

## 2024-03-13 ENCOUNTER — Encounter: Payer: Self-pay | Admitting: Obstetrics and Gynecology

## 2024-03-20 ENCOUNTER — Other Ambulatory Visit: Payer: Self-pay

## 2024-03-20 ENCOUNTER — Other Ambulatory Visit (HOSPITAL_COMMUNITY): Payer: Self-pay

## 2024-04-25 ENCOUNTER — Other Ambulatory Visit (HOSPITAL_COMMUNITY): Payer: Self-pay

## 2024-04-25 MED ORDER — FLUZONE 0.5 ML IM SUSY
0.5000 mL | PREFILLED_SYRINGE | Freq: Once | INTRAMUSCULAR | 0 refills | Status: AC
  Filled 2024-04-25: qty 0.5, 1d supply, fill #0

## 2024-04-29 ENCOUNTER — Encounter: Payer: Self-pay | Admitting: Dermatology

## 2024-04-29 ENCOUNTER — Other Ambulatory Visit (HOSPITAL_COMMUNITY): Payer: Self-pay

## 2024-04-29 ENCOUNTER — Ambulatory Visit: Admitting: Dermatology

## 2024-04-29 DIAGNOSIS — L810 Postinflammatory hyperpigmentation: Secondary | ICD-10-CM

## 2024-04-29 DIAGNOSIS — L700 Acne vulgaris: Secondary | ICD-10-CM | POA: Diagnosis not present

## 2024-04-29 MED ORDER — SPIRONOLACTONE 100 MG PO TABS
100.0000 mg | ORAL_TABLET | Freq: Every day | ORAL | 4 refills | Status: AC
  Filled 2024-04-29: qty 30, 30d supply, fill #0

## 2024-04-29 MED ORDER — CLINDAMYCIN PHOSPHATE 1 % EX SWAB
1.0000 | Freq: Every day | CUTANEOUS | 4 refills | Status: AC
  Filled 2024-04-29 – 2024-05-15 (×2): qty 60, 60d supply, fill #0
  Filled 2024-07-24: qty 60, 60d supply, fill #1

## 2024-04-29 NOTE — Progress Notes (Signed)
   Follow-Up Visit   Subjective  Connie Cobb is a 33 y.o. female established patient who presents for FOLLOW UP on the diagnoses listed below:  Patient was last evaluated on 02/07/24.   Acne: -Increase spironolactone  to 100 mg PO daily at nighttime. -Continue clindamycin  swabs in the morning -Restart tretinoin  0.025% 2 nights per week, gradually increasing to every other night.Currently still doing 2 nights a week.  -Recommended Avene Cicalfate & Eucerin RT serum. Pt doesn't believe she has samples remaining.   Patient reports sxs are better. She has noticed improvement in texture and pigment.    Are you nursing, pregnant or trying to conceive? No   The following portions of the chart were reviewed this encounter and updated as appropriate: medications, allergies, medical history  Review of Systems:  No other skin or systemic complaints except as noted in HPI or Assessment and Plan.  Objective  Well appearing patient in no apparent distress; mood and affect are within normal limits.   A focused examination was performed of the following areas: face    Relevant exam findings are noted in the Assessment and Plan.          Assessment & Plan    Acne and postinflammatory hyperpigmentation Persistent postinflammatory hyperpigmentation is present, but the area is now flat. Tretinoin  is well tolerated.  - Continue tretinoin  0.025% two nights a week. - Introduce Eucerin Radiant Tone with thiaminidol twice daily to target pigmentation. - Continue clindamycin  in the morning. - Continue spironolactone  for acne prevention. - Use sunscreen when exposed to active sun. - Provide samples of Eucerin Radiant Tone and Cicalfate moisturizer. - Refill clindamycin  and spironolactone  with a three-month supply and three refills. - Plan to increase tretinoin  to 0.05% in May if tolerated.     No follow-ups on file.   Documentation: I have reviewed the above documentation for accuracy  and completeness, and I agree with the above.  I, Shirron Maranda, CMA, am acting as scribe for [employer], DO.   Delon Lenis, DO

## 2024-05-05 ENCOUNTER — Other Ambulatory Visit (HOSPITAL_COMMUNITY): Payer: Self-pay

## 2024-05-05 MED ORDER — SODIUM FLUORIDE 1.1 % DT CREA
TOPICAL_CREAM | Freq: Every day | DENTAL | 2 refills | Status: AC
  Filled 2024-05-05: qty 51, 15d supply, fill #0

## 2024-05-06 ENCOUNTER — Ambulatory Visit: Admitting: Dermatology

## 2024-05-08 ENCOUNTER — Other Ambulatory Visit (HOSPITAL_COMMUNITY): Payer: Self-pay

## 2024-05-13 DIAGNOSIS — H52223 Regular astigmatism, bilateral: Secondary | ICD-10-CM | POA: Diagnosis not present

## 2024-05-15 ENCOUNTER — Other Ambulatory Visit (HOSPITAL_COMMUNITY): Payer: Self-pay

## 2024-06-24 ENCOUNTER — Other Ambulatory Visit (HOSPITAL_COMMUNITY): Payer: Self-pay

## 2024-06-24 ENCOUNTER — Telehealth: Payer: Self-pay

## 2024-06-24 DIAGNOSIS — G43009 Migraine without aura, not intractable, without status migrainosus: Secondary | ICD-10-CM

## 2024-06-24 MED ORDER — RIZATRIPTAN BENZOATE 5 MG PO TABS
5.0000 mg | ORAL_TABLET | ORAL | 0 refills | Status: AC | PRN
  Filled 2024-06-24: qty 10, 30d supply, fill #0

## 2024-06-24 NOTE — Telephone Encounter (Signed)
 Patient called to refill Migraine medication, medication has been sent to pharmacy, patient is aware

## 2024-11-27 ENCOUNTER — Ambulatory Visit: Admitting: Dermatology

## 2025-01-12 ENCOUNTER — Ambulatory Visit: Admitting: Obstetrics and Gynecology

## 2025-02-09 ENCOUNTER — Encounter: Admitting: Family Medicine
